# Patient Record
Sex: Female | Born: 1980
Health system: Southern US, Community
[De-identification: ages and names within clinical notes are randomized; demographics above are authoritative.]

## PROBLEM LIST (undated history)

## (undated) DIAGNOSIS — Z8614 Personal history of Methicillin resistant Staphylococcus aureus infection: Secondary | ICD-10-CM

## (undated) DIAGNOSIS — R112 Nausea with vomiting, unspecified: Secondary | ICD-10-CM

## (undated) DIAGNOSIS — Z9889 Other specified postprocedural states: Secondary | ICD-10-CM

## (undated) DIAGNOSIS — K3184 Gastroparesis: Secondary | ICD-10-CM

## (undated) DIAGNOSIS — G43909 Migraine, unspecified, not intractable, without status migrainosus: Secondary | ICD-10-CM

## (undated) DIAGNOSIS — K589 Irritable bowel syndrome without diarrhea: Secondary | ICD-10-CM

## (undated) DIAGNOSIS — K219 Gastro-esophageal reflux disease without esophagitis: Secondary | ICD-10-CM

## (undated) DIAGNOSIS — J45909 Unspecified asthma, uncomplicated: Secondary | ICD-10-CM

## (undated) HISTORY — DX: Unspecified asthma, uncomplicated: J45.909

## (undated) HISTORY — DX: Irritable bowel syndrome, unspecified: K58.9

## (undated) HISTORY — DX: Gastro-esophageal reflux disease without esophagitis: K21.9

## (undated) HISTORY — DX: Gastroparesis: K31.84

## (undated) HISTORY — DX: Personal history of Methicillin resistant Staphylococcus aureus infection: Z86.14

## (undated) HISTORY — DX: Migraine, unspecified, not intractable, without status migrainosus: G43.909

---

## 1999-07-21 ENCOUNTER — Emergency Department (HOSPITAL_COMMUNITY): Admission: EM | Admit: 1999-07-21 | Discharge: 1999-07-21 | Payer: Self-pay | Admitting: Emergency Medicine

## 1999-07-22 ENCOUNTER — Encounter: Payer: Self-pay | Admitting: Emergency Medicine

## 2001-02-22 ENCOUNTER — Emergency Department (HOSPITAL_COMMUNITY): Admission: EM | Admit: 2001-02-22 | Discharge: 2001-02-22 | Payer: Self-pay | Admitting: Emergency Medicine

## 2001-02-22 ENCOUNTER — Encounter: Payer: Self-pay | Admitting: Emergency Medicine

## 2001-02-23 ENCOUNTER — Ambulatory Visit (HOSPITAL_COMMUNITY): Admission: RE | Admit: 2001-02-23 | Discharge: 2001-02-23 | Payer: Self-pay | Admitting: Internal Medicine

## 2005-05-13 HISTORY — PX: GALLBLADDER SURGERY: SHX652

## 2007-10-29 ENCOUNTER — Emergency Department (HOSPITAL_COMMUNITY): Admission: EM | Admit: 2007-10-29 | Discharge: 2007-10-29 | Payer: Self-pay | Admitting: Emergency Medicine

## 2008-04-11 ENCOUNTER — Emergency Department (HOSPITAL_COMMUNITY): Admission: EM | Admit: 2008-04-11 | Discharge: 2008-04-11 | Payer: Self-pay | Admitting: *Deleted

## 2009-03-14 ENCOUNTER — Inpatient Hospital Stay (HOSPITAL_COMMUNITY): Admission: AD | Admit: 2009-03-14 | Discharge: 2009-03-17 | Payer: Self-pay | Admitting: Obstetrics and Gynecology

## 2009-04-21 ENCOUNTER — Observation Stay (HOSPITAL_COMMUNITY): Admission: AD | Admit: 2009-04-21 | Discharge: 2009-04-22 | Payer: Self-pay | Admitting: Obstetrics & Gynecology

## 2009-04-21 ENCOUNTER — Ambulatory Visit: Payer: Self-pay | Admitting: Obstetrics and Gynecology

## 2009-05-03 ENCOUNTER — Inpatient Hospital Stay (HOSPITAL_COMMUNITY): Admission: AD | Admit: 2009-05-03 | Discharge: 2009-05-06 | Payer: Self-pay | Admitting: Obstetrics & Gynecology

## 2010-08-13 LAB — CBC
HCT: 24.9 % — ABNORMAL LOW (ref 36.0–46.0)
HCT: 27.5 % — ABNORMAL LOW (ref 36.0–46.0)
HCT: 32.2 % — ABNORMAL LOW (ref 36.0–46.0)
Hemoglobin: 10.7 g/dL — ABNORMAL LOW (ref 12.0–15.0)
Hemoglobin: 8.1 g/dL — ABNORMAL LOW (ref 12.0–15.0)
Hemoglobin: 9 g/dL — ABNORMAL LOW (ref 12.0–15.0)
MCHC: 32.8 g/dL (ref 30.0–36.0)
MCHC: 32.8 g/dL (ref 30.0–36.0)
MCHC: 33.2 g/dL (ref 30.0–36.0)
MCV: 84.9 fL (ref 78.0–100.0)
MCV: 85.2 fL (ref 78.0–100.0)
MCV: 85.3 fL (ref 78.0–100.0)
Platelets: 269 10*3/uL (ref 150–400)
Platelets: 298 10*3/uL (ref 150–400)
Platelets: 345 10*3/uL (ref 150–400)
RBC: 2.92 MIL/uL — ABNORMAL LOW (ref 3.87–5.11)
RBC: 3.23 MIL/uL — ABNORMAL LOW (ref 3.87–5.11)
RBC: 3.78 MIL/uL — ABNORMAL LOW (ref 3.87–5.11)
RDW: 14.5 % (ref 11.5–15.5)
RDW: 14.8 % (ref 11.5–15.5)
RDW: 15.2 % (ref 11.5–15.5)
WBC: 17.8 10*3/uL — ABNORMAL HIGH (ref 4.0–10.5)
WBC: 19.9 10*3/uL — ABNORMAL HIGH (ref 4.0–10.5)
WBC: 22.2 10*3/uL — ABNORMAL HIGH (ref 4.0–10.5)

## 2010-08-13 LAB — COMPREHENSIVE METABOLIC PANEL
ALT: 12 U/L (ref 0–35)
AST: 19 U/L (ref 0–37)
Albumin: 2.3 g/dL — ABNORMAL LOW (ref 3.5–5.2)
Alkaline Phosphatase: 109 U/L (ref 39–117)
BUN: 3 mg/dL — ABNORMAL LOW (ref 6–23)
CO2: 24 mEq/L (ref 19–32)
Calcium: 8.9 mg/dL (ref 8.4–10.5)
Chloride: 103 mEq/L (ref 96–112)
Creatinine, Ser: 0.67 mg/dL (ref 0.4–1.2)
GFR calc Af Amer: >60 mL/min (ref >60)
GFR calc non Af Amer: >60 mL/min (ref >60)
Glucose, Bld: 132 mg/dL — ABNORMAL HIGH (ref 70–99)
Potassium: 4.2 mEq/L (ref 3.5–5.1)
Sodium: 133 mEq/L — ABNORMAL LOW (ref 135–145)
Total Bilirubin: 0.3 mg/dL (ref 0.3–1.2)
Total Protein: 5.5 g/dL — ABNORMAL LOW (ref 6.0–8.3)

## 2010-08-13 LAB — LACTATE DEHYDROGENASE: LDH: 130 U/L (ref 94–250)

## 2010-08-13 LAB — RPR: RPR Ser Ql: NONREACTIVE

## 2010-08-13 LAB — GLUCOSE, CAPILLARY: Glucose-Capillary: 118 mg/dL — ABNORMAL HIGH (ref 70–99)

## 2010-08-13 LAB — URIC ACID: Uric Acid, Serum: 4.9 mg/dL (ref 2.4–7.0)

## 2010-08-14 LAB — URINALYSIS, DIPSTICK ONLY
Bilirubin Urine: NEGATIVE
Glucose, UA: NEGATIVE mg/dL
Hgb urine dipstick: NEGATIVE
Ketones, ur: NEGATIVE mg/dL
Nitrite: NEGATIVE
Protein, ur: NEGATIVE mg/dL
Specific Gravity, Urine: <1.005 — ABNORMAL LOW (ref 1.005–1.030)
Urobilinogen, UA: 0.2 mg/dL (ref 0.0–1.0)
pH: 6.5 (ref 5.0–8.0)

## 2010-08-14 LAB — COMPREHENSIVE METABOLIC PANEL
ALT: 14 U/L (ref 0–35)
AST: 16 U/L (ref 0–37)
Albumin: 2.7 g/dL — ABNORMAL LOW (ref 3.5–5.2)
Alkaline Phosphatase: 125 U/L — ABNORMAL HIGH (ref 39–117)
BUN: 4 mg/dL — ABNORMAL LOW (ref 6–23)
CO2: 16 mEq/L — ABNORMAL LOW (ref 19–32)
Calcium: 9.4 mg/dL (ref 8.4–10.5)
Chloride: 103 mEq/L (ref 96–112)
Creatinine, Ser: 0.55 mg/dL (ref 0.4–1.2)
GFR calc Af Amer: >60 mL/min (ref >60)
GFR calc non Af Amer: >60 mL/min (ref >60)
Glucose, Bld: 102 mg/dL — ABNORMAL HIGH (ref 70–99)
Potassium: 3.8 mEq/L (ref 3.5–5.1)
Sodium: 131 mEq/L — ABNORMAL LOW (ref 135–145)
Total Bilirubin: 0.4 mg/dL (ref 0.3–1.2)
Total Protein: 7.2 g/dL (ref 6.0–8.3)

## 2010-08-14 LAB — CBC
HCT: 34 % — ABNORMAL LOW (ref 36.0–46.0)
Hemoglobin: 11.2 g/dL — ABNORMAL LOW (ref 12.0–15.0)
MCHC: 33 g/dL (ref 30.0–36.0)
MCV: 86.3 fL (ref 78.0–100.0)
Platelets: 322 10*3/uL (ref 150–400)
RBC: 3.93 MIL/uL (ref 3.87–5.11)
RDW: 14.8 % (ref 11.5–15.5)
WBC: 14.1 10*3/uL — ABNORMAL HIGH (ref 4.0–10.5)

## 2010-08-14 LAB — URINALYSIS, ROUTINE W REFLEX MICROSCOPIC
Glucose, UA: NEGATIVE mg/dL
Ketones, ur: >80 mg/dL — AB
Nitrite: NEGATIVE
Protein, ur: NEGATIVE mg/dL
Specific Gravity, Urine: 1.02 (ref 1.005–1.030)
Urobilinogen, UA: 0.2 mg/dL (ref 0.0–1.0)
pH: 6.5 (ref 5.0–8.0)

## 2010-08-14 LAB — BASIC METABOLIC PANEL
BUN: 1 mg/dL — ABNORMAL LOW (ref 6–23)
CO2: 21 mEq/L (ref 19–32)
Calcium: 8.6 mg/dL (ref 8.4–10.5)
Chloride: 109 mEq/L (ref 96–112)
Creatinine, Ser: 0.51 mg/dL (ref 0.4–1.2)
GFR calc Af Amer: >60 mL/min (ref >60)
GFR calc non Af Amer: >60 mL/min (ref >60)
Glucose, Bld: 110 mg/dL — ABNORMAL HIGH (ref 70–99)
Potassium: 3.5 mEq/L (ref 3.5–5.1)
Sodium: 136 mEq/L (ref 135–145)

## 2010-08-14 LAB — AMYLASE: Amylase: 23 U/L (ref 0–105)

## 2010-08-14 LAB — URINE MICROSCOPIC-ADD ON

## 2010-08-14 LAB — LIPASE, BLOOD: Lipase: 19 U/L (ref 11–59)

## 2010-08-15 LAB — GRAM STAIN

## 2010-08-15 LAB — COMPREHENSIVE METABOLIC PANEL
ALT: 15 U/L (ref 0–35)
AST: 15 U/L (ref 0–37)
Albumin: 2.5 g/dL — ABNORMAL LOW (ref 3.5–5.2)
Alkaline Phosphatase: 109 U/L (ref 39–117)
BUN: 2 mg/dL — ABNORMAL LOW (ref 6–23)
CO2: 20 mEq/L (ref 19–32)
Calcium: 9 mg/dL (ref 8.4–10.5)
Chloride: 108 mEq/L (ref 96–112)
Creatinine, Ser: 0.39 mg/dL — ABNORMAL LOW (ref 0.4–1.2)
GFR calc Af Amer: >60 mL/min (ref >60)
GFR calc non Af Amer: >60 mL/min (ref >60)
Glucose, Bld: 82 mg/dL (ref 70–99)
Potassium: 3.3 mEq/L — ABNORMAL LOW (ref 3.5–5.1)
Sodium: 137 mEq/L (ref 135–145)
Total Bilirubin: 0.3 mg/dL (ref 0.3–1.2)
Total Protein: 6.7 g/dL (ref 6.0–8.3)

## 2010-08-15 LAB — CBC
HCT: 27.8 % — ABNORMAL LOW (ref 36.0–46.0)
HCT: 32.1 % — ABNORMAL LOW (ref 36.0–46.0)
Hemoglobin: 10.6 g/dL — ABNORMAL LOW (ref 12.0–15.0)
Hemoglobin: 9.4 g/dL — ABNORMAL LOW (ref 12.0–15.0)
MCHC: 33.1 g/dL (ref 30.0–36.0)
MCHC: 33.7 g/dL (ref 30.0–36.0)
MCV: 89.5 fL (ref 78.0–100.0)
MCV: 89.6 fL (ref 78.0–100.0)
Platelets: 266 10*3/uL (ref 150–400)
Platelets: 306 10*3/uL (ref 150–400)
RBC: 3.11 MIL/uL — ABNORMAL LOW (ref 3.87–5.11)
RBC: 3.59 MIL/uL — ABNORMAL LOW (ref 3.87–5.11)
RDW: 14.4 % (ref 11.5–15.5)
RDW: 14.8 % (ref 11.5–15.5)
WBC: 12.9 10*3/uL — ABNORMAL HIGH (ref 4.0–10.5)
WBC: 17.4 10*3/uL — ABNORMAL HIGH (ref 4.0–10.5)

## 2010-08-15 LAB — URINALYSIS, ROUTINE W REFLEX MICROSCOPIC
Glucose, UA: NEGATIVE mg/dL
Ketones, ur: >80 mg/dL — AB
Leukocytes, UA: NEGATIVE
Nitrite: NEGATIVE
Protein, ur: 30 mg/dL — AB
Specific Gravity, Urine: 1.025 (ref 1.005–1.030)
Urobilinogen, UA: 1 mg/dL (ref 0.0–1.0)
pH: 6.5 (ref 5.0–8.0)

## 2010-08-15 LAB — TSH: TSH: 0.185 u[IU]/mL — ABNORMAL LOW (ref 0.350–4.500)

## 2010-08-15 LAB — URINALYSIS, DIPSTICK ONLY
Bilirubin Urine: NEGATIVE
Glucose, UA: NEGATIVE mg/dL
Hgb urine dipstick: NEGATIVE
Ketones, ur: 15 mg/dL — AB
Leukocytes, UA: NEGATIVE
Nitrite: NEGATIVE
Protein, ur: NEGATIVE mg/dL
Specific Gravity, Urine: 1.01 (ref 1.005–1.030)
Urobilinogen, UA: 1 mg/dL (ref 0.0–1.0)
pH: 7 (ref 5.0–8.0)

## 2010-08-15 LAB — URINE MICROSCOPIC-ADD ON

## 2010-08-15 LAB — VANCOMYCIN, PEAK: Vancomycin Pk: 22.5 ug/mL (ref 20–40)

## 2010-08-15 LAB — LIPASE, BLOOD: Lipase: 11 U/L (ref 11–59)

## 2010-08-15 LAB — AMYLASE: Amylase: 19 U/L — ABNORMAL LOW (ref 27–131)

## 2010-08-15 LAB — VANCOMYCIN, TROUGH: Vancomycin Tr: 5.8 ug/mL — ABNORMAL LOW (ref 10.0–20.0)

## 2010-08-15 LAB — T4, FREE: Free T4: 1.17 ng/dL (ref 0.80–1.80)

## 2010-09-28 NOTE — Procedures (Signed)
[facility]  Patient:    Megan Duran, Megan Duran Visit Number: [id number] MRN: [id number]          Service Type: END Location: ENDO Attending Physician:  Mervin Hack Dictated by:   Hedwig Morton. Juanda Chance, M.D. [facility] Admit Date:  02/23/2001   CC:         Gwen Pounds, M.D.   Procedure Report  PROCEDURE:  Upper endoscopy.  INDICATIONS:  This 30 year old white female presented to the emergency room last night with intractable nausea and vomiting.  She was previously hospitalized in [facility] last week for the same symptoms.  A CT scan of the abdomen was essentially negative.  She has been on Vioxx 25 mg a day. Her stool was Hemoccult negative and all of her chemistries were normal.  An ultrasound of the gallbladder was also normal.  She has been under a great deal of stress.  It is not clear whether this represents a functional GI problem or whether she has actually NSAID-related gastropathy or even peptic ulcer disease.  Therefore, she is undergoing upper endoscopy to assist with gastric outlet obstruction.  ENDOSCOPE:  Olympus single-channel video endoscope.  SEDATION:  Versed 5 mg IV and Demerol 40 mg IV.  FINDINGS:  Esophagus:  The Olympus single-channel video endoscope was passed under direct vision through the posterior pharynx into the esophagus.  The patient was monitored by pulse oximeter.  Oxygen saturations were normal.  She was quite cooperative.  Proximal and mixed esophageal mucosa were unremarkable.  The squamocolumnar junction was normal with mild erythema on the gastric side of the GE junction.  There was a small hiatal hernia measuring 2 cm, which was easily reducible.  Stomach:  The stomach was insufflated with air and showed patchy erythema in the gastric fundus most likely related to repeated vomiting.  There was no evidence of Mallory-Weiss tear, but the mucosa was patchy and showed inflammatory changes.  A CLOtest was  taken from the gastric antrum, which appeared normal.  The pyloric outlet was unremarkable.  There was no retained food or blood in the stomach.  Duodenum:  The duodenal bulb and descending duodenum were normal.  The endoscope scope was then retracted back to the stomach and retroflexed where the patchy erythema was confirmed by retroflexing the endoscope in new position.  IMPRESSION: 1. Gastritis, status post CLOtest. 2. Small reducible hiatal hernia.  PLAN: 1. Treat conservatively with bowel rest using full liquids. 2. Aciphex 20 mg p.o. b.i.d. x 1 week and then one p.o. q.d. 3. Donnatal tablets one p.o. b.i.d. 4. Continue Phenergan 12.5 mg prior to meals p.r.n. nausea. 5. She also has Lortab one p.o. p.r.n. severe abdominal pain. 6. If her symptoms continue, will schedule a small bowel follow through to    rule out small bowel obstruction or Crohns disease. Dictated by:   Hedwig Morton. Juanda Chance, M.D. [facility] Attending Physician:  Mervin Hack DD:  02/23/01 TD:  02/23/01 Job: [id number]

## 2011-06-10 ENCOUNTER — Telehealth: Payer: Self-pay | Admitting: *Deleted

## 2011-06-10 NOTE — Telephone Encounter (Signed)
Pt informed of lab results. 

## 2014-04-05 ENCOUNTER — Other Ambulatory Visit: Payer: Self-pay | Admitting: Allergy and Immunology

## 2014-04-05 DIAGNOSIS — J329 Chronic sinusitis, unspecified: Secondary | ICD-10-CM

## 2014-04-06 ENCOUNTER — Encounter (HOSPITAL_COMMUNITY): Payer: Self-pay

## 2014-04-06 ENCOUNTER — Ambulatory Visit (HOSPITAL_COMMUNITY)
Admission: RE | Admit: 2014-04-06 | Discharge: 2014-04-06 | Disposition: A | Discharge status: Home / Self Care | Payer: 59 | Source: Ambulatory Visit | Attending: Allergy and Immunology | Admitting: Allergy and Immunology

## 2014-04-06 DIAGNOSIS — J329 Chronic sinusitis, unspecified: Secondary | ICD-10-CM | POA: Insufficient documentation

## 2015-08-07 DIAGNOSIS — J209 Acute bronchitis, unspecified: Secondary | ICD-10-CM | POA: Diagnosis not present

## 2015-08-07 DIAGNOSIS — J0100 Acute maxillary sinusitis, unspecified: Secondary | ICD-10-CM | POA: Diagnosis not present

## 2015-08-07 DIAGNOSIS — R51 Headache: Secondary | ICD-10-CM | POA: Diagnosis not present

## 2015-08-10 ENCOUNTER — Encounter: Payer: Self-pay | Admitting: Allergy and Immunology

## 2015-08-10 ENCOUNTER — Ambulatory Visit (INDEPENDENT_AMBULATORY_CARE_PROVIDER_SITE_OTHER): Payer: 59 | Admitting: Allergy and Immunology

## 2015-08-10 VITALS — BP 122/84 | HR 76 | Temp 98.4°F | Resp 16 | Ht 65.35 in | Wt 240.7 lb

## 2015-08-10 DIAGNOSIS — K219 Gastro-esophageal reflux disease without esophagitis: Secondary | ICD-10-CM

## 2015-08-10 DIAGNOSIS — J309 Allergic rhinitis, unspecified: Secondary | ICD-10-CM

## 2015-08-10 DIAGNOSIS — J019 Acute sinusitis, unspecified: Secondary | ICD-10-CM

## 2015-08-10 DIAGNOSIS — J4541 Moderate persistent asthma with (acute) exacerbation: Secondary | ICD-10-CM | POA: Diagnosis not present

## 2015-08-10 DIAGNOSIS — J387 Other diseases of larynx: Secondary | ICD-10-CM

## 2015-08-10 DIAGNOSIS — H1010 Acute atopic conjunctivitis, unspecified eye: Secondary | ICD-10-CM

## 2015-08-10 MED ORDER — FLUTICASONE PROPIONATE 50 MCG/ACT NA SUSP: 1.0000 | Freq: Two times a day (BID) | NASAL | Status: DC

## 2015-08-10 MED ORDER — METHYLPREDNISOLONE ACETATE 80 MG/ML IJ SUSP
80.0000 mg | Freq: Once | INTRAMUSCULAR | Status: AC
  Administered 2015-08-10: 80 mg via INTRAMUSCULAR

## 2015-08-10 MED ORDER — AMOXICILLIN-POT CLAVULANATE 875-125 MG PO TABS: ORAL_TABLET | ORAL | Status: DC

## 2015-08-10 MED ORDER — LEVALBUTEROL TARTRATE 45 MCG/ACT IN AERO: 2.0000 | INHALATION_SPRAY | Freq: Four times a day (QID) | RESPIRATORY_TRACT | Status: DC | PRN

## 2015-08-10 NOTE — Patient Instructions (Addendum)
  1. Depo-Medrol 80 IM delivered in clinic today  2. Augmentin 875 one tablet twice a day for the next 14 days  3. Nasal saline multiple times per day while sick  4. Continue Symbicort 160 2 inhalations twice a day  5. Continue Flonase one spray each nostril twice a day  6. Continue ranitidine 300 mg once a day   7. Continue Zyrtec and Xopenex if needed  8. Return to clinic in 6 months or earlier if problem

## 2015-08-10 NOTE — Progress Notes (Signed)
Follow-up Note  Referring Provider: Reggy EyeKozlow, Ayanna James, Demetrius CharityP* Primary Provider: Reggy EyeKOZLOW, AYANNA JAMES, PA-C Date of Office Visit: 08/10/2015  Subjective:   Megan Duran (DOB: 01/29/1981) is a 35 y.o. female [facility] in re-evaluation of the following:  HPI Comments: Megan Duran returns to this clinic in reevaluation of her asthma and allergic rhinitis and reflux-induced respiratory disease. I've not seen her in his clinic since July 2016.  During the interval she is done quite well with her asthma without any need to use a systemic steroid for an exacerbation, minimal requirement for a short acting bronchodilator, and no interference with ability to exercise, while consistently using her Symbicort 2 inhalations twice a day. As well, her nose is been doing quite well while using Flonase on a relatively consistent basis and her reflux been under pretty good control while using her Zantac daily.  Unfortunately, about 2 weeks ago she developed significant nasal congestion and ugly nasal discharge and anosmia and headache and now has developed cough and congestion in her chest. Her head issue is not improving. Apparently she was exposed to influenza from her daughter and she did use Tamiflu about 4-6 weeks ago.     Medication List           budesonide-formoterol 160-4.5 MCG/ACT inhaler  Commonly known as:  SYMBICORT  Inhale 2 puffs into the lungs 2 (two) times daily.     DAYQUIL MULTI-SYMPTOM COLD/FLU PO  Take by mouth as needed.     fluticasone 50 MCG/ACT nasal spray  Commonly known as:  FLONASE  Place 1 spray into both nostrils 2 (two) times daily.     levalbuterol 45 MCG/ACT inhaler  Commonly known as:  XOPENEX HFA  Inhale 2 puffs into the lungs every 6 (six) hours as needed for wheezing.     loratadine 10 MG tablet  Commonly known as:  CLARITIN  Take 10 mg by mouth daily as needed for allergies.     ranitidine 300 MG  tablet  Commonly known as:  ZANTAC  Take 300 mg by mouth daily.        Past Medical History  Diagnosis Date  . Asthma   . GERD (gastroesophageal reflux disease)   . Migraine   . IBS (irritable bowel syndrome)   . History of MRSA infection     Past Surgical History  Procedure Laterality Date  . Gallbladder surgery  2007    Allergies  Allergen Reactions  . Bactrim [Sulfamethoxazole-Trimethoprim] Hives  . Biaxin [Clarithromycin] Hives  . Doxycycline Hives  . Effexor [Venlafaxine] Hives  . Erythromycin Hives  . Imitrex [Sumatriptan] Hives  . Keflex [Cephalexin] Hives  . Minocycline Hives    Review of systems negative except as noted in HPI / PMHx or noted below:  Review of Systems  Constitutional: Negative.   HENT: Negative.   Eyes: Negative.   Respiratory: Negative.   Cardiovascular: Negative.   Gastrointestinal: Negative.   Genitourinary: Negative.   Musculoskeletal: Negative.   Skin: Negative.   Neurological: Negative.   Endo/Heme/Allergies: Negative.   Psychiatric/Behavioral: Negative.      Objective:   Filed Vitals:   08/10/15 1009  BP: 122/84  Pulse: 76  Temp: 98.4 F (36.9 C)  Resp: 16   Height: 5' 5.35" (166 cm)  Weight: 240 lb 11.9 oz (109.2 kg)   Physical Exam  Constitutional: She is well-developed, well-nourished, and in no distress.  Coughing, nasal voice  HENT:  Head: Normocephalic.  Right Ear: Tympanic membrane, external ear and ear canal normal.  Left Ear: Tympanic membrane, external ear and ear canal normal.  Nose: Mucosal edema (Erythematous) present. No rhinorrhea.  Mouth/Throat: Uvula is midline, oropharynx is clear and moist and mucous membranes are normal. No oropharyngeal exudate.  Eyes: Conjunctivae are normal.  Neck: Trachea normal. No tracheal tenderness present. No tracheal deviation present. No thyromegaly present.  Cardiovascular: Normal rate, regular rhythm, S1 normal, S2 normal and normal heart sounds.   No murmur  heard. Pulmonary/Chest: No stridor. No respiratory distress. She has wheezes (End expiratory wheezing on forced expiration bilaterally). She has no rales.  Musculoskeletal: She exhibits no edema.  Lymphadenopathy:       Head (right side): No tonsillar adenopathy present.       Head (left side): No tonsillar adenopathy present.    She has no cervical adenopathy.    She has no axillary adenopathy.  Neurological: She is alert. Gait normal.  Skin: No rash noted. She is not diaphoretic. No erythema. Nails show no clubbing.  Psychiatric: Mood and affect normal.    Diagnostics:    Spirometry was performed and demonstrated an FEV1 of 2.43 at 74 % of predicted.  The patient had an Asthma Control Test with the following results: ACT Total Score: 16.    Assessment and Plan:   1. Asthma, not well controlled, moderate persistent, with acute exacerbation   2. Allergic rhinoconjunctivitis   3. LPRD (laryngopharyngeal reflux disease)   4. Acute sinusitis, recurrence not specified, unspecified location     1. Depo-Medrol 80 IM delivered in clinic today  2. Augmentin 875 one tablet twice a day for the next 14 days  3. Nasal saline multiple times per day while sick  4. Continue Symbicort 162 inhalations twice a day  5. Continue Flonase one spray shot also twice a day  6. Continue ranitidine 300 mg once a day   7. Continue Zyrtec and Xopenex if needed  8. Return to clinic in 6 months or earlier if problem  I will assume that Daphyne will respond favorably to the medical therapy noted above which includes anti-inflammatory medications to get rid of her respiratory tract inflammation and antibiotics to treat what is probably a secondary bacterial infection from her influenza infection. We'll see if things go over the course of the next several weeks. She will keep in contact with me noting her response and will make a decision about how to proceed pending his response. If she does well I will  see her back in this clinic in 6 months or earlier if there is a problem.  Laurette Schimke, MD [facility]

## 2015-08-16 ENCOUNTER — Telehealth: Payer: Self-pay | Admitting: Allergy and Immunology

## 2015-08-16 NOTE — Telephone Encounter (Signed)
Please inform patient that instead of using zofran, stop zofran, let stomach settle, and we will get her another antibiotic when she is better. Not a good practice to continue to use an antibiotic that upsets gi tract.

## 2015-08-16 NOTE — Telephone Encounter (Signed)
Needs zofran called in to take with her antibiotics. She is getting sick on the antibiotics.  [facility] in [place]

## 2015-08-16 NOTE — Telephone Encounter (Signed)
Patient informed. 

## 2015-08-18 ENCOUNTER — Telehealth: Payer: Self-pay | Admitting: *Deleted

## 2015-08-18 NOTE — Telephone Encounter (Signed)
Patient is aware that Dr. Lucie LeatherKozlow is not in the office today but will be back Monday and will address this then.

## 2015-08-18 NOTE — Telephone Encounter (Signed)
PT STATED THAT DR.K REQUESTED THAT [name] CALL AND LET HIM KNOW WHEN HER STOMACH WAS FEELING BETTER. SHE SAYS HER STOMACH IS BETTER AND NOW SHE IS READY TO BE PUT BACK ON ANTIBIOTICS FOR BRONCHITIS. PATIENT STATES THAT A LOT OF ANTIBIOTICS WILL UPSET HER STOMACH AND IT IS NOT UNCOMMON FOR HER TO HAVE TO TAKE ZOFRAN ALONG WITH HER ANTIBIOTICS. PT WANTS TO KNOW IF YOU CAN CALL HER IN SOMETHING ALONG WITH ZOFRAN

## 2015-08-21 ENCOUNTER — Other Ambulatory Visit: Payer: Self-pay | Admitting: *Deleted

## 2015-08-21 ENCOUNTER — Ambulatory Visit: Payer: 59 | Admitting: Allergy and Immunology

## 2015-08-21 MED ORDER — ONDANSETRON 4 MG PREPACK (~~LOC~~): 1.0000 | ORAL_TABLET | Freq: Three times a day (TID) | ORAL | Status: DC | PRN

## 2015-08-21 MED ORDER — ONDANSETRON HCL 4 MG PO TABS: 4.0000 mg | ORAL_TABLET | Freq: Three times a day (TID) | ORAL | Status: DC | PRN

## 2015-08-21 MED ORDER — CLINDAMYCIN HCL 150 MG PO CAPS: 150.0000 mg | ORAL_CAPSULE | Freq: Three times a day (TID) | ORAL | Status: DC

## 2015-08-21 NOTE — Telephone Encounter (Signed)
PATIENT ADVISED OF RX AND INSTRUCTIONS, RX SENT

## 2015-08-21 NOTE — Telephone Encounter (Signed)
Please provide patient with clindamycin 150 tab three times a day for 14 days and zofran 4mg  tab every 8 hours as needed, # 30

## 2015-11-01 DIAGNOSIS — J0100 Acute maxillary sinusitis, unspecified: Secondary | ICD-10-CM | POA: Diagnosis not present

## 2015-12-20 DIAGNOSIS — K219 Gastro-esophageal reflux disease without esophagitis: Secondary | ICD-10-CM | POA: Diagnosis not present

## 2015-12-20 DIAGNOSIS — M159 Polyosteoarthritis, unspecified: Secondary | ICD-10-CM | POA: Insufficient documentation

## 2015-12-20 DIAGNOSIS — K589 Irritable bowel syndrome without diarrhea: Secondary | ICD-10-CM | POA: Insufficient documentation

## 2015-12-20 DIAGNOSIS — J45909 Unspecified asthma, uncomplicated: Secondary | ICD-10-CM | POA: Insufficient documentation

## 2015-12-20 DIAGNOSIS — D649 Anemia, unspecified: Secondary | ICD-10-CM | POA: Insufficient documentation

## 2015-12-20 DIAGNOSIS — K3184 Gastroparesis: Secondary | ICD-10-CM | POA: Insufficient documentation

## 2015-12-20 DIAGNOSIS — G43909 Migraine, unspecified, not intractable, without status migrainosus: Secondary | ICD-10-CM | POA: Insufficient documentation

## 2015-12-20 DIAGNOSIS — J329 Chronic sinusitis, unspecified: Secondary | ICD-10-CM | POA: Insufficient documentation

## 2015-12-20 DIAGNOSIS — G43919 Migraine, unspecified, intractable, without status migrainosus: Secondary | ICD-10-CM | POA: Diagnosis not present

## 2015-12-20 DIAGNOSIS — J0140 Acute pansinusitis, unspecified: Secondary | ICD-10-CM | POA: Diagnosis not present

## 2015-12-20 DIAGNOSIS — J4520 Mild intermittent asthma, uncomplicated: Secondary | ICD-10-CM | POA: Diagnosis not present

## 2015-12-20 DIAGNOSIS — R5383 Other fatigue: Secondary | ICD-10-CM | POA: Diagnosis not present

## 2015-12-20 DIAGNOSIS — R5381 Other malaise: Secondary | ICD-10-CM | POA: Diagnosis not present

## 2015-12-22 DIAGNOSIS — E559 Vitamin D deficiency, unspecified: Secondary | ICD-10-CM | POA: Insufficient documentation

## 2015-12-22 DIAGNOSIS — E611 Iron deficiency: Secondary | ICD-10-CM | POA: Insufficient documentation

## 2016-01-23 DIAGNOSIS — R51 Headache: Secondary | ICD-10-CM | POA: Diagnosis not present

## 2016-01-23 DIAGNOSIS — J209 Acute bronchitis, unspecified: Secondary | ICD-10-CM | POA: Diagnosis not present

## 2016-01-23 DIAGNOSIS — J0100 Acute maxillary sinusitis, unspecified: Secondary | ICD-10-CM | POA: Diagnosis not present

## 2016-02-01 DIAGNOSIS — J4520 Mild intermittent asthma, uncomplicated: Secondary | ICD-10-CM | POA: Diagnosis not present

## 2016-02-01 DIAGNOSIS — R0602 Shortness of breath: Secondary | ICD-10-CM | POA: Diagnosis not present

## 2016-02-01 DIAGNOSIS — E559 Vitamin D deficiency, unspecified: Secondary | ICD-10-CM | POA: Diagnosis not present

## 2016-02-01 DIAGNOSIS — R05 Cough: Secondary | ICD-10-CM | POA: Diagnosis not present

## 2016-02-01 DIAGNOSIS — R002 Palpitations: Secondary | ICD-10-CM | POA: Diagnosis not present

## 2016-02-01 DIAGNOSIS — E611 Iron deficiency: Secondary | ICD-10-CM | POA: Diagnosis not present

## 2016-02-01 DIAGNOSIS — R110 Nausea: Secondary | ICD-10-CM | POA: Diagnosis not present

## 2016-02-07 DIAGNOSIS — R000 Tachycardia, unspecified: Secondary | ICD-10-CM | POA: Diagnosis not present

## 2016-03-01 DIAGNOSIS — R002 Palpitations: Secondary | ICD-10-CM | POA: Diagnosis not present

## 2016-03-04 DIAGNOSIS — R002 Palpitations: Secondary | ICD-10-CM | POA: Diagnosis not present

## 2016-03-11 DIAGNOSIS — Z124 Encounter for screening for malignant neoplasm of cervix: Secondary | ICD-10-CM | POA: Diagnosis not present

## 2016-03-11 DIAGNOSIS — Z01419 Encounter for gynecological examination (general) (routine) without abnormal findings: Secondary | ICD-10-CM | POA: Diagnosis not present

## 2016-03-11 DIAGNOSIS — Z6841 Body Mass Index (BMI) 40.0 and over, adult: Secondary | ICD-10-CM | POA: Diagnosis not present

## 2016-03-11 DIAGNOSIS — N926 Irregular menstruation, unspecified: Secondary | ICD-10-CM | POA: Diagnosis not present

## 2016-03-25 DIAGNOSIS — N926 Irregular menstruation, unspecified: Secondary | ICD-10-CM | POA: Diagnosis not present

## 2016-03-25 DIAGNOSIS — Z1231 Encounter for screening mammogram for malignant neoplasm of breast: Secondary | ICD-10-CM | POA: Diagnosis not present

## 2016-04-08 DIAGNOSIS — J0100 Acute maxillary sinusitis, unspecified: Secondary | ICD-10-CM | POA: Diagnosis not present

## 2016-04-08 DIAGNOSIS — H6692 Otitis media, unspecified, left ear: Secondary | ICD-10-CM | POA: Diagnosis not present

## 2016-05-13 NOTE — L&D Delivery Note (Signed)
Patient was C/C/+2 and pushed for approx 15 minutes with epidural.   NSVD  female infant, Apgars 8/9, weight pending.   The patient had 2nd degree perineal laceration and Right labial tear repaired with 2-0 vicryl. Fundus was firm. EBL was expected amount. Placenta was delivered intact. Vagina was clear.  Baby was vigorous and doing skin to skin with mother.  Megan Duran, Megan Duran

## 2016-06-02 DIAGNOSIS — J209 Acute bronchitis, unspecified: Secondary | ICD-10-CM | POA: Diagnosis not present

## 2016-06-19 DIAGNOSIS — Z3161 Procreative counseling and advice using natural family planning: Secondary | ICD-10-CM | POA: Diagnosis not present

## 2016-06-19 DIAGNOSIS — E288 Other ovarian dysfunction: Secondary | ICD-10-CM | POA: Diagnosis not present

## 2016-06-19 DIAGNOSIS — Z319 Encounter for procreative management, unspecified: Secondary | ICD-10-CM | POA: Diagnosis not present

## 2016-06-19 MED FILL — LETROZOLE 2.5 MG TABLET: 2.5 | 30 days supply | Qty: 15 | Fill #0

## 2016-06-19 MED FILL — OVIDREL 250 MCG/0.5 ML SYRG: 250 | 14 days supply | Qty: 1 | Fill #0

## 2016-06-20 DIAGNOSIS — O021 Missed abortion: Secondary | ICD-10-CM | POA: Diagnosis not present

## 2016-06-21 DIAGNOSIS — Z319 Encounter for procreative management, unspecified: Secondary | ICD-10-CM | POA: Diagnosis not present

## 2016-06-25 DIAGNOSIS — Z319 Encounter for procreative management, unspecified: Secondary | ICD-10-CM | POA: Diagnosis not present

## 2016-07-15 MED FILL — LETROZOLE 2.5 MG TABLET: 2.5 | 30 days supply | Qty: 15 | Fill #1

## 2016-07-15 MED FILL — OVIDREL 250 MCG/0.5 ML SYRG: 250 | 14 days supply | Qty: 1 | Fill #1

## 2016-07-23 DIAGNOSIS — Z319 Encounter for procreative management, unspecified: Secondary | ICD-10-CM | POA: Diagnosis not present

## 2016-07-25 DIAGNOSIS — E559 Vitamin D deficiency, unspecified: Secondary | ICD-10-CM | POA: Diagnosis not present

## 2016-07-25 DIAGNOSIS — J329 Chronic sinusitis, unspecified: Secondary | ICD-10-CM | POA: Diagnosis not present

## 2016-07-25 DIAGNOSIS — J0140 Acute pansinusitis, unspecified: Secondary | ICD-10-CM | POA: Diagnosis not present

## 2016-07-25 DIAGNOSIS — J4520 Mild intermittent asthma, uncomplicated: Secondary | ICD-10-CM | POA: Diagnosis not present

## 2016-07-25 DIAGNOSIS — E611 Iron deficiency: Secondary | ICD-10-CM | POA: Diagnosis not present

## 2016-08-19 DIAGNOSIS — Z3200 Encounter for pregnancy test, result unknown: Secondary | ICD-10-CM | POA: Diagnosis not present

## 2016-08-22 DIAGNOSIS — Z3200 Encounter for pregnancy test, result unknown: Secondary | ICD-10-CM | POA: Diagnosis not present

## 2016-09-04 DIAGNOSIS — O0900 Supervision of pregnancy with history of infertility, unspecified trimester: Secondary | ICD-10-CM | POA: Diagnosis not present

## 2016-09-17 DIAGNOSIS — Z3490 Encounter for supervision of normal pregnancy, unspecified, unspecified trimester: Secondary | ICD-10-CM | POA: Diagnosis not present

## 2016-09-17 DIAGNOSIS — N925 Other specified irregular menstruation: Secondary | ICD-10-CM | POA: Diagnosis not present

## 2016-09-17 DIAGNOSIS — O09521 Supervision of elderly multigravida, first trimester: Secondary | ICD-10-CM | POA: Diagnosis not present

## 2016-09-17 LAB — OB RESULTS CONSOLE RUBELLA ANTIBODY, IGM: Rubella: IMMUNE

## 2016-09-17 LAB — OB RESULTS CONSOLE ABO/RH: RH Type: POSITIVE

## 2016-09-17 LAB — OB RESULTS CONSOLE HIV ANTIBODY (ROUTINE TESTING): HIV: NONREACTIVE

## 2016-09-17 LAB — OB RESULTS CONSOLE ANTIBODY SCREEN: Antibody Screen: NEGATIVE

## 2016-09-17 LAB — OB RESULTS CONSOLE RPR: RPR: NONREACTIVE

## 2016-09-17 LAB — OB RESULTS CONSOLE GC/CHLAMYDIA
Chlamydia: NEGATIVE
Gonorrhea: NEGATIVE

## 2016-09-17 LAB — OB RESULTS CONSOLE HEPATITIS B SURFACE ANTIGEN: Hepatitis B Surface Ag: NEGATIVE

## 2016-09-20 DIAGNOSIS — J0100 Acute maxillary sinusitis, unspecified: Secondary | ICD-10-CM | POA: Diagnosis not present

## 2016-09-24 DIAGNOSIS — Z3490 Encounter for supervision of normal pregnancy, unspecified, unspecified trimester: Secondary | ICD-10-CM | POA: Insufficient documentation

## 2016-11-21 DIAGNOSIS — Z363 Encounter for antenatal screening for malformations: Secondary | ICD-10-CM | POA: Diagnosis not present

## 2016-12-24 DIAGNOSIS — Z362 Encounter for other antenatal screening follow-up: Secondary | ICD-10-CM | POA: Diagnosis not present

## 2016-12-24 DIAGNOSIS — Z369 Encounter for antenatal screening, unspecified: Secondary | ICD-10-CM | POA: Diagnosis not present

## 2017-01-08 DIAGNOSIS — J0100 Acute maxillary sinusitis, unspecified: Secondary | ICD-10-CM | POA: Diagnosis not present

## 2017-01-21 DIAGNOSIS — Z23 Encounter for immunization: Secondary | ICD-10-CM | POA: Diagnosis not present

## 2017-01-21 DIAGNOSIS — Z3480 Encounter for supervision of other normal pregnancy, unspecified trimester: Secondary | ICD-10-CM | POA: Diagnosis not present

## 2017-01-28 DIAGNOSIS — O99810 Abnormal glucose complicating pregnancy: Secondary | ICD-10-CM | POA: Diagnosis not present

## 2017-02-28 DIAGNOSIS — Z3483 Encounter for supervision of other normal pregnancy, third trimester: Secondary | ICD-10-CM | POA: Diagnosis not present

## 2017-02-28 DIAGNOSIS — Z3482 Encounter for supervision of other normal pregnancy, second trimester: Secondary | ICD-10-CM | POA: Diagnosis not present

## 2017-03-04 DIAGNOSIS — O26849 Uterine size-date discrepancy, unspecified trimester: Secondary | ICD-10-CM | POA: Diagnosis not present

## 2017-03-12 DIAGNOSIS — J0100 Acute maxillary sinusitis, unspecified: Secondary | ICD-10-CM | POA: Diagnosis not present

## 2017-03-19 DIAGNOSIS — Z3480 Encounter for supervision of other normal pregnancy, unspecified trimester: Secondary | ICD-10-CM | POA: Diagnosis not present

## 2017-03-19 DIAGNOSIS — Z369 Encounter for antenatal screening, unspecified: Secondary | ICD-10-CM | POA: Diagnosis not present

## 2017-03-19 LAB — OB RESULTS CONSOLE GBS: GBS: NEGATIVE

## 2017-03-27 DIAGNOSIS — O09523 Supervision of elderly multigravida, third trimester: Secondary | ICD-10-CM | POA: Diagnosis not present

## 2017-03-31 ENCOUNTER — Other Ambulatory Visit: Payer: Self-pay

## 2017-03-31 ENCOUNTER — Encounter (HOSPITAL_COMMUNITY): Payer: Self-pay | Admitting: Emergency Medicine

## 2017-03-31 ENCOUNTER — Inpatient Hospital Stay (HOSPITAL_COMMUNITY)
Admission: AD | Admit: 2017-03-31 | Discharge: 2017-04-03 | DRG: 807 | Disposition: A | Discharge status: Home / Self Care | Payer: 59 | Source: Ambulatory Visit | Attending: Obstetrics and Gynecology | Admitting: Obstetrics and Gynecology

## 2017-03-31 ENCOUNTER — Encounter (HOSPITAL_COMMUNITY): Payer: Self-pay | Admitting: *Deleted

## 2017-03-31 ENCOUNTER — Inpatient Hospital Stay (HOSPITAL_COMMUNITY)
Admission: AD | Admit: 2017-03-31 | Discharge: 2017-03-31 | Disposition: A | Discharge status: Home / Self Care | Payer: 59 | Source: Ambulatory Visit | Attending: Obstetrics and Gynecology | Admitting: Obstetrics and Gynecology

## 2017-03-31 DIAGNOSIS — Z3A37 37 weeks gestation of pregnancy: Secondary | ICD-10-CM

## 2017-03-31 DIAGNOSIS — O99214 Obesity complicating childbirth: Secondary | ICD-10-CM | POA: Diagnosis present

## 2017-03-31 DIAGNOSIS — O9962 Diseases of the digestive system complicating childbirth: Secondary | ICD-10-CM | POA: Diagnosis present

## 2017-03-31 DIAGNOSIS — Z3483 Encounter for supervision of other normal pregnancy, third trimester: Secondary | ICD-10-CM | POA: Diagnosis present

## 2017-03-31 DIAGNOSIS — K219 Gastro-esophageal reflux disease without esophagitis: Secondary | ICD-10-CM | POA: Diagnosis present

## 2017-03-31 DIAGNOSIS — O9952 Diseases of the respiratory system complicating childbirth: Principal | ICD-10-CM | POA: Diagnosis present

## 2017-03-31 DIAGNOSIS — O479 False labor, unspecified: Secondary | ICD-10-CM

## 2017-03-31 DIAGNOSIS — J45909 Unspecified asthma, uncomplicated: Secondary | ICD-10-CM | POA: Diagnosis present

## 2017-03-31 DIAGNOSIS — Z8614 Personal history of Methicillin resistant Staphylococcus aureus infection: Secondary | ICD-10-CM | POA: Diagnosis not present

## 2017-03-31 LAB — CBC
HCT: 34.5 % — ABNORMAL LOW (ref 36.0–46.0)
Hemoglobin: 11 g/dL — ABNORMAL LOW (ref 12.0–15.0)
MCH: 29.2 pg (ref 26.0–34.0)
MCHC: 31.9 g/dL (ref 30.0–36.0)
MCV: 91.5 fL (ref 78.0–100.0)
Platelets: 226 10*3/uL (ref 150–400)
RBC: 3.77 MIL/uL — ABNORMAL LOW (ref 3.87–5.11)
RDW: 14.7 % (ref 11.5–15.5)
WBC: 12.2 10*3/uL — ABNORMAL HIGH (ref 4.0–10.5)

## 2017-03-31 LAB — POCT FERN TEST
POCT Fern Test: NEGATIVE
POCT Fern Test: POSITIVE

## 2017-03-31 LAB — TYPE AND SCREEN
ABO/RH(D): A POS
Antibody Screen: NEGATIVE

## 2017-03-31 MED ORDER — OXYCODONE-ACETAMINOPHEN 5-325 MG PO TABS: 2.0000 | ORAL_TABLET | ORAL | Status: DC | PRN

## 2017-03-31 MED ORDER — SOD CITRATE-CITRIC ACID 500-334 MG/5ML PO SOLN
30.0000 mL | ORAL | Status: DC | PRN
Start: 2017-03-31 — End: 2017-04-01

## 2017-03-31 MED ORDER — OXYTOCIN BOLUS FROM INFUSION
500.0000 mL | Freq: Once | INTRAVENOUS | Status: AC
  Administered 2017-04-01: 500 mL via INTRAVENOUS

## 2017-03-31 MED ORDER — LACTATED RINGERS IV SOLN
INTRAVENOUS | Status: DC
  Administered 2017-03-31: 22:00:00 via INTRAVENOUS

## 2017-03-31 MED ORDER — TERBUTALINE SULFATE 1 MG/ML IJ SOLN
0.2500 mg | Freq: Once | INTRAMUSCULAR | Status: DC | PRN
  Filled 2017-03-31: qty 1

## 2017-03-31 MED ORDER — OXYCODONE-ACETAMINOPHEN 5-325 MG PO TABS
1.0000 | ORAL_TABLET | ORAL | Status: DC | PRN
Start: 2017-03-31 — End: 2017-04-01

## 2017-03-31 MED ORDER — ACETAMINOPHEN 325 MG PO TABS: 650.0000 mg | ORAL_TABLET | ORAL | Status: DC | PRN

## 2017-03-31 MED ORDER — ONDANSETRON HCL 4 MG/2ML IJ SOLN: 4.0000 mg | Freq: Four times a day (QID) | INTRAMUSCULAR | Status: DC | PRN

## 2017-03-31 MED ORDER — FLEET ENEMA 7-19 GM/118ML RE ENEM: 1.0000 | ENEMA | RECTAL | Status: DC | PRN

## 2017-03-31 MED ORDER — LACTATED RINGERS IV SOLN: 500.0000 mL | INTRAVENOUS | Status: DC | PRN

## 2017-03-31 MED ORDER — OXYTOCIN 40 UNITS IN LACTATED RINGERS INFUSION - SIMPLE MED
1.0000 m[IU]/min | INTRAVENOUS | Status: DC
  Administered 2017-03-31: 2 m[IU]/min via INTRAVENOUS
  Filled 2017-03-31: qty 1000

## 2017-03-31 MED ORDER — OXYTOCIN 40 UNITS IN LACTATED RINGERS INFUSION - SIMPLE MED: 2.5000 [IU]/h | INTRAVENOUS | Status: DC

## 2017-03-31 MED ORDER — LIDOCAINE HCL (PF) 1 % IJ SOLN
30.0000 mL | INTRAMUSCULAR | Status: DC | PRN
  Administered 2017-04-01: 30 mL via SUBCUTANEOUS
  Filled 2017-03-31: qty 30

## 2017-03-31 NOTE — Progress Notes (Signed)
D/c instructions reviewed, given/signed. Pt denies ques/concerns.  D/c home in stable condition with husband.

## 2017-03-31 NOTE — Progress Notes (Signed)
Called Dr Claiborne Billingsallahan, report given re: pt complaint, background, reactive NST, & SVEs.  Orders rec'd to ask MAU provider to write d/c order.  Report given to Lunette StandsE Lawerence, CNM

## 2017-03-31 NOTE — MAU Note (Signed)
Pt presents with c/o ctxs every 20 minutes since last night.  Denies Vb.  Reports "gush" of fluid @ 1100, clear.  Reports +FM.

## 2017-03-31 NOTE — MAU Note (Signed)
Pt reports that her water broke tonight at 7:45pm-clear fluid with some blood. Reports contractions every 12 mins. Good fetal movement.

## 2017-04-01 ENCOUNTER — Inpatient Hospital Stay (HOSPITAL_COMMUNITY): Payer: 59 | Admitting: Anesthesiology

## 2017-04-01 ENCOUNTER — Encounter (HOSPITAL_COMMUNITY): Payer: Self-pay

## 2017-04-01 LAB — RPR: RPR Ser Ql: NONREACTIVE

## 2017-04-01 LAB — ABO/RH: ABO/RH(D): A POS

## 2017-04-01 MED ORDER — ZOLPIDEM TARTRATE 5 MG PO TABS: 5.0000 mg | ORAL_TABLET | Freq: Every evening | ORAL | Status: DC | PRN

## 2017-04-01 MED ORDER — TETANUS-DIPHTH-ACELL PERTUSSIS 5-2.5-18.5 LF-MCG/0.5 IM SUSP: 0.5000 mL | Freq: Once | INTRAMUSCULAR | Status: DC

## 2017-04-01 MED ORDER — SENNOSIDES-DOCUSATE SODIUM 8.6-50 MG PO TABS
2.0000 | ORAL_TABLET | ORAL | Status: DC
  Administered 2017-04-03: 2 via ORAL
  Filled 2017-04-01 (×2): qty 2

## 2017-04-01 MED ORDER — OXYCODONE-ACETAMINOPHEN 5-325 MG PO TABS: 2.0000 | ORAL_TABLET | ORAL | Status: DC | PRN

## 2017-04-01 MED ORDER — ONDANSETRON HCL 4 MG PO TABS
4.0000 mg | ORAL_TABLET | ORAL | Status: DC | PRN
  Administered 2017-04-01: 4 mg via ORAL
  Filled 2017-04-01: qty 1

## 2017-04-01 MED ORDER — LORATADINE 10 MG PO TABS: 10.0000 mg | ORAL_TABLET | Freq: Every day | ORAL | Status: DC | PRN

## 2017-04-01 MED ORDER — LACTATED RINGERS IV SOLN: 500.0000 mL | Freq: Once | INTRAVENOUS | Status: DC

## 2017-04-01 MED ORDER — EPHEDRINE 5 MG/ML INJ
10.0000 mg | INTRAVENOUS | Status: DC | PRN
  Filled 2017-04-01: qty 2

## 2017-04-01 MED ORDER — ACETAMINOPHEN 325 MG PO TABS
650.0000 mg | ORAL_TABLET | ORAL | Status: DC | PRN
  Administered 2017-04-01 – 2017-04-02 (×4): 650 mg via ORAL
  Filled 2017-04-01 (×4): qty 2

## 2017-04-01 MED ORDER — PHENYLEPHRINE 40 MCG/ML (10ML) SYRINGE FOR IV PUSH (FOR BLOOD PRESSURE SUPPORT)
80.0000 ug | PREFILLED_SYRINGE | INTRAVENOUS | Status: DC | PRN
  Filled 2017-04-01: qty 5

## 2017-04-01 MED ORDER — FAMOTIDINE 20 MG PO TABS
20.0000 mg | ORAL_TABLET | Freq: Every day | ORAL | Status: DC
  Administered 2017-04-01 – 2017-04-03 (×3): 20 mg via ORAL
  Filled 2017-04-01 (×3): qty 1

## 2017-04-01 MED ORDER — BENZOCAINE-MENTHOL 20-0.5 % EX AERO
1.0000 "application " | INHALATION_SPRAY | CUTANEOUS | Status: DC | PRN
  Administered 2017-04-01 – 2017-04-03 (×2): 1 via TOPICAL
  Filled 2017-04-01 (×2): qty 56

## 2017-04-01 MED ORDER — OXYCODONE-ACETAMINOPHEN 5-325 MG PO TABS: 1.0000 | ORAL_TABLET | ORAL | Status: DC | PRN

## 2017-04-01 MED ORDER — SIMETHICONE 80 MG PO CHEW: 80.0000 mg | CHEWABLE_TABLET | ORAL | Status: DC | PRN

## 2017-04-01 MED ORDER — IBUPROFEN 600 MG PO TABS
600.0000 mg | ORAL_TABLET | Freq: Four times a day (QID) | ORAL | Status: DC
  Administered 2017-04-01 – 2017-04-03 (×10): 600 mg via ORAL
  Filled 2017-04-01 (×10): qty 1

## 2017-04-01 MED ORDER — FLUTICASONE PROPIONATE 50 MCG/ACT NA SUSP
1.0000 | Freq: Every day | NASAL | Status: DC
  Administered 2017-04-01 – 2017-04-03 (×3): 1 via NASAL
  Filled 2017-04-01: qty 16

## 2017-04-01 MED ORDER — LIDOCAINE HCL (PF) 1 % IJ SOLN
INTRAMUSCULAR | Status: DC | PRN
  Administered 2017-04-01 (×2): 4 mL via EPIDURAL

## 2017-04-01 MED ORDER — DIPHENHYDRAMINE HCL 25 MG PO CAPS: 25.0000 mg | ORAL_CAPSULE | Freq: Four times a day (QID) | ORAL | Status: DC | PRN

## 2017-04-01 MED ORDER — DIPHENHYDRAMINE HCL 50 MG/ML IJ SOLN: 12.5000 mg | INTRAMUSCULAR | Status: DC | PRN

## 2017-04-01 MED ORDER — FENTANYL 2.5 MCG/ML BUPIVACAINE 1/10 % EPIDURAL INFUSION (WH - ANES)
14.0000 mL/h | INTRAMUSCULAR | Status: DC | PRN
  Administered 2017-04-01: 14 mL/h via EPIDURAL

## 2017-04-01 MED ORDER — BUTALBITAL-APAP-CAFFEINE 50-325-40 MG PO TABS
1.0000 | ORAL_TABLET | Freq: Four times a day (QID) | ORAL | Status: DC | PRN
  Administered 2017-04-01 – 2017-04-03 (×3): 1 via ORAL
  Filled 2017-04-01 (×3): qty 1

## 2017-04-01 MED ORDER — ONDANSETRON HCL 4 MG/2ML IJ SOLN: 4.0000 mg | INTRAMUSCULAR | Status: DC | PRN

## 2017-04-01 MED ORDER — PHENYLEPHRINE 40 MCG/ML (10ML) SYRINGE FOR IV PUSH (FOR BLOOD PRESSURE SUPPORT)
PREFILLED_SYRINGE | INTRAVENOUS | Status: AC
  Filled 2017-04-01: qty 20

## 2017-04-01 MED ORDER — DIBUCAINE 1 % RE OINT: 1.0000 "application " | TOPICAL_OINTMENT | RECTAL | Status: DC | PRN

## 2017-04-01 MED ORDER — WITCH HAZEL-GLYCERIN EX PADS: 1.0000 "application " | MEDICATED_PAD | CUTANEOUS | Status: DC | PRN

## 2017-04-01 MED ORDER — FENTANYL 2.5 MCG/ML BUPIVACAINE 1/10 % EPIDURAL INFUSION (WH - ANES)
INTRAMUSCULAR | Status: AC
  Filled 2017-04-01: qty 100

## 2017-04-01 MED ORDER — COCONUT OIL OIL
1.0000 "application " | TOPICAL_OIL | Status: DC | PRN
  Administered 2017-04-02: 1 via TOPICAL
  Filled 2017-04-01: qty 120

## 2017-04-01 MED ORDER — PRENATAL MULTIVITAMIN CH
1.0000 | ORAL_TABLET | Freq: Every day | ORAL | Status: DC
  Administered 2017-04-01 – 2017-04-03 (×3): 1 via ORAL
  Filled 2017-04-01 (×3): qty 1

## 2017-04-01 NOTE — Anesthesia Procedure Notes (Signed)
Epidural Patient location during procedure: OB Start time: 04/01/2017 12:37 AM  Staffing Anesthesiologist: Leonides GrillsEllender, Damieon Armendariz P, MD Performed: anesthesiologist   Preanesthetic Checklist Completed: patient identified, site marked, pre-op evaluation, timeout performed, IV checked, risks and benefits discussed and monitors and equipment checked  Epidural Patient position: sitting Prep: DuraPrep Patient monitoring: heart rate, cardiac monitor, continuous pulse ox and blood pressure Approach: midline Location: L4-L5 Injection technique: LOR air  Needle:  Needle type: Tuohy  Needle gauge: 17 G Needle length: 9 cm Needle insertion depth: 7 cm Catheter type: closed end flexible Catheter size: 19 Gauge Catheter at skin depth: 12 cm Test dose: negative and Other  Assessment Events: blood not aspirated, injection not painful, no injection resistance and negative IV test  Additional Notes Informed consent obtained prior to proceeding including risk of failure, 1% risk of PDPH, risk of minor discomfort and bruising. Discussed alternatives to epidural analgesia and patient desires to proceed.  Timeout performed pre-procedure verifying patient name, procedure, and platelet count.  Patient tolerated procedure well. Reason for block:procedure for pain

## 2017-04-01 NOTE — Lactation Note (Signed)
This note was copied from a baby's chart. Lactation Consultation Note  Patient Name: Megan Duran NWGNF'AToday's Date: 04/01/2017 Reason for consult: Follow-up assessment Baby at 15 hr of life. Mom reports baby is "still not latching". She thinks baby is too sleepy to eat. She has been able to use the DEBP and manually express colostrum by spoon. Mom denies breast or nipple pain. She feels like bf is going better now. She is aware of lactation services and will call if feedings do not pick up over the next 12 hr.    Maternal Data    Feeding    LATCH Score                   Interventions    Lactation Tools Discussed/Used     Consult Status Consult Status: Follow-up Date: 04/02/17 Follow-up type: In-patient    [name] 04/01/2017, 8:55 PM

## 2017-04-01 NOTE — Anesthesia Postprocedure Evaluation (Signed)
Anesthesia Post Note  Patient: Megan Duran  Procedure(s) Performed: AN AD HOC LABOR EPIDURAL     Patient location during evaluation: Mother Baby Anesthesia Type: Epidural Level of consciousness: awake Pain management: pain level controlled Vital Signs Assessment: post-procedure vital signs reviewed and stable Respiratory status: spontaneous breathing Cardiovascular status: stable Postop Assessment: no headache, no backache, spinal receding, patient able to bend at knees, no apparent nausea or vomiting and adequate PO intake Anesthetic complications: no    Last Vitals:  Vitals:   04/01/17 0750 04/01/17 0908  BP: 128/80 105/71  Pulse: 100 99  Resp: 18 20  Temp: 36.7 C 36.6 C  SpO2:      Last Pain:  Vitals:   04/01/17 0908  TempSrc: Oral  PainSc: 3    Pain Goal: Patients Stated Pain Goal: 0 (03/31/17 2051)               Fanny DanceMULLINS,Tristina Sahagian

## 2017-04-01 NOTE — Lactation Note (Signed)
This note was copied from a baby's chart. Lactation Consultation Note  Patient Name: Megan Duran Today's Date: 04/01/2017 Reason for consult: Initial assessment;Early term 37-38.6wks;1st time breastfeeding  Initial visit at 10 hours of age.  Mom reports recent feeding with NS for about 10 minutes.  Baby is asleep with mom STS. Mom has DEBP set up at bedside and has used once.   LC assisted mom with hand expression and applied small drops to baby's mouth.  Baby asleep and not waking. LC encouraged mom to pump with DEBP and keep baby STS. Mom aware to spoon feed or syringe feed if baby is not waking for feedings. Mom to call RN for LATCH score. [facility] resources given and discussed.  Encouraged to feed with early cues on demand.  Early newborn behavior discussed.  Hand expression demonstrated with colostrum visible.  Mom to call for assist as needed.     Maternal Data Has patient been taught Hand Expression?: Yes Does the patient have breastfeeding experience prior to this delivery?: No  Feeding Feeding Type: Breast Fed Length of feed: (on and off for 15 min)  LATCH Score Latch: Too sleepy or reluctant, no latch achieved, no sucking elicited.  Audible Swallowing: None  Type of Nipple: Flat  Comfort (Breast/Nipple): Soft / non-tender  Hold (Positioning): Assistance needed to correctly position infant at breast and maintain latch.  LATCH Score: 4  Interventions Interventions: Breast feeding basics reviewed;Skin to skin;Hand express  Lactation Tools Discussed/Used Tools: Nipple Shields Nipple shield size: 20 Initiated by:: induction of lactatin with NS and poor feeding   Consult Status Consult Status: Follow-up Date: 04/02/17 Follow-up type: In-patient    Franz DellJana Shoptaw 04/01/2017, 3:32 PM

## 2017-04-01 NOTE — Anesthesia Preprocedure Evaluation (Signed)
Anesthesia Evaluation  Patient identified by MRN, date of birth, ID band Patient awake    Reviewed: Allergy & Precautions, H&P , NPO status , Patient's Chart, lab work & pertinent test results  History of Anesthesia Complications Negative for: history of anesthetic complications  Airway Mallampati: II  TM Distance: >3 FB Neck ROM: full    Dental no notable dental hx. (+) Teeth Intact   Pulmonary asthma ,    Pulmonary exam normal breath sounds clear to auscultation       Cardiovascular negative cardio ROS Normal cardiovascular exam Rhythm:regular Rate:Normal     Neuro/Psych  Headaches, negative psych ROS   GI/Hepatic Neg liver ROS, GERD  ,IBS (irritable bowel syndrome)   Endo/Other  Morbid obesity  Renal/GU negative Renal ROS  negative genitourinary   Musculoskeletal   Abdominal (+) + obese,   Peds  Hematology negative hematology ROS (+)   Anesthesia Other Findings   Reproductive/Obstetrics (+) Pregnancy                             Anesthesia Physical Anesthesia Plan  ASA: III  Anesthesia Plan: Epidural   Post-op Pain Management:    Induction:   PONV Risk Score and Plan:   Airway Management Planned:   Additional Equipment:   Intra-op Plan:   Post-operative Plan:   Informed Consent: I have reviewed the patients History and Physical, chart, labs and discussed the procedure including the risks, benefits and alternatives for the proposed anesthesia with the patient or authorized representative who has indicated his/her understanding and acceptance.     Plan Discussed with:   Anesthesia Plan Comments:         Anesthesia Quick Evaluation

## 2017-04-01 NOTE — Progress Notes (Signed)
Post Partum Day 0 Subjective: no complaints and "sore, cramping"  Objective: Blood pressure 105/71, pulse 99, temperature 97.8 F (36.6 C), temperature source Oral, resp. rate 20, height 5\' 5"  (1.651 m), weight 116.6 kg (257 lb), last menstrual period 07/14/2016, SpO2 99 %, unknown if currently breastfeeding.  Physical Exam:  General: alert, cooperative and no distress Lochia: appropriate Uterine Fundus: firm DVT Evaluation: No evidence of DVT seen on physical exam.  Recent Labs    03/31/17 2138  HGB 11.0*  HCT 34.5*    Assessment/Plan: Continue routine post partum care   LOS: 1 day   Lyndol Vanderheiden STACIA 04/01/2017, 9:50 AM

## 2017-04-01 NOTE — H&P (Signed)
36 y.o. 613w2d  G2P1001 comes in c/o gush of fluid.  Was in MAU earlier today with contractions, was ruled out for rupture at that time and cervix remained unchanged so was discharged home.   Now fern+ with less frequent contractions. Otherwise has good fetal movement and no bleeding.  Past Medical History:  Diagnosis Date  . Asthma    only uses maintanence inhaler  . GERD (gastroesophageal reflux disease)    takes meds  . History of MRSA infection   . IBS (irritable bowel syndrome)   . Migraine     Past Surgical History:  Procedure Laterality Date  . GALLBLADDER SURGERY  2007    OB History  Gravida Para Term Preterm AB Living  2 1 1     1   SAB TAB Ectopic Multiple Live Births               # Outcome Date GA Lbr Len/2nd Weight Sex Delivery Anes PTL Lv  2 Current           1 Term 05/04/09    F Vag-Spont         Social History   Socioeconomic History  . Marital status: Married    Spouse name: Not on file  . Number of children: Not on file  . Years of education: Not on file  . Highest education level: Not on file  Social Needs  . Financial resource strain: Not on file  . Food insecurity - worry: Not on file  . Food insecurity - inability: Not on file  . Transportation needs - medical: Not on file  . Transportation needs - non-medical: Not on file  Occupational History  . Not on file  Tobacco Use  . Smoking status: Never Smoker  . Smokeless tobacco: Never Used  Substance and Sexual Activity  . Alcohol use: No    Alcohol/week: 0.0 oz    Frequency: Never  . Drug use: No  . Sexual activity: Yes  Other Topics Concern  . Not on file  Social History Narrative  . Not on file   Bactrim [sulfamethoxazole-trimethoprim]; Biaxin [clarithromycin]; Doxycycline; Effexor [venlafaxine]; Erythromycin; Imitrex [sumatriptan]; Keflex [cephalexin]; and Minocycline    Prenatal Transfer Tool  Maternal Diabetes: No Genetic Screening: Normal Maternal Ultrasounds/Referrals:  Normal Fetal Ultrasounds or other Referrals:  None Maternal Substance Abuse:  No Significant Maternal Medications:  None Significant Maternal Lab Results: Lab values include: Group B Strep negative  Other PNC: uncomplicated.    Vitals:   04/01/17 0529 04/01/17 0530  BP: 132/66 121/62  Pulse: (!) 117 (!) 115  Resp: 16 16  Temp:    SpO2:       Lungs/Cor:  NAD Abdomen:  soft, gravid Ex:  no cords, erythema SVE:  3.5/70/-2 at admission FHTs:  140, good STV, NST R Toco:  occ   A/P   Admitted with SROM  Contractions currently infrequent, pitocin 2x2 start  GBS Neg  Epidural as desired  Other routine care [place], [facility]

## 2017-04-02 LAB — CBC
HCT: 31.8 % — ABNORMAL LOW (ref 36.0–46.0)
Hemoglobin: 10.2 g/dL — ABNORMAL LOW (ref 12.0–15.0)
MCH: 29.6 pg (ref 26.0–34.0)
MCHC: 32.1 g/dL (ref 30.0–36.0)
MCV: 92.2 fL (ref 78.0–100.0)
Platelets: 189 10*3/uL (ref 150–400)
RBC: 3.45 MIL/uL — ABNORMAL LOW (ref 3.87–5.11)
RDW: 15 % (ref 11.5–15.5)
WBC: 10.4 10*3/uL (ref 4.0–10.5)

## 2017-04-02 NOTE — Lactation Note (Addendum)
This note was copied from a baby's chart. Lactation Consultation Note Baby 25 hrs old. Not BF hardly. Baby sleepy. Attempted to latch multiple times, mom concern baby needs to eat. Mom has used DEBP w/small amount of colostrum. Collected 1 ml. Taught spoon feeding. Gave 0.765ml colostrum. Baby so sleepy, not safe to feed. Needed much stimulation to swallow.  Mom has pendulous breast, Rt. flat nipple at the bottom end of breast.  Multiple times tried to stimulate baby to suck on gloved finger,. Had no interest at all! Placed STS to mom and attempted to latch w/NS, baby wouldn't suck.  Mom's nipple are red, slightly sore from first day. Mom states when the baby has latched, he didn't maintain the latch and wasn't productive.  Discussed supplementing w/colostrum first, then may have to use formula. Asked mom to discuss this w/MD. Explained to parents the baby is just 25 hrs. Old and they are often sleepy, baby has BF some, has had output, has been spitty. As new parents wants longer BF from baby. Discussed cluster feeding will come. Encouraged mom to hold baby STS and hand express. Patient Name: Megan Zerita BoersJennifer Kamp UJWJX'BToday's Date: 04/02/2017 Reason for consult: Follow-up assessment;Difficult latch;Early term 37-38.6wks   Maternal Data    Feeding Feeding Type: Breast Milk Length of feed: 0 min  LATCH Score Latch: Too sleepy or reluctant, no latch achieved, no sucking elicited.  Audible Swallowing: None  Type of Nipple: Flat  Comfort (Breast/Nipple): Filling, red/small blisters or bruises, mild/mod discomfort  Hold (Positioning): Full assist, staff holds infant at breast  LATCH Score: 2  Interventions Interventions: Breast feeding basics reviewed;Coconut oil;Assisted with latch;Breast compression;Shells;Skin to skin;Adjust position;Comfort gels;Breast massage;Support pillows;Hand pump;Hand express;Position options;DEBP;Pre-pump if needed;Expressed milk  Lactation Tools  Discussed/Used Tools: Shells;Pump;Coconut oil;Comfort gels;Nipple Shields Nipple shield size: 20 Shell Type: Inverted Breast pump type: Double-Electric Breast Pump WIC Program: No Pump Review: Setup, frequency, and cleaning;Milk Storage Initiated by:: RN Date initiated:: 04/01/17   Consult Status Consult Status: Follow-up Date: 04/02/17 Follow-up type: In-patient    Charyl DancerCARVER, Carols Clemence G 04/02/2017, 7:15 AM

## 2017-04-02 NOTE — Progress Notes (Signed)
Patient is doing well.  She is ambulating, voiding, tolerating PO.  Pain control is good.  Lochia is appropriate.  Difficulty with breastfeeding--baby not latching and not interested per pt report  Vitals:   04/01/17 0750 04/01/17 0908 04/01/17 1328 04/01/17 2130  BP: 128/80 105/71 118/70 118/64  Pulse: 100 99 97 95  Resp: 18 20 18 16   Temp: 98 F (36.7 C) 97.8 F (36.6 C) 97.6 F (36.4 C) 97.9 F (36.6 C)  TempSrc: Oral Oral Oral Oral  SpO2:    97%  Weight:      Height:        NAD Fundus firm Ext: 1+ edema b/l  Lab Results  Component Value Date   WBC 10.4 04/02/2017   HGB 10.2 (L) 04/02/2017   HCT 31.8 (L) 04/02/2017   MCV 92.2 04/02/2017   PLT 189 04/02/2017    --/--/A POS, A POS (11/19 2138)/RImmune  A/P 36 y.o. G2P2002 PPD#1. Routine care.   Desires circ, but patient reports pediatrician has requested that we defer at this time due to poor feeding.    [facility] LLCDYANNA GEFFEL [employer] CompanyCLARK

## 2017-04-02 NOTE — Lactation Note (Signed)
This note was copied from a baby's chart. Lactation Consultation Note  Patient Name: Megan Duran MVHQI'OToday's Date: 04/02/2017 Reason for consult: Follow-up assessment;Early term 4437-38.6wks  Baby 4829 hours old. MD request to see patient and set up an SNS. Mom had less than 1 ml of EBM at bedside, so assisted mom with hand expression and collected a total of 1.5 ml which parents gave using curve-tipped syringe and FOB's finger. Enc mom to continue to put the baby to breast with cues, and refitted mom with #24 NS. Enc parents to supplement baby after BF according to supplementation guidelines--which were given with review. Mom wanting to continue pumping and hand expressing to see if EBM volumes increase to 7 ml in the next few hours. Mom states that she intends to ask for formula if her volumes are not increasing with the next few feedings. Enc mom to continue to post-pump followed by hand expression, and discussed progression of milk coming to volume.   Mom has 5 [place] feeding system, with review and single SNS in room for larger volumes of supplementation. Discussed assessment, interventions and feeding plan with Morrie SheldonAshley, RN.   Maternal Data    Feeding Feeding Type: Breast Milk Length of feed: 5 min  LATCH Score Latch: Too sleepy or reluctant, no latch achieved, no sucking elicited.  Audible Swallowing: None  Type of Nipple: Flat  Comfort (Breast/Nipple): Filling, red/small blisters or bruises, mild/mod discomfort  Hold (Positioning): Full assist, staff holds infant at breast  LATCH Score: 2  Interventions Interventions: Breast feeding basics reviewed;Hand express;Pre-pump if needed;Expressed milk  Lactation Tools Discussed/Used Tools: Shells;Pump;Coconut oil;Nipple Shields;64F feeding tube / Syringe Nipple shield size: 24 Shell Type: Inverted Breast pump type: Double-Electric Breast Pump WIC Program: No Pump Review: Setup, frequency, and cleaning;Milk Storage Initiated  by:: RN Date initiated:: 04/01/17   Consult Status Consult Status: PRN Date: 04/02/17 Follow-up type: In-patient    Megan Duran 04/02/2017, 10:46 AM

## 2017-04-03 NOTE — Lactation Note (Addendum)
This note was copied from a baby's chart. Lactation Consultation Note Baby doing much better w/feedings. Parents are inserting formula into NS w/curve tip syring. Baby BF in football position STS. Baby looks increased jaundice since yesterday. 04/02/17. Praised parents, cont. What they are doing.  Noted mom's breast fuller. Discussed engorgement, prevention, and maintaining milk supply.  Encouraged parents to call for assistance if needed. Baby will be circumcised today. Prepared parents for sleepiness. Encouraged STS today while sleepy. While feeding, baby spit up curdled formula.  Parents would like to go home today. Will need f/u w/OP LC.                                                                                                                                                                                                                                                           Patient Name: Boy Zerita BoersJennifer Broce WUJWJ'XToday's Date: 04/03/2017 Reason for consult: Follow-up assessment   Maternal Data    Feeding Feeding Type: Formula  LATCH Score Latch: Grasps breast easily, tongue down, lips flanged, rhythmical sucking.  Audible Swallowing: Spontaneous and intermittent  Type of Nipple: Flat  Comfort (Breast/Nipple): Filling, red/small blisters or bruises, mild/mod discomfort  Hold (Positioning): Assistance needed to correctly position infant at breast and maintain latch.  LATCH Score: 7  Interventions Interventions: Assisted with latch;Breast compression;Skin to skin;Comfort gels;Breast massage;Support pillows;Hand pump;Hand express;Position options  Lactation Tools Discussed/Used Tools: Nipple Dorris CarnesShields;Pump Nipple shield size: 24 Shell Type: Inverted   Consult Status Consult Status: Follow-up Date: 04/03/17 Follow-up type: In-patient    Charyl DancerCARVER, Rhayne Chatwin G 04/03/2017, 6:10 AM

## 2017-04-03 NOTE — Lactation Note (Signed)
This note was copied from a baby's chart. Lactation Consultation Note  Patient Name: Boy Zerita BoersJennifer Rancourt Today's Date: 04/03/2017   Mom states baby is feeding well using the 24 mm nipple shield.  She is post pumping and obtaining 3-7 mls of colostrum.  Mom has a DEBP for home use.  Instructed to continue post pumping until milk supply is well established.  Lactation outpatient services and support information reviewed and encouraged prn.  Maternal Data    Feeding    LATCH Score                   Interventions    Lactation Tools Discussed/Used     Consult Status      Huston FoleyMOULDEN, Jemimah Cressy S 04/03/2017, 10:44 AM

## 2017-04-03 NOTE — Progress Notes (Signed)
PPD#2 Pt without complaints. Doing well VSSAF IMP/ Stable Plan/Will discharge

## 2017-04-03 NOTE — Discharge Summary (Signed)
Obstetric Discharge Summary Reason for Admission: rupture of membranes Prenatal Procedures: ultrasound Intrapartum Procedures: spontaneous vaginal delivery Postpartum Procedures: none Complications-Operative and Postpartum: 2nd degree perineal laceration Hemoglobin  Date Value Ref Range Status  04/02/2017 10.2 (L) 12.0 - 15.0 g/dL Final   HCT  Date Value Ref Range Status  04/02/2017 31.8 (L) 36.0 - 46.0 % Final    Physical Exam:  General: alert and cooperative Lochia: appropriate Uterine Fundus: firm  Discharge Diagnoses: Term Pregnancy-delivered  Discharge Information: Date: 04/03/2017 Activity: pelvic rest Diet: routine Medications: PNV and Ibuprofen Condition: stable Instructions: refer to practice specific booklet Discharge to: home Follow-up Information    Philip AspenCallahan, Sidney, DO. Schedule an appointment as soon as possible for a visit in 1 month(s).   Specialty:  Obstetrics and Gynecology Contact information: [address] [place] [address] [phone]           Newborn Data: Live born female  Birth Weight: 6 lb 15.5 oz (3161 g) APGAR: 8, 9  Newborn Delivery   Birth date/time:  04/01/2017 05:13:00 Delivery type:  Vaginal, Spontaneous     Home with mother.  Megan Duran E 04/03/2017, 10:58 AM

## 2017-04-18 DIAGNOSIS — G4733 Obstructive sleep apnea (adult) (pediatric): Secondary | ICD-10-CM | POA: Diagnosis not present

## 2017-04-18 DIAGNOSIS — Z3482 Encounter for supervision of other normal pregnancy, second trimester: Secondary | ICD-10-CM | POA: Diagnosis not present

## 2017-04-18 DIAGNOSIS — Z3483 Encounter for supervision of other normal pregnancy, third trimester: Secondary | ICD-10-CM | POA: Diagnosis not present

## 2017-05-20 DIAGNOSIS — Z3202 Encounter for pregnancy test, result negative: Secondary | ICD-10-CM | POA: Diagnosis not present

## 2017-05-20 DIAGNOSIS — Z30430 Encounter for insertion of intrauterine contraceptive device: Secondary | ICD-10-CM | POA: Diagnosis not present

## 2017-06-17 DIAGNOSIS — Z30431 Encounter for routine checking of intrauterine contraceptive device: Secondary | ICD-10-CM | POA: Diagnosis not present

## 2017-07-25 DIAGNOSIS — J0100 Acute maxillary sinusitis, unspecified: Secondary | ICD-10-CM | POA: Diagnosis not present

## 2017-08-26 DIAGNOSIS — J0100 Acute maxillary sinusitis, unspecified: Secondary | ICD-10-CM | POA: Diagnosis not present

## 2017-08-26 DIAGNOSIS — J209 Acute bronchitis, unspecified: Secondary | ICD-10-CM | POA: Diagnosis not present

## 2017-09-03 DIAGNOSIS — J329 Chronic sinusitis, unspecified: Secondary | ICD-10-CM | POA: Diagnosis not present

## 2017-09-03 DIAGNOSIS — R062 Wheezing: Secondary | ICD-10-CM | POA: Diagnosis not present

## 2017-09-03 DIAGNOSIS — J4521 Mild intermittent asthma with (acute) exacerbation: Secondary | ICD-10-CM | POA: Diagnosis not present

## 2017-09-03 DIAGNOSIS — M940 Chondrocostal junction syndrome [Tietze]: Secondary | ICD-10-CM | POA: Diagnosis not present

## 2018-02-18 DIAGNOSIS — J0100 Acute maxillary sinusitis, unspecified: Secondary | ICD-10-CM | POA: Diagnosis not present

## 2018-04-14 ENCOUNTER — Telehealth: Payer: 59 | Admitting: Physician Assistant

## 2018-04-14 DIAGNOSIS — J019 Acute sinusitis, unspecified: Secondary | ICD-10-CM | POA: Diagnosis not present

## 2018-04-14 DIAGNOSIS — B9689 Other specified bacterial agents as the cause of diseases classified elsewhere: Secondary | ICD-10-CM

## 2018-04-14 MED ORDER — AMOXICILLIN-POT CLAVULANATE 875-125 MG PO TABS: 1.0000 | ORAL_TABLET | Freq: Two times a day (BID) | ORAL | 0 refills | Status: AC

## 2018-04-14 NOTE — Progress Notes (Signed)

## 2018-04-22 DIAGNOSIS — J205 Acute bronchitis due to respiratory syncytial virus: Secondary | ICD-10-CM | POA: Diagnosis not present

## 2018-04-22 DIAGNOSIS — J0100 Acute maxillary sinusitis, unspecified: Secondary | ICD-10-CM | POA: Diagnosis not present

## 2018-06-15 ENCOUNTER — Encounter: Payer: Self-pay | Admitting: Physician Assistant

## 2018-06-15 ENCOUNTER — Telehealth: Payer: 59 | Admitting: Physician Assistant

## 2018-06-15 DIAGNOSIS — J329 Chronic sinusitis, unspecified: Secondary | ICD-10-CM

## 2018-06-15 NOTE — Progress Notes (Addendum)
Based on what you shared with me it looks like you have a  condition that should be evaluated in a face to face office visit.   You symptoms are typical of another sinus infection. However, on review of your records, you have had an Evisit about 2-3 months ago with a sinus infection. Recurrence of the sinus infection within this time frame warrant a face to face evaluation. This guarantees that you are best taken care of and that we are not missing any other causes.    NOTE: If you entered your credit card information for this eVisit, you will not be charged. You may see a "hold" on your card for the $30 but that hold will drop off and you will not have a charge processed.  If you are having a true medical emergency please call 911.  If you need an urgent face to face visit, [place] has four urgent care centers for your convenience.  If you need care fast and have a high deductible or no insurance consider:   [email or web address] to reserve your spot online an avoid wait times  [facility] [address], [address] [place], [place] [address] 8 am to 8 pm Monday-Friday 10 am to 4 pm Saturday-Sunday *Across the street from [employer]  [address] [place], [address] 8 am to 5 pm Monday-Friday * In the [facility] on the [facility]   The following sites will take your  insurance:  . [facility]  [phone] Get Driving Directions Find a Provider at this Location  [address] [place], [place] [address] . 10 am to 8 pm Monday-Friday . 12 pm to 8 pm Saturday-Sunday   . [facility]  [phone] Get Driving Directions Find a Provider at this Location  [address] [place], [place] [address] . 8 am to 8 pm Monday-Friday . 9 am to 6 pm Saturday . 11 am to 6 pm Sunday   . [facility] at [facility]  [phone] Get  Driving Directions  [address] [place], [place] [address] . 8 am to 8 pm Monday-Friday . 8 am to 4 pm Saturday-Sunday   Your e-visit answers were reviewed by a board certified advanced clinical practitioner to complete your personal care plan.  Thank you for using e-Visits.  I took 10 minutes to complete this note- SO

## 2018-08-26 ENCOUNTER — Other Ambulatory Visit: Payer: Self-pay | Admitting: Allergy and Immunology

## 2018-08-26 ENCOUNTER — Ambulatory Visit (INDEPENDENT_AMBULATORY_CARE_PROVIDER_SITE_OTHER): Payer: 59 | Admitting: Allergy and Immunology

## 2018-08-26 ENCOUNTER — Encounter: Payer: Self-pay | Admitting: Allergy and Immunology

## 2018-08-26 ENCOUNTER — Other Ambulatory Visit: Payer: Self-pay

## 2018-08-26 VITALS — BP 160/104 | HR 110 | Temp 98.6°F | Resp 18

## 2018-08-26 DIAGNOSIS — J3089 Other allergic rhinitis: Secondary | ICD-10-CM | POA: Diagnosis not present

## 2018-08-26 DIAGNOSIS — K219 Gastro-esophageal reflux disease without esophagitis: Secondary | ICD-10-CM

## 2018-08-26 DIAGNOSIS — J4540 Moderate persistent asthma, uncomplicated: Secondary | ICD-10-CM | POA: Diagnosis not present

## 2018-08-26 DIAGNOSIS — R030 Elevated blood-pressure reading, without diagnosis of hypertension: Secondary | ICD-10-CM

## 2018-08-26 MED ORDER — LEVALBUTEROL TARTRATE 45 MCG/ACT IN AERO: INHALATION_SPRAY | RESPIRATORY_TRACT | 0 refills | Status: DC

## 2018-08-26 MED ORDER — OMEPRAZOLE 20 MG PO CPDR: 20.0000 mg | DELAYED_RELEASE_CAPSULE | Freq: Every day | ORAL | 1 refills | Status: DC

## 2018-08-26 MED ORDER — CETIRIZINE HCL 10 MG PO TABS: ORAL_TABLET | ORAL | 1 refills | Status: DC

## 2018-08-26 MED ORDER — FLUTICASONE PROPIONATE 50 MCG/ACT NA SUSP: NASAL | 1 refills | Status: DC

## 2018-08-26 MED ORDER — SYMBICORT 160-4.5 MCG/ACT IN AERO: INHALATION_SPRAY | RESPIRATORY_TRACT | 1 refills | Status: DC

## 2018-08-26 MED FILL — LEVALBUTEROL TAR HFA 45MCG: 45 | 17 days supply | Qty: 15 | Fill #0

## 2018-08-26 MED FILL — FLUTICASONE PROP 50 MCG SPR: 50 | 30 days supply | Qty: 16 | Fill #0

## 2018-08-26 MED FILL — SYMBICORT 160-4.5 MCG INH: 160-4.5 | 30 days supply | Qty: 10 | Fill #0

## 2018-08-26 MED FILL — OMEPRAZOLE 20 MG CPDR: 20 | 90 days supply | Qty: 90 | Fill #0

## 2018-08-26 NOTE — Progress Notes (Signed)
[place] [facility] [place] [facility] [place] [facility] [place] [facility] [place]   Follow-up Note  Referring Provider: No ref. provider found Primary Provider: Krystal Clark, NP Date of Office Visit: 08/26/2018  Subjective:   Megan Duran (DOB: June 20, 1980) is a 38 y.o. female who [facility]2018 in re-evaluation of the following:  HPI: Corine returns to this clinic in reevaluation of asthma and allergic rhinitis and reflux induced respiratory disease.  I have not seen her in this clinic since 10 August 2015.  While utilizing a plan of anti-inflammatory therapy including the use of Symbicort and a nasal steroid and therapy directed against reflux she has really done well with her airway.  She does not think that she has received a systemic steroid or antibiotic in years for a respiratory tract issue.  Her requirement for short acting bronchodilator is minimal averaging out to less than a few times a month.  Currently she is using her Symbicort mostly 1 time per day but occasionally twice a day and she uses her Flonase just about every day.  Her reflux has been under excellent control currently using a proton pump inhibitor.  He did receive the flu vaccine this year.  Allergies as of 08/26/2018      Reactions   Bactrim [sulfamethoxazole-trimethoprim] Hives   Biaxin [clarithromycin] Hives   Doxycycline Hives   Effexor [venlafaxine] Hives   Erythromycin Hives   Imitrex [sumatriptan] Hives   Keflex [cephalexin] Hives   Minocycline Hives      Medication List      budesonide-formoterol 160-4.5 MCG/ACT inhaler Commonly known as:  SYMBICORT Inhale 2 puffs into the lungs 2 (two) times daily as needed (Shortness of breath, cold, congestion).   cetirizine 10 MG tablet Commonly known as:  ZYRTEC Take 10 mg by mouth daily.   Flonase 50 MCG/ACT nasal spray Generic drug:  fluticasone Place 1 spray into both nostrils daily.   levalbuterol 45  MCG/ACT inhaler Commonly known as:  Xopenex HFA Inhale 2 puffs into the lungs every 6 (six) hours as needed for wheezing.   omeprazole 20 MG capsule Commonly known as:  PRILOSEC Take 20 mg by mouth daily.   VITAMIN C PO Take by mouth.   VITAMIN D PO Take by mouth daily.       Past Medical History:  Diagnosis Date  . Asthma    only uses maintanence inhaler  . GERD (gastroesophageal reflux disease)    takes meds  . History of MRSA infection   . IBS (irritable bowel syndrome)   . Migraine     Past Surgical History:  Procedure Laterality Date  . GALLBLADDER SURGERY  2007    Review of systems negative except as noted in HPI / PMHx or noted below:  Review of Systems  Constitutional: Negative.   HENT: Negative.   Eyes: Negative.   Respiratory: Negative.   Cardiovascular: Negative.   Gastrointestinal: Negative.   Genitourinary: Negative.   Musculoskeletal: Negative.   Skin: Negative.   Neurological: Negative.   Endo/Heme/Allergies: Negative.   Psychiatric/Behavioral: Negative.      Objective:   Vitals:   08/26/18 1354  BP: (!) 160/104  Pulse: (!) 110  Resp: 18  Temp: 98.6 F (37 C)  SpO2: 98%          Physical Exam Constitutional:      Appearance: She is not diaphoretic.  HENT:     Head: Normocephalic.     Right Ear: Tympanic  membrane, ear canal and external ear normal.     Left Ear: Tympanic membrane, ear canal and external ear normal.     Nose: Nose normal. No mucosal edema or rhinorrhea.     Mouth/Throat:     Pharynx: Uvula midline. No oropharyngeal exudate.  Eyes:     Conjunctiva/sclera: Conjunctivae normal.  Neck:     Thyroid: No thyromegaly.     Trachea: Trachea normal. No tracheal tenderness or tracheal deviation.  Cardiovascular:     Rate and Rhythm: Normal rate and regular rhythm.     Heart sounds: Normal heart sounds, S1 normal and S2 normal. No murmur.  Pulmonary:     Effort: No respiratory distress.     Breath sounds: Normal  breath sounds. No stridor. No wheezing or rales.  Lymphadenopathy:     Head:     Right side of head: No tonsillar adenopathy.     Left side of head: No tonsillar adenopathy.     Cervical: No cervical adenopathy.  Skin:    Findings: No erythema or rash.     Nails: There is no clubbing.   Neurological:     Mental Status: She is alert.     Diagnostics:    Spirometry was performed and demonstrated an FEV1 of 2.56 at 79 % of predicted.  The patient had an Asthma Control Test with the following results:  .    Assessment and Plan:   1. Asthma, moderate persistent, well-controlled   2. Perennial allergic rhinitis   3. LPRD (laryngopharyngeal reflux disease)   4. Elevated blood pressure reading     1.  Continue Symbicort 160 -2 inhalations 1-2 times per day depending on disease activity  2.  Continue Flonase -1-2 sprays each nostril daily  3.  Continue omeprazole 20 mg daily  4.  If needed:   A.  Xopenex HFA 2 inhalations every 4-6 hours  B.  Zyrtec 10 mg daily  5.  Return to clinic in 1 year or earlier if problem  6.  Determine if elevated blood pressure is a trend.  Victorino DikeJennifer appears to be doing very well on her current medical therapy which includes anti-inflammatory agents for her airway and therapy directed against reflux.  She has a very good understanding about her disease process and the appropriate use of her medications.  She will continue on the plan noted above and I will see her back in this clinic in 1 year or earlier if there is a problem.  We did inform her about her elevated blood pressure and she needs to determine if this is a trend or isolated reading.  Laurette SchimkeEric Lasondra Hodgkins, MD Allergy / Immunology [facility]

## 2018-08-26 NOTE — Patient Instructions (Addendum)
  1.  Continue Symbicort 160 -2 inhalations 1-2 times per day depending on disease activity  2.  Continue Flonase -1-2 sprays each nostril daily  3.  Continue omeprazole 20 mg daily  4.  If needed:   A.  Xopenex HFA 2 inhalations every 4-6 hours  B.  Zyrtec 10 mg daily  5.  Return to clinic in 1 year or earlier if problem  6.  Determine if elevated blood pressure is a trend.

## 2018-08-27 ENCOUNTER — Encounter: Payer: Self-pay | Admitting: Allergy and Immunology

## 2018-10-31 DIAGNOSIS — Z20828 Contact with and (suspected) exposure to other viral communicable diseases: Secondary | ICD-10-CM | POA: Diagnosis not present

## 2018-10-31 DIAGNOSIS — R05 Cough: Secondary | ICD-10-CM | POA: Diagnosis not present

## 2018-10-31 DIAGNOSIS — J069 Acute upper respiratory infection, unspecified: Secondary | ICD-10-CM | POA: Diagnosis not present

## 2018-11-09 DIAGNOSIS — H6503 Acute serous otitis media, bilateral: Secondary | ICD-10-CM | POA: Insufficient documentation

## 2018-11-12 ENCOUNTER — Ambulatory Visit (INDEPENDENT_AMBULATORY_CARE_PROVIDER_SITE_OTHER): Payer: 59 | Admitting: Allergy and Immunology

## 2018-11-12 ENCOUNTER — Other Ambulatory Visit: Payer: Self-pay

## 2018-11-12 ENCOUNTER — Encounter: Payer: Self-pay | Admitting: Allergy and Immunology

## 2018-11-12 VITALS — BP 142/98 | HR 90 | Temp 98.4°F | Resp 20

## 2018-11-12 DIAGNOSIS — K219 Gastro-esophageal reflux disease without esophagitis: Secondary | ICD-10-CM

## 2018-11-12 DIAGNOSIS — J4541 Moderate persistent asthma with (acute) exacerbation: Secondary | ICD-10-CM

## 2018-11-12 DIAGNOSIS — J3089 Other allergic rhinitis: Secondary | ICD-10-CM

## 2018-11-12 MED ORDER — MECLIZINE HCL 12.5 MG PO TABS: ORAL_TABLET | ORAL | 1 refills | Status: DC

## 2018-11-12 NOTE — Patient Instructions (Addendum)
  1.  Continue Symbicort 160 -2 inhalations 1-2 times per day depending on disease activity  2.  Continue Flonase -1-2 sprays each nostril daily  3.  Continue omeprazole 20 mg daily  4.  If needed:   A.  Xopenex HFA 2 inhalations every 4-6 hours  B.  Zyrtec 10 mg daily  C. Nasal saline  5. For this episode:   A. OTC Ibuprofen  B. Add sample QVAR 80 - 2 inhalations 2 times a day to Symbicort  C. Meclizine 12.5 mg every 6 hours if needed for vertigo  D. Use 'horizon' viewing to help with vertigo  E. Cut back on aggressive nasal saline washes  6.  Return to clinic as previously arranged or earlier if problem  7. Remain away from decongestant use and check blood pressure and heart rate.

## 2018-11-12 NOTE — Progress Notes (Signed)
[place]   Follow-up Note  Referring Provider: Rhea BleacherBrown-Patram, Melissa J* Primary Provider: Krystal ClarkBrown-Patram, Melissa Joyce, NP Date of Office Visit: 11/12/2018  Subjective:   Megan Duran (DOB: 02/10/1981) is a 38 y.o. female who [facility] in re-evaluation of the following:  HPI: Megan Duran returns to this clinic in reevaluation of asthma and allergic rhinitis and reflux induced respiratory disease.  Her last visit to this clinic was 26 August 2018.  At that point in time she was doing quite well regarding all of her issues while using a collection of therapy directed against respiratory tract inflammation and reflux.  Apparently on 24 October 2018 she had acute headache and ear pain and vertigo and felt bad in general with a low-grade fever up to 99.4.  Since that point in time she has been evaluated in urgent care center and treated with Levaquin and then evaluated by another center and given prednisone.  She just finished her prednisone today and she finished her Levaquin last week.  She is better but she still has a little low-grade headache and she still has some intermittent vertigo and she has been getting some shortness of breath for the past week.  Apparently she has been tested 3 times for COVID-19 which have been negative.  She does not have any significant anosmia or decreased ability to taste or ugly nasal discharge or chest pain or sputum production but she may have a little bit of nasal congestion.  She has been using lots of nasal saline at least 4 times per day and she has been using her bronchodilator about 4 times per day.  There is not really any associated systemic or constitutional symptoms other than being somewhat fatigued.  She has been out of work for the past 2 weekends.  Allergies as of 11/12/2018      Reactions   Bactrim [sulfamethoxazole-trimethoprim] Hives   Biaxin  [clarithromycin] Hives   Doxycycline Hives   Effexor [venlafaxine] Hives   Erythromycin Hives   Imitrex [sumatriptan] Hives   Keflex [cephalexin] Hives   Minocycline Hives      Medication List    cetirizine 10 MG tablet Commonly known as: ZYRTEC Can take one tablet by mouth once daily if needed.   fluticasone 50 MCG/ACT nasal spray Commonly known as: Flonase Use one to two sprays in each nostril once daily as directed.   levalbuterol 45 MCG/ACT inhaler Commonly known as: Xopenex HFA Can use two puffs every four to six hours as needed for cough or wheeze.   meclizine 12.5 MG tablet Commonly known as: ANTIVERT Take one tablet every 6 hours if needed for vertigo. Started by: Jessica PriestERIC J Sydna Brodowski, MD   MULTIVITAMIN PO Take by mouth.   omeprazole 20 MG capsule Commonly known as: PRILOSEC Take 1 capsule (20 mg total) by mouth daily.   Symbicort 160-4.5 MCG/ACT inhaler Generic drug: budesonide-formoterol Inhale two puffs twice daily to prevent cough or wheeze.  Rinse, gargle, and spit after use.   VITAMIN D PO Take by mouth daily.       Past Medical History:  Diagnosis Date  . Asthma    only uses maintanence inhaler  . GERD (gastroesophageal reflux disease)    takes meds  . History of MRSA infection   . IBS (irritable bowel syndrome)   . Migraine     Past Surgical History:  Procedure Laterality Date  . GALLBLADDER SURGERY  2007    Review of systems negative except as noted in HPI / PMHx or noted below:  Review of Systems  Constitutional: Negative.   HENT: Negative.   Eyes: Negative.   Respiratory: Negative.   Cardiovascular: Negative.   Gastrointestinal: Negative.   Genitourinary: Negative.   Musculoskeletal: Negative.   Skin: Negative.   Neurological: Negative.   Endo/Heme/Allergies: Negative.   Psychiatric/Behavioral: Negative.      Objective:   Vitals:   11/12/18 0954  BP: (!) 142/98  Pulse: 90  Resp: 20  Temp: 98.4 F (36.9 C)           Physical Exam Constitutional:      Appearance: She is not diaphoretic.  HENT:     Head: Normocephalic.     Right Ear: Ear canal and external ear normal. Tympanic membrane is scarred.     Left Ear: Tympanic membrane, ear canal and external ear normal.     Nose: Nose normal. No mucosal edema or rhinorrhea.     Mouth/Throat:     Pharynx: Uvula midline. No oropharyngeal exudate.  Eyes:     Conjunctiva/sclera: Conjunctivae normal.  Neck:     Thyroid: No thyromegaly.     Trachea: Trachea normal. No tracheal tenderness or tracheal deviation.  Cardiovascular:     Rate and Rhythm: Normal rate and regular rhythm.     Heart sounds: Normal heart sounds, S1 normal and S2 normal. No murmur.  Pulmonary:     Effort: No respiratory distress.     Breath sounds: Normal breath sounds. No stridor. No wheezing or rales.  Lymphadenopathy:     Head:     Right side of head: No tonsillar adenopathy.     Left side of head: No tonsillar adenopathy.     Cervical: No cervical adenopathy.  Skin:    Findings: No erythema or rash.     Nails: There is no clubbing.   Neurological:     Mental Status: She is alert.     Diagnostics:    Spirometry was performed and demonstrated an FEV1 of 2.88 at 89 % of predicted.  The patient had an Asthma Control Test with the following results: ACT Total Score: 12.    Assessment and Plan:   1. Moderate persistent asthma with acute exacerbation   2. Perennial allergic rhinitis   3. LPRD (laryngopharyngeal reflux disease)     1.  Continue Symbicort 160 -2 inhalations 1-2 times per day depending on disease activity  2.  Continue Flonase -1-2 sprays each nostril daily  3.  Continue omeprazole 20 mg daily  4.  If needed:   A.  Xopenex HFA 2 inhalations every 4-6 hours  B.  Zyrtec 10 mg daily  C. Nasal saline  5. For this episode:   A. OTC Ibuprofen  B. Add sample QVAR 80 - 2 inhalations 2 times a day to Symbicort  C. Meclizine 12.5 mg every 6 hours if needed  for vertigo  D. Use 'horizon' viewing to help with vertigo  E. Cut back on aggressive nasal saline washes  6.  Return to clinic as previously arranged or earlier if problem  7. Remain away from decongestant use and check blood pressure and heart rate.  Megan Duran has some type of viral induced respiratory tract inflammation that is giving rise to inflammation of her upper and lower airway and eustachian tube with secondary vertigo.  She has already been treated with broad-spectrum antibiotics including coverage of mycoplasma and has been given a  systemic steroid.  We will keep her on anti-inflammatory agents for her airway and introduce a little bit more inhaled steroid until she resolves this issue hopefully over the next week or so.  I did talk to her about vertigo and some of the techniques she can use to help with this issue and she can use meclizine if needed.  I have asked her to cut back on her aggressive nasal saline washes as she may be getting some fluid in through her eustachian tubes to her middle ear.  Finally, she has systemic arterial hypertension and some degree of tachycardia and she is using an oral decongestant and I have asked her to discontinue this agent and make sure that her elevated blood pressure and heart rate is not a trend which would obviously require further evaluation for this issue in the future.  Allena Katz, MD Allergy / Immunology [place]

## 2018-11-16 ENCOUNTER — Encounter: Payer: Self-pay | Admitting: Allergy and Immunology

## 2018-11-16 ENCOUNTER — Telehealth: Payer: Self-pay | Admitting: *Deleted

## 2018-11-16 MED FILL — LEVALBUTEROL TAR HFA 45MCG: 45 | 17 days supply | Qty: 15 | Fill #1

## 2018-11-16 MED FILL — SYMBICORT 160-4.5 MCG INH: 160-4.5 | 30 days supply | Qty: 10 | Fill #1

## 2018-11-16 MED FILL — OMEPRAZOLE 20 MG CPDR: 20 | 90 days supply | Qty: 90 | Fill #1

## 2018-11-16 NOTE — Telephone Encounter (Signed)
Have her submit for short term disability to start the process.

## 2018-11-16 NOTE — Telephone Encounter (Signed)
Megan Duran states that she has missed a lot of work over the last few weeks because of her asthma/sickness. She is requesting that you fill out short term disability forms. Per Anderson Malta, she has been kept out of work because her symptoms are similar to [place] and because her asthma has been as bad as it is. Please advise.

## 2018-11-16 NOTE — Telephone Encounter (Signed)
Megan Duran will begin the process and be in touch with [place].

## 2018-12-02 ENCOUNTER — Other Ambulatory Visit: Payer: Self-pay

## 2018-12-02 ENCOUNTER — Other Ambulatory Visit: Payer: Self-pay | Admitting: Sports Medicine

## 2018-12-02 ENCOUNTER — Ambulatory Visit: Payer: 59 | Admitting: Sports Medicine

## 2018-12-02 ENCOUNTER — Encounter: Payer: Self-pay | Admitting: Sports Medicine

## 2018-12-02 ENCOUNTER — Ambulatory Visit (INDEPENDENT_AMBULATORY_CARE_PROVIDER_SITE_OTHER): Payer: 59

## 2018-12-02 DIAGNOSIS — M216X1 Other acquired deformities of right foot: Secondary | ICD-10-CM | POA: Diagnosis not present

## 2018-12-02 DIAGNOSIS — M722 Plantar fascial fibromatosis: Secondary | ICD-10-CM

## 2018-12-02 DIAGNOSIS — M7661 Achilles tendinitis, right leg: Secondary | ICD-10-CM | POA: Diagnosis not present

## 2018-12-02 MED ORDER — TRIAMCINOLONE ACETONIDE 40 MG/ML IJ SUSP
20.0000 mg | Freq: Once | INTRAMUSCULAR | Status: AC
  Administered 2018-12-02: 20 mg

## 2018-12-02 MED ORDER — PREDNISONE 10 MG (21) PO TBPK: ORAL_TABLET | ORAL | 0 refills | Status: DC

## 2018-12-02 NOTE — Progress Notes (Signed)
Subjective: Megan Duran is a 38 y.o. female patient presents to office with complaint of moderate heel pain on the right.  Patient admits to post static dyskinesia for 3 months in duration. Patient has treated this problem with ibuprofen Aleve taping and Biofreeze reports that the pain is worse after walking on hard surfaces or floors or when she has been busy at work and has had a long shift by the end of her shift and she gets home and takes off her shoes her feet especially the right heel is excruciating and pain sometimes is worse at 9 out of 10 with burning and stabbing to the right heel.  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  Denies any other pedal complaints.   Review of Systems  Musculoskeletal: Positive for joint pain and myalgias.  All other systems reviewed and are negative.    Patient Active Problem List   Diagnosis Date Noted  . Non-recurrent acute serous otitis media of both ears 11/09/2018  . Vaginal delivery 04/01/2017  . Indication for care in labor or delivery 03/31/2017  . Pregnant 09/24/2016  . Iron deficiency 12/22/2015  . Vitamin D deficiency 12/22/2015  . Anemia 12/20/2015  . Asthma 12/20/2015  . Chronic sinusitis 12/20/2015  . Gastroparesis 12/20/2015  . IBS (irritable bowel syndrome) 12/20/2015  . Migraine 12/20/2015  . Osteoarthritis involving multiple joints on both sides of body 12/20/2015    Current Outpatient Medications on File Prior to Visit  Medication Sig Dispense Refill  . cetirizine (ZYRTEC) 10 MG tablet Can take one tablet by mouth once daily if needed. 90 tablet 1  . fluticasone (FLONASE) 50 MCG/ACT nasal spray Use one to two sprays in each nostril once daily as directed. 48 g 1  . levalbuterol (XOPENEX HFA) 45 MCG/ACT inhaler Can use two puffs every four to six hours as needed for cough or wheeze. 3 Inhaler 0  . meclizine (ANTIVERT) 12.5 MG tablet Take one tablet every 6 hours if needed for vertigo. 45 tablet 1  . Multiple Vitamin  (MULTIVITAMIN PO) Take by mouth.    Marland Kitchen omeprazole (PRILOSEC) 20 MG capsule Take 1 capsule (20 mg total) by mouth daily. 90 capsule 1  . SYMBICORT 160-4.5 MCG/ACT inhaler Inhale two puffs twice daily to prevent cough or wheeze.  Rinse, gargle, and spit after use. 3 Inhaler 1  . VITAMIN D PO Take by mouth daily.     No current facility-administered medications on file prior to visit.     Allergies  Allergen Reactions  . Bactrim [Sulfamethoxazole-Trimethoprim] Hives  . Biaxin [Clarithromycin] Hives  . Doxycycline Hives  . Effexor [Venlafaxine] Hives  . Erythromycin Hives  . Imitrex [Sumatriptan] Hives  . Keflex [Cephalexin] Hives  . Minocycline Hives    Objective: Physical Exam General: The patient is alert and oriented x3 in no acute distress.  Dermatology: Skin is warm, dry and supple bilateral lower extremities. Nails 1-10 are normal. There is no erythema, edema, no eccymosis, no open lesions present. Integument is otherwise unremarkable.  Vascular: Dorsalis Pedis pulse and Posterior Tibial pulse are 2/4 bilateral. Capillary fill time is immediate to all digits.  Neurological: Grossly intact to light touch with an achilles reflex of +2/5 and a  negative Tinel's sign bilateral.  Musculoskeletal: Tenderness to palpation at the medial calcaneal tubercale and through the insertion of the plantar fascia on the right foot.  Pain at the Achilles insertion as well on the right.  No pain with compression of calcaneus bilateral. No  pain with tuning fork to calcaneus bilateral. No pain with calf compression bilateral. There is decreased Ankle joint range of motion bilateral. All other joints range of motion within normal limits bilateral. Strength 5/5 in all groups bilateral.   Gait: Unassisted, Antalgic avoid weight on left/right heel  Xray, Right foot:  Normal osseous mineralization. Joint spaces preserved. No fracture/dislocation/boney destruction.  No calcaneal spur present with mild  thickening of plantar fascia. No other soft tissue abnormalities or radiopaque foreign bodies.   Assessment and Plan: Problem List Items Addressed This Visit    None    Visit Diagnoses    Plantar fasciitis, right    -  Primary   Relevant Medications   triamcinolone acetonide (KENALOG-40) injection 20 mg (Completed) (Start on 12/02/2018  9:00 PM)   Tendonitis, Achilles, right       Acquired equinus deformity of right foot          -Complete examination performed.  -Xrays reviewed -Discussed with patient in detail the condition of plantar fasciitis, how this occurs and general treatment options. Explained both conservative and surgical treatments.  -After oral consent and aseptic prep, injected a mixture containing 1 ml of 2%  plain lidocaine, 1 ml 0.5% plain marcaine, 0.5 ml of kenalog 40 and 0.5 ml of dexamethasone phosphate into right heel. Post-injection care discussed with patient.  -Rx prednisone Dosepak -No NSAIDs given due to patient history of gastritis -Recommended good supportive shoes and advised use of OTC insert. Explained to patient that if these orthoses work well, we will continue with these. If these do not improve his/her condition and  pain, we will consider custom molded orthoses. -Patient was fitted for a plantar fascial brace but we did not have an extra-large in stock so this brace was not dispensed at this visit -Explained and dispensed to patient daily stretching exercises. -Recommend patient to ice affected area 1-2x daily. -Patient to return to office in 3 weeks for follow up or sooner if problems or questions arise.  Asencion Islamitorya Azrielle Springsteen, DPM

## 2018-12-02 NOTE — Patient Instructions (Signed)
Plantar Fasciitis Rehab Ask your health care provider which exercises are safe for you. Do exercises exactly as told by your health care provider and adjust them as directed. It is normal to feel mild stretching, pulling, tightness, or discomfort as you do these exercises. Stop right away if you feel sudden pain or your pain gets worse. Do not begin these exercises until told by your health care provider. Stretching and range-of-motion exercises These exercises warm up your muscles and joints and improve the movement and flexibility of your foot. These exercises also help to relieve pain. Plantar fascia stretch  1. Sit with your left / right leg crossed over your opposite knee. 2. Hold your heel with one hand with that thumb near your arch. With your other hand, hold your toes and gently pull them back toward the top of your foot. You should feel a stretch on the bottom of your toes or your foot (plantar fascia) or both. 3. Hold this stretch for__________ seconds. 4. Slowly release your toes and return to the starting position. Repeat __________ times. Complete this exercise __________ times a day. Gastrocnemius stretch, standing This exercise is also called a calf (gastroc) stretch. It stretches the muscles in the back of the upper calf. 1. Stand with your hands against a wall. 2. Extend your left / right leg behind you, and bend your front knee slightly. 3. Keeping your heels on the floor and your back knee straight, shift your weight toward the wall. Do not arch your back. You should feel a gentle stretch in your upper left / right calf. 4. Hold this position for __________ seconds. Repeat __________ times. Complete this exercise __________ times a day. Soleus stretch, standing This exercise is also called a calf (soleus) stretch. It stretches the muscles in the back of the lower calf. 1. Stand with your hands against a wall. 2. Extend your left / right leg behind you, and bend your front  knee slightly. 3. Keeping your heels on the floor, bend your back knee and shift your weight slightly over your back leg. You should feel a gentle stretch deep in your lower calf. 4. Hold this position for __________ seconds. Repeat __________ times. Complete this exercise __________ times a day. Gastroc and soleus stretch, standing step This exercise stretches the muscles in the back of the lower leg. These muscles are in the upper calf (gastrocnemius) and the lower calf (soleus). 1. Stand with the ball of your left / right foot on a step. The ball of your foot is on the walking surface, right under your toes. 2. Keep your other foot firmly on the same step. 3. Hold on to the wall or a railing for balance. 4. Slowly lift your other foot, allowing your body weight to press your left / right heel down over the edge of the step. You should feel a stretch in your left / right calf. 5. Hold this position for __________ seconds. 6. Return both feet to the step. 7. Repeat this exercise with a slight bend in your left / right knee. Repeat __________ times with your left / right knee straight and __________ times with your left / right knee bent. Complete this exercise __________ times a day. Balance exercise This exercise builds your balance and strength control of your arch to help take pressure off your plantar fascia. Single leg stand If this exercise is too easy, you can try it with your eyes closed or while standing on a pillow. 1.   Without shoes, stand near a railing or in a doorway. You may hold on to the railing or door frame as needed. 2. Stand on your left / right foot. Keep your big toe down on the floor and try to keep your arch lifted. Do not let your foot roll inward. 3. Hold this position for __________ seconds. Repeat __________ times. Complete this exercise __________ times a day. This information is not intended to replace advice given to you by your health care provider. Make sure  you discuss any questions you have with your health care provider. Document Released: 04/29/2005 Document Revised: 08/20/2018 Document Reviewed: 02/25/2018 Elsevier Patient Education  2020 [employer]  

## 2018-12-03 ENCOUNTER — Telehealth: Payer: Self-pay | Admitting: Allergy and Immunology

## 2018-12-03 NOTE — Telephone Encounter (Signed)
Patient called to check on status of letter. She needs to have it by tomorrow morning at the latest.

## 2018-12-03 NOTE — Telephone Encounter (Signed)
Please write note and forward to me

## 2018-12-03 NOTE — Telephone Encounter (Signed)
Letter has been written and signed. Patient will come by today and pick it up.

## 2018-12-03 NOTE — Telephone Encounter (Signed)
Megan Duran called in and states that [employer] [employer] needs a letter from Dr. 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  Nathalya states it needs to be on a letter head and she needs it today or tomorrow the latest.  Please advise.

## 2018-12-09 ENCOUNTER — Other Ambulatory Visit: Payer: Self-pay

## 2018-12-09 ENCOUNTER — Ambulatory Visit (INDEPENDENT_AMBULATORY_CARE_PROVIDER_SITE_OTHER): Payer: 59 | Admitting: Sports Medicine

## 2018-12-09 ENCOUNTER — Encounter: Payer: Self-pay | Admitting: Sports Medicine

## 2018-12-09 VITALS — Temp 97.4°F | Resp 16

## 2018-12-09 DIAGNOSIS — M722 Plantar fascial fibromatosis: Secondary | ICD-10-CM

## 2018-12-09 DIAGNOSIS — M7661 Achilles tendinitis, right leg: Secondary | ICD-10-CM | POA: Diagnosis not present

## 2018-12-09 DIAGNOSIS — M216X1 Other acquired deformities of right foot: Secondary | ICD-10-CM

## 2018-12-09 MED ORDER — DICLOFENAC SODIUM 1 % TD GEL: 4.0000 g | Freq: Four times a day (QID) | TRANSDERMAL | 1 refills | Status: DC

## 2018-12-09 NOTE — Progress Notes (Signed)
Subjective: Megan Duran is a 38 y.o. female patient presents to office with complaint of moderate heel pain on the right.  Patient reports that the pain on the bottom is gone has most pain in the back of the heel and burning sensation that worsens with activity reports that she has been using a water bottle and icing and taking her naproxen.  Reports that she finished her steroid by mouth at some swelling and a small water blister and wanted to have her foot checked because of the intense burning at the back.  Patient denies any new trauma or injury.  No other pedal complaints noted.  Patient Active Problem List   Diagnosis Date Noted  . Non-recurrent acute serous otitis media of both ears 11/09/2018  . Vaginal delivery 04/01/2017  . Indication for care in labor or delivery 03/31/2017  . Pregnant 09/24/2016  . Iron deficiency 12/22/2015  . Vitamin D deficiency 12/22/2015  . Anemia 12/20/2015  . Asthma 12/20/2015  . Chronic sinusitis 12/20/2015  . Gastroparesis 12/20/2015  . IBS (irritable bowel syndrome) 12/20/2015  . Migraine 12/20/2015  . Osteoarthritis involving multiple joints on both sides of body 12/20/2015    Current Outpatient Medications on File Prior to Visit  Medication Sig Dispense Refill  . cetirizine (ZYRTEC) 10 MG tablet Can take one tablet by mouth once daily if needed. 90 tablet 1  . fluticasone (FLONASE) 50 MCG/ACT nasal spray Use one to two sprays in each nostril once daily as directed. 48 g 1  . levalbuterol (XOPENEX HFA) 45 MCG/ACT inhaler Can use two puffs every four to six hours as needed for cough or wheeze. 3 Inhaler 0  . meclizine (ANTIVERT) 12.5 MG tablet Take one tablet every 6 hours if needed for vertigo. 45 tablet 1  . Multiple Vitamin (MULTIVITAMIN PO) Take by mouth.    Marland Kitchen omeprazole (PRILOSEC) 20 MG capsule Take 1 capsule (20 mg total) by mouth daily. 90 capsule 1  . predniSONE (STERAPRED UNI-PAK 21 TAB) 10 MG (21) TBPK tablet Take as directed 21  tablet 0  . SYMBICORT 160-4.5 MCG/ACT inhaler Inhale two puffs twice daily to prevent cough or wheeze.  Rinse, gargle, and spit after use. 3 Inhaler 1  . VITAMIN D PO Take by mouth daily.     No current facility-administered medications on file prior to visit.     Allergies  Allergen Reactions  . Bactrim [Sulfamethoxazole-Trimethoprim] Hives  . Biaxin [Clarithromycin] Hives  . Doxycycline Hives  . Effexor [Venlafaxine] Hives  . Erythromycin Hives  . Imitrex [Sumatriptan] Hives  . Keflex [Cephalexin] Hives  . Minocycline Hives    Objective: Physical Exam General: The patient is alert and oriented x3 in no acute distress.  Dermatology: Skin is warm, dry and supple bilateral lower extremities. Nails 1-10 are normal. There is no erythema, edema, no eccymosis, no open lesions present. Integument is otherwise unremarkable.  Vascular: Dorsalis Pedis pulse and Posterior Tibial pulse are 2/4 bilateral. Capillary fill time is immediate to all digits.  Neurological: Grossly intact to light touch with an achilles reflex of +2/5 and a  negative Tinel's sign bilateral.  Subjective burning to the posterior aspect of the right heel.  Musculoskeletal: Tenderness to palpation at Achilles insertion on right.  There is reduced pain to plantar fascial insertion on right.  No pain with compression of calcaneus bilateral. No pain with tuning fork to calcaneus bilateral. No pain with calf compression bilateral. There is decreased Ankle joint range of motion bilateral. All  other joints range of motion within normal limits bilateral. Strength 5/5 in all groups bilateral.   Assessment and Plan: Problem List Items Addressed This Visit    None    Visit Diagnoses    Tendonitis, Achilles, right    -  Primary   Plantar fasciitis, right       Acquired equinus deformity of right foot          -Complete examination performed.  -Discussed with patient in detail the condition of residual Achilles tendinitis  and plantar fasciitis, how this occurs and general treatment options. Explained both conservative and surgical treatments.  -Rx Diclofenac to use -Dispensed night splint to use at bedtime and CAM boot to use during the day. If after 1-2 weeks it is doing better may transition back to brace with tennis shoe -Patient to return to office in 2 weeks for follow up or sooner if problems or questions arise.  Asencion Islamitorya Shalese Strahan, DPM

## 2018-12-16 ENCOUNTER — Other Ambulatory Visit: Payer: Self-pay | Admitting: Sports Medicine

## 2018-12-16 ENCOUNTER — Telehealth: Payer: Self-pay | Admitting: *Deleted

## 2018-12-16 DIAGNOSIS — M7661 Achilles tendinitis, right leg: Secondary | ICD-10-CM

## 2018-12-16 DIAGNOSIS — M722 Plantar fascial fibromatosis: Secondary | ICD-10-CM

## 2018-12-16 DIAGNOSIS — M79671 Pain in right foot: Secondary | ICD-10-CM

## 2018-12-16 MED ORDER — TRAMADOL HCL 50 MG PO TABS: 50.0000 mg | ORAL_TABLET | Freq: Three times a day (TID) | ORAL | 0 refills | Status: AC | PRN

## 2018-12-16 NOTE — Progress Notes (Signed)
Sent Rx to pharmacy  -Dr. Zita Ozimek  

## 2018-12-16 NOTE — Telephone Encounter (Signed)
Patient called to report her 1 wk fu stating that she is still having pain especially at night and requested the Tramadol rx.  Please advise

## 2018-12-16 NOTE — Telephone Encounter (Signed)
Patient notified

## 2018-12-16 NOTE — Telephone Encounter (Signed)
Sent to [facility]

## 2018-12-24 ENCOUNTER — Encounter: Payer: Self-pay | Admitting: Sports Medicine

## 2018-12-24 ENCOUNTER — Ambulatory Visit: Payer: 59 | Admitting: Sports Medicine

## 2018-12-24 ENCOUNTER — Other Ambulatory Visit: Payer: Self-pay

## 2018-12-24 VITALS — Temp 97.8°F | Resp 16

## 2018-12-24 DIAGNOSIS — M79671 Pain in right foot: Secondary | ICD-10-CM

## 2018-12-24 DIAGNOSIS — M7661 Achilles tendinitis, right leg: Secondary | ICD-10-CM

## 2018-12-24 DIAGNOSIS — M722 Plantar fascial fibromatosis: Secondary | ICD-10-CM

## 2018-12-24 DIAGNOSIS — M216X1 Other acquired deformities of right foot: Secondary | ICD-10-CM

## 2018-12-24 MED ORDER — TRIAMCINOLONE ACETONIDE 40 MG/ML IJ SUSP
20.0000 mg | Freq: Once | INTRAMUSCULAR | Status: AC
  Administered 2018-12-24: 20 mg

## 2018-12-24 NOTE — Progress Notes (Signed)
Subjective: Megan Duran is a 38 y.o. female patient returns to office with complaint of moderate heel pain on the right.  Patient reports that the pain at her right heel is doing better reports that when she is not on her foot a lot the pain is about 4 out of 10 however after a 12-hour shift the pain can escalate to 7 out of 10 with some swelling reports that there is very minimal pain on the bottom most pain is at the back along the Achilles reports that even with the cam boot sometimes she still gets some pain at the back of the Achilles has been using her cam boot at work and on off days or weekends and has been using her plantar fascial brace and her night splint daily has been also stretching icing has completed diclofenac and has supplemented with ibuprofen and has been using compression sock without complete resolution however does admit that she feels like things are slowly getting better.  Patient denies any new issues since last encounter.  No other pedal complaints noted.  Patient Active Problem List   Diagnosis Date Noted  . Non-recurrent acute serous otitis media of both ears 11/09/2018  . Vaginal delivery 04/01/2017  . Indication for care in labor or delivery 03/31/2017  . Pregnant 09/24/2016  . Iron deficiency 12/22/2015  . Vitamin D deficiency 12/22/2015  . Anemia 12/20/2015  . Asthma 12/20/2015  . Chronic sinusitis 12/20/2015  . Gastroparesis 12/20/2015  . IBS (irritable bowel syndrome) 12/20/2015  . Migraine 12/20/2015  . Osteoarthritis involving multiple joints on both sides of body 12/20/2015    Current Outpatient Medications on File Prior to Visit  Medication Sig Dispense Refill  . cetirizine (ZYRTEC) 10 MG tablet Can take one tablet by mouth once daily if needed. 90 tablet 1  . diclofenac sodium (VOLTAREN) 1 % GEL Apply 4 g topically 4 (four) times daily. 150 g 1  . fluticasone (FLONASE) 50 MCG/ACT nasal spray Use one to two sprays in each nostril once daily as  directed. 48 g 1  . levalbuterol (XOPENEX HFA) 45 MCG/ACT inhaler Can use two puffs every four to six hours as needed for cough or wheeze. 3 Inhaler 0  . meclizine (ANTIVERT) 12.5 MG tablet Take one tablet every 6 hours if needed for vertigo. 45 tablet 1  . Multiple Vitamin (MULTIVITAMIN PO) Take by mouth.    Marland Kitchen. omeprazole (PRILOSEC) 20 MG capsule Take 1 capsule (20 mg total) by mouth daily. 90 capsule 1  . predniSONE (STERAPRED UNI-PAK 21 TAB) 10 MG (21) TBPK tablet Take as directed 21 tablet 0  . SYMBICORT 160-4.5 MCG/ACT inhaler Inhale two puffs twice daily to prevent cough or wheeze.  Rinse, gargle, and spit after use. 3 Inhaler 1  . VITAMIN D PO Take by mouth daily.     No current facility-administered medications on file prior to visit.     Allergies  Allergen Reactions  . Bactrim [Sulfamethoxazole-Trimethoprim] Hives  . Biaxin [Clarithromycin] Hives  . Doxycycline Hives  . Effexor [Venlafaxine] Hives  . Erythromycin Hives  . Imitrex [Sumatriptan] Hives  . Keflex [Cephalexin] Hives  . Minocycline Hives    Objective: Physical Exam General: The patient is alert and oriented x3 in no acute distress.  Dermatology: Skin is warm, dry and supple bilateral lower extremities. Nails 1-10 are normal. There is no erythema, edema, no eccymosis, no open lesions present. Integument is otherwise unremarkable.  Vascular: Dorsalis Pedis pulse and Posterior Tibial pulse are  2/4 bilateral. Capillary fill time is immediate to all digits.  Neurological: Grossly intact to light touch with an achilles reflex of +2/5 and a  negative Tinel's sign bilateral.  Subjective burning to the posterior aspect of the right heel.  Musculoskeletal: Tenderness to palpation at Achilles insertion on right.  There is slightly increased pain to plantar fascial insertion on right.  No pain with compression of calcaneus bilateral. No pain with tuning fork to calcaneus bilateral. No pain with calf compression bilateral.  There is decreased Ankle joint range of motion bilateral. All other joints range of motion within normal limits bilateral. Strength 5/5 in all groups bilateral.   Assessment and Plan: Problem List Items Addressed This Visit    None    Visit Diagnoses    Tendonitis, Achilles, right    -  Primary   Plantar fasciitis, right       Relevant Medications   triamcinolone acetonide (KENALOG-40) injection 20 mg (Completed) (Start on 12/24/2018  8:45 PM)   Right foot pain       Relevant Medications   triamcinolone acetonide (KENALOG-40) injection 20 mg (Completed) (Start on 12/24/2018  8:45 PM)   Acquired equinus deformity of right foot          -Complete examination performed.  -Re-Discussed with patient in detail the condition of residual Achilles tendinitis and plantar fasciitis, how this occurs and general treatment options. Explained both conservative and surgical treatments.  -After oral consent and aseptic prep, injected a mixture containing 1 ml of 2%  plain lidocaine, 1 ml 0.5% plain marcaine, 0.5 ml of kenalog 40 and 0.5 ml of dexamethasone phosphate into right heel via plantar approach at the plantar fascia without complication. Post-injection care discussed with patient.  -Continue with night splint to use at bedtime and CAM boot to use during the day when at work.  Advised patient when she is not at work may still continue with plantar fascial brace and tennis shoe -Advised patient to consider physical therapy versus shockwave treatment if pain continues to flare or worsen -Patient to return to call office if her pain is not better after 2 weeks for follow up or sooner if problems or questions arise.  Landis Martins, DPM

## 2019-01-25 DIAGNOSIS — R509 Fever, unspecified: Secondary | ICD-10-CM | POA: Diagnosis not present

## 2019-01-25 DIAGNOSIS — N3001 Acute cystitis with hematuria: Secondary | ICD-10-CM | POA: Diagnosis not present

## 2019-01-25 DIAGNOSIS — N3091 Cystitis, unspecified with hematuria: Secondary | ICD-10-CM | POA: Diagnosis not present

## 2019-02-05 ENCOUNTER — Other Ambulatory Visit: Payer: Self-pay | Admitting: Allergy and Immunology

## 2019-02-05 MED FILL — OMEPRAZOLE 20 MG CAP: 20 | 90 days supply | Qty: 90 | Fill #0

## 2019-02-11 ENCOUNTER — Ambulatory Visit: Payer: 59 | Admitting: Sports Medicine

## 2019-02-11 ENCOUNTER — Encounter: Payer: Self-pay | Admitting: Sports Medicine

## 2019-02-11 ENCOUNTER — Other Ambulatory Visit: Payer: Self-pay

## 2019-02-11 DIAGNOSIS — M722 Plantar fascial fibromatosis: Secondary | ICD-10-CM | POA: Diagnosis not present

## 2019-02-11 DIAGNOSIS — M79671 Pain in right foot: Secondary | ICD-10-CM

## 2019-02-11 DIAGNOSIS — M7661 Achilles tendinitis, right leg: Secondary | ICD-10-CM

## 2019-02-11 MED ORDER — TRIAMCINOLONE ACETONIDE 40 MG/ML IJ SUSP
20.0000 mg | Freq: Once | INTRAMUSCULAR | Status: AC
  Administered 2019-02-11: 20 mg

## 2019-02-11 NOTE — Progress Notes (Signed)
Subjective: Megan Duran is a 38 y.o. female patient returns to office with complaint of moderate heel pain on the right.  Patient reports that the pain is about the same not getting any better reports that after the last injection she had some relief temporarily but slowly the pain has creep back up to 7 out of 10 patient reports pain is worse after a.  And she goes to stand and after being on her feet a lot at work reports that she is even been wearing her cam boot at work which seems to help a little bit however the boot is tearing and loosening and she has not been wearing it lately.  Patient denies any changes with medical history or any other constitutional symptoms at this time does admit however that there is some warmth to the top of the foot when she is swelling otherwise no redness no nausea vomiting fever chills or any other constitutional symptoms at this time.  No other pedal complaints noted.  Patient Active Problem List   Diagnosis Date Noted  . Non-recurrent acute serous otitis media of both ears 11/09/2018  . Vaginal delivery 04/01/2017  . Indication for care in labor or delivery 03/31/2017  . Pregnant 09/24/2016  . Iron deficiency 12/22/2015  . Vitamin D deficiency 12/22/2015  . Anemia 12/20/2015  . Asthma 12/20/2015  . Chronic sinusitis 12/20/2015  . Gastroparesis 12/20/2015  . IBS (irritable bowel syndrome) 12/20/2015  . Migraine 12/20/2015  . Osteoarthritis involving multiple joints on both sides of body 12/20/2015    Current Outpatient Medications on File Prior to Visit  Medication Sig Dispense Refill  . cetirizine (ZYRTEC) 10 MG tablet Can take one tablet by mouth once daily if needed. 90 tablet 1  . diclofenac sodium (VOLTAREN) 1 % GEL Apply 4 g topically 4 (four) times daily. 150 g 1  . fluticasone (FLONASE) 50 MCG/ACT nasal spray Use one to two sprays in each nostril once daily as directed. 48 g 1  . levalbuterol (XOPENEX HFA) 45 MCG/ACT inhaler Can use two  puffs every four to six hours as needed for cough or wheeze. 3 Inhaler 0  . meclizine (ANTIVERT) 12.5 MG tablet Take one tablet every 6 hours if needed for vertigo. 45 tablet 1  . Multiple Vitamin (MULTIVITAMIN PO) Take by mouth.    Marland Kitchen omeprazole (PRILOSEC) 20 MG capsule TAKE 1 CAPSULE (20 MG TOTAL) BY MOUTH DAILY. 90 capsule 1  . predniSONE (STERAPRED UNI-PAK 21 TAB) 10 MG (21) TBPK tablet Take as directed 21 tablet 0  . SYMBICORT 160-4.5 MCG/ACT inhaler Inhale two puffs twice daily to prevent cough or wheeze.  Rinse, gargle, and spit after use. 3 Inhaler 1  . VITAMIN D PO Take by mouth daily.     No current facility-administered medications on file prior to visit.     Allergies  Allergen Reactions  . Bactrim [Sulfamethoxazole-Trimethoprim] Hives  . Biaxin [Clarithromycin] Hives  . Doxycycline Hives  . Effexor [Venlafaxine] Hives  . Erythromycin Hives  . Imitrex [Sumatriptan] Hives  . Keflex [Cephalexin] Hives  . Minocycline Hives    Objective: Physical Exam General: The patient is alert and oriented x3 in no acute distress.  Dermatology: Skin is warm, dry and supple bilateral lower extremities. Nails 1-10 are normal. There is no erythema, edema, no eccymosis, no open lesions present. Integument is otherwise unremarkable.  Vascular: Dorsalis Pedis pulse and Posterior Tibial pulse are 2/4 bilateral. Capillary fill time is immediate to all digits.  Neurological:  Grossly intact to light touch with an achilles reflex of +2/5 and a  negative Tinel's sign bilateral.  Subjective burning to the posterior aspect of the right heel.  Musculoskeletal: Tenderness to palpation at Achilles insertion on right.  There is slightly increased pain to plantar fascial insertion on right.  No pain with compression of calcaneus bilateral. No pain with tuning fork to calcaneus bilateral. No pain with calf compression bilateral. There is decreased Ankle joint range of motion bilateral. All other joints range  of motion within normal limits bilateral. Strength 5/5 in all groups bilateral.   Assessment and Plan: Problem List Items Addressed This Visit    None    Visit Diagnoses    Plantar fasciitis, right    -  Primary   Tendonitis, Achilles, right       Right foot pain          -Complete examination performed.  -Re-Discussed with patient in detail the condition of residual Achilles tendinitis and plantar fasciitis, how this occurs and general treatment options. Explained both conservative and surgical treatments.  -After oral consent and aseptic prep, injected a mixture containing 1 ml of 2%  plain lidocaine, 1 ml 0.5% plain marcaine, 0.5 ml of kenalog 40 and 0.5 ml of dexamethasone phosphate into right heel via plantar approach at the plantar fascia without complication. Post-injection care discussed with patient.  This is injection #3 to the area. -Continue with night splint to use at bedtime and CAM boot to use during the day when at work as needed knee replacement boot given with toe guard this visit -Patient also desires to start shockwave treatment we will arrange this for her to do in [place], [place]

## 2019-02-16 DIAGNOSIS — J0100 Acute maxillary sinusitis, unspecified: Secondary | ICD-10-CM | POA: Diagnosis not present

## 2019-02-16 DIAGNOSIS — R110 Nausea: Secondary | ICD-10-CM | POA: Diagnosis not present

## 2019-02-22 ENCOUNTER — Other Ambulatory Visit: Payer: 59

## 2019-04-29 MED FILL — OMEPRAZOLE 20 MG CAP: 20 | 90 days supply | Qty: 90 | Fill #1

## 2019-04-30 ENCOUNTER — Other Ambulatory Visit: Payer: Self-pay

## 2019-04-30 ENCOUNTER — Ambulatory Visit (INDEPENDENT_AMBULATORY_CARE_PROVIDER_SITE_OTHER): Payer: Self-pay | Admitting: *Deleted

## 2019-04-30 DIAGNOSIS — M7661 Achilles tendinitis, right leg: Secondary | ICD-10-CM

## 2019-04-30 DIAGNOSIS — M722 Plantar fascial fibromatosis: Secondary | ICD-10-CM

## 2019-04-30 DIAGNOSIS — M79676 Pain in unspecified toe(s): Secondary | ICD-10-CM

## 2019-04-30 NOTE — Patient Instructions (Signed)

## 2019-04-30 NOTE — Progress Notes (Signed)
Patient presents for the 1st EPAT treatment today with complaint of plantar and posterior heel pain right. Diagnosed with plantar fasciitis and achilles tendonitis by Dr. Cannon Kettle. This has been ongoing for several months. The patient has tried ice, stretching, NSAIDS and supportive shoe gear with no long term relief. Most of the pain is located plantar/medial heel, posterior heel and arch.  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  ESWT administered and tolerated well. Treatment settings initiated at:   Energy: 20  Ended treatment session today with 3000 shocks at the following settings:   Energy: 20  Frequency: 5.0  Joules: 19.62 (plantar/posterior)  Arch massager was utilized x 3 rounds  Advised to avoid ice and NSAIDs and to utilize boot or supportive shoes for at least the next 3 days.  She does have a night splint and a cam boot. She tries to wear her night splint about 2 hours before bed every night, unable to wear while she sleeps. She will wear her cam boot as needed.  Follow up for 2nd treatment in 1 week.

## 2019-05-10 ENCOUNTER — Other Ambulatory Visit: Payer: Self-pay

## 2019-05-10 ENCOUNTER — Ambulatory Visit (INDEPENDENT_AMBULATORY_CARE_PROVIDER_SITE_OTHER): Payer: 59 | Admitting: *Deleted

## 2019-05-10 DIAGNOSIS — M722 Plantar fascial fibromatosis: Secondary | ICD-10-CM

## 2019-05-10 DIAGNOSIS — M7661 Achilles tendinitis, right leg: Secondary | ICD-10-CM

## 2019-05-10 DIAGNOSIS — M79676 Pain in unspecified toe(s): Secondary | ICD-10-CM

## 2019-05-10 NOTE — Progress Notes (Signed)
Patient presents for the 2nd EPAT treatment today with complaint of plantar and posterior heel pain right. Diagnosed with plantar fasciitis and achilles tendonitis by Dr. Cannon Kettle. This has been ongoing for several months. The patient has tried ice, stretching, NSAIDS and supportive shoe gear with no long term relief. Most of the pain is located plantar/medial heel and arch. Patient states after her first treatment she was in a lot of pain, that she even had to miss work a day. She did utilize her CAM boot and take Tylenol. She notices a lot of the pain is very sharp to the lateral side of the foot.   ESWT administered and tolerated well. Treatment settings initiated at:   Energy: 25  Ended treatment session today with 3000 shocks at the following settings:   Energy: 25  Frequency: 4.0  Joules: 24.52 (plantar/posterior)  Biomedical engineer was utilized x 3 rounds  She was having a sharp, shooting pain to the lateral side as I was administering treatment to the medial and lateral bands plantarly.   Advised to avoid ice and NSAIDs and to utilize boot or supportive shoes for at least the next 3 days.  Follow up for 3rd treatment in 2 weeks due to the [place] holiday.

## 2019-05-24 ENCOUNTER — Other Ambulatory Visit: Payer: Self-pay

## 2019-05-24 ENCOUNTER — Ambulatory Visit (INDEPENDENT_AMBULATORY_CARE_PROVIDER_SITE_OTHER): Payer: 59 | Admitting: *Deleted

## 2019-05-24 DIAGNOSIS — M7661 Achilles tendinitis, right leg: Secondary | ICD-10-CM

## 2019-05-24 DIAGNOSIS — M722 Plantar fascial fibromatosis: Secondary | ICD-10-CM

## 2019-05-24 DIAGNOSIS — M79676 Pain in unspecified toe(s): Secondary | ICD-10-CM

## 2019-05-24 NOTE — Progress Notes (Addendum)
Patient presents for the 3rd EPAT treatment today with complaint of plantar and posterior heel pain right. Diagnosed with plantar fasciitis and achilles tendonitis by Dr. Marylene Land. This has been ongoing for several months. The patient has tried ice, stretching, NSAIDS and supportive shoe gear with no long term relief. Most of the pain is located plantar/medial heel and arch and posterior heel. She has also been experiencing some burning sensations in the achilles over the last couple weeks. The intensity of pain in the plantar heel has decreased, but still having a lot of discomfort posteriorly.   ESWT administered and tolerated well. Treatment settings initiated at:   Energy: 30 (plantar)    25 (posterior) x 2000 shocks  Ended treatment session today with 3000 shocks at the following settings:   Energy: 30 (plantar)    30 (posterior) x 1000 shocks  Frequency: 4.0  Joules: 29.43 (plantar)    26.16 (posterior)  She tolerated treatment much better today than she has her past 2 visits.  Arch massager was utilized x 3 rounds  Advised to avoid ice and NSAIDs and to utilize boot or supportive shoes for at least the next 3 days.  Follow up for 4th treatment in 2 weeks.  See Dr. Marylene Land in 2-4 weeks after 4th treatment depending on level of pain at that time.

## 2019-06-07 ENCOUNTER — Ambulatory Visit (INDEPENDENT_AMBULATORY_CARE_PROVIDER_SITE_OTHER): Payer: 59 | Admitting: *Deleted

## 2019-06-07 ENCOUNTER — Other Ambulatory Visit: Payer: Self-pay

## 2019-06-07 DIAGNOSIS — B351 Tinea unguium: Secondary | ICD-10-CM

## 2019-06-07 DIAGNOSIS — M7661 Achilles tendinitis, right leg: Secondary | ICD-10-CM

## 2019-06-07 DIAGNOSIS — M722 Plantar fascial fibromatosis: Secondary | ICD-10-CM

## 2019-06-07 NOTE — Progress Notes (Addendum)
Patient presents for the 4th EPAT treatment today with complaint of plantar and posterior heel pain right. Diagnosed with plantar fasciitis and achilles tendonitis by Dr. Marylene Land. This has been ongoing for several months. The patient has tried ice, stretching, NSAIDS and supportive shoe gear with no long term relief. Most of the pain is located plantar/medial heel and arch and posterior heel. Patient has seen very little improvement. Her pain is based upon her activity level. She still relates having a lot of burning sensation after her 12 hour shift at work. She said at her last shift her achilles was very red and inflamed.   ESWT administered and tolerated well. Treatment settings initiated at:   Energy: 35  Ended treatment session today with 3000 shocks at the following settings:   Energy: 35 (plantar and posterior)  Frequency: 4.0  Joules: 34.33   Arch massager was utilized x 3 rounds  Advised to avoid ice and NSAIDs and to utilize boot or supportive shoes for at least the next 3 days.  Follow up with Dr. Marylene Land in 3 weeks for re-evaluation and other treatment options.

## 2019-07-01 ENCOUNTER — Ambulatory Visit: Payer: 59 | Admitting: Sports Medicine

## 2019-07-08 ENCOUNTER — Encounter: Payer: Self-pay | Admitting: Sports Medicine

## 2019-07-08 ENCOUNTER — Ambulatory Visit: Payer: 59 | Admitting: Sports Medicine

## 2019-07-08 ENCOUNTER — Other Ambulatory Visit: Payer: Self-pay

## 2019-07-08 DIAGNOSIS — M216X1 Other acquired deformities of right foot: Secondary | ICD-10-CM | POA: Diagnosis not present

## 2019-07-08 DIAGNOSIS — M7661 Achilles tendinitis, right leg: Secondary | ICD-10-CM

## 2019-07-08 DIAGNOSIS — M79671 Pain in right foot: Secondary | ICD-10-CM | POA: Diagnosis not present

## 2019-07-08 DIAGNOSIS — M722 Plantar fascial fibromatosis: Secondary | ICD-10-CM

## 2019-07-08 MED ORDER — PREDNISONE 10 MG (21) PO TBPK: ORAL_TABLET | ORAL | 0 refills | Status: DC

## 2019-07-08 NOTE — Progress Notes (Signed)
Subjective: Megan Duran is a 39 y.o. female patient returns to office for follow up evaluation of right heel pain. Patient had 4 treatments of EPAT and reports that it was painful especially after treatments but now it feels like its getting slowly better 3-7/10 to the right heel. Reports that sometimes she still has to use her CAM boot when at work. No other pedal complaints noted.  Patient Active Problem List   Diagnosis Date Noted  . Non-recurrent acute serous otitis media of both ears 11/09/2018  . Vaginal delivery 04/01/2017  . Indication for care in labor or delivery 03/31/2017  . Pregnant 09/24/2016  . Iron deficiency 12/22/2015  . Vitamin D deficiency 12/22/2015  . Anemia 12/20/2015  . Asthma 12/20/2015  . Chronic sinusitis 12/20/2015  . Gastroparesis 12/20/2015  . IBS (irritable bowel syndrome) 12/20/2015  . Migraine 12/20/2015  . Osteoarthritis involving multiple joints on both sides of body 12/20/2015    Current Outpatient Medications on File Prior to Visit  Medication Sig Dispense Refill  . cetirizine (ZYRTEC) 10 MG tablet Can take one tablet by mouth once daily if needed. 90 tablet 1  . diclofenac sodium (VOLTAREN) 1 % GEL Apply 4 g topically 4 (four) times daily. 150 g 1  . fluticasone (FLONASE) 50 MCG/ACT nasal spray Use one to two sprays in each nostril once daily as directed. 48 g 1  . levalbuterol (XOPENEX HFA) 45 MCG/ACT inhaler Can use two puffs every four to six hours as needed for cough or wheeze. 3 Inhaler 0  . levonorgestrel (MIRENA, 52 MG,) 20 MCG/24HR IUD Mirena 20 mcg/24 hours (6 yrs) 52 mg intrauterine device  Take 1 device by intrauterine route.    . meclizine (ANTIVERT) 12.5 MG tablet Take one tablet every 6 hours if needed for vertigo. 45 tablet 1  . Multiple Vitamin (MULTIVITAMIN PO) Take by mouth.    Marland Kitchen omeprazole (PRILOSEC) 20 MG capsule TAKE 1 CAPSULE (20 MG TOTAL) BY MOUTH DAILY. 90 capsule 1  . phenazopyridine (PYRIDIUM) 200 MG tablet TAKE 1  TABLET EVERY 8 HOURS AS NEEDED FOR DYSURIA    . SYMBICORT 160-4.5 MCG/ACT inhaler Inhale two puffs twice daily to prevent cough or wheeze.  Rinse, gargle, and spit after use. 3 Inhaler 1  . VITAMIN D PO Take by mouth daily.     No current facility-administered medications on file prior to visit.    Allergies  Allergen Reactions  . Bactrim [Sulfamethoxazole-Trimethoprim] Hives  . Biaxin [Clarithromycin] Hives  . Doxycycline Hives  . Effexor [Venlafaxine] Hives  . Erythromycin Hives  . Imitrex [Sumatriptan] Hives  . Keflex [Cephalexin] Hives  . Minocycline Hives    Objective: Physical Exam General: The patient is alert and oriented x3 in no acute distress.  Dermatology: Skin is warm, dry and supple bilateral lower extremities. Nails 1-10 are normal. There is no erythema, edema, no eccymosis, no open lesions present. Integument is otherwise unremarkable.  Vascular: Dorsalis Pedis pulse and Posterior Tibial pulse are 2/4 bilateral. Capillary fill time is immediate to all digits.  Neurological: Grossly intact to light touch with an achilles reflex of +2/5 and a  negative Tinel's sign bilateral.  Subjective burning to the posterior aspect of the right heel that is improving.  Musculoskeletal: Mild tenderness to palpation at Achilles insertion on right.  There is mild pain to plantar fascial insertion on right.  No pain with compression of calcaneus bilateral. No pain with tuning fork to calcaneus bilateral. No pain with calf compression bilateral.  There is decreased Ankle joint range of motion bilateral. All other joints range of motion within normal limits bilateral. Strength 5/5 in all groups bilateral.   Assessment and Plan: Problem List Items Addressed This Visit    None    Visit Diagnoses    Plantar fasciitis, right    -  Primary   Tendonitis, Achilles, right       Acquired equinus deformity of right foot       Right foot pain          -Complete examination performed.   -Re-Discussed with patient in detail the condition of residual Achilles tendinitis and plantar fasciitis, how this occurs and general treatment options. Explained both conservative and surgical treatments.  -Advised patient that it is normal to have increased pain after EPAT and since her symptoms were chronic may take longer for her body to heal or to get a response  -Rx Prednisone dose pak if pain flare -Continue with stretching, rest, ice, elevation and Aleve PRN -Continue with night splint to use at bedtime and CAM boot to use during the day when needed -Patient to return to office if symptoms fail to improve  Asencion Islam, DPM

## 2020-01-18 DIAGNOSIS — R519 Headache, unspecified: Secondary | ICD-10-CM | POA: Diagnosis not present

## 2020-01-18 DIAGNOSIS — Z20828 Contact with and (suspected) exposure to other viral communicable diseases: Secondary | ICD-10-CM | POA: Diagnosis not present

## 2020-01-18 DIAGNOSIS — R05 Cough: Secondary | ICD-10-CM | POA: Diagnosis not present

## 2020-01-18 DIAGNOSIS — J0100 Acute maxillary sinusitis, unspecified: Secondary | ICD-10-CM | POA: Diagnosis not present

## 2020-04-22 DIAGNOSIS — J0100 Acute maxillary sinusitis, unspecified: Secondary | ICD-10-CM | POA: Diagnosis not present

## 2020-04-22 DIAGNOSIS — J45909 Unspecified asthma, uncomplicated: Secondary | ICD-10-CM | POA: Diagnosis not present

## 2020-04-22 DIAGNOSIS — Z20828 Contact with and (suspected) exposure to other viral communicable diseases: Secondary | ICD-10-CM | POA: Diagnosis not present

## 2020-06-01 DIAGNOSIS — Z20828 Contact with and (suspected) exposure to other viral communicable diseases: Secondary | ICD-10-CM | POA: Diagnosis not present

## 2020-06-01 DIAGNOSIS — R0981 Nasal congestion: Secondary | ICD-10-CM | POA: Diagnosis not present

## 2020-06-01 DIAGNOSIS — R509 Fever, unspecified: Secondary | ICD-10-CM | POA: Diagnosis not present

## 2020-06-01 DIAGNOSIS — R051 Acute cough: Secondary | ICD-10-CM | POA: Diagnosis not present

## 2020-07-30 DIAGNOSIS — J45909 Unspecified asthma, uncomplicated: Secondary | ICD-10-CM | POA: Diagnosis not present

## 2020-07-30 DIAGNOSIS — R519 Headache, unspecified: Secondary | ICD-10-CM | POA: Diagnosis not present

## 2020-07-30 DIAGNOSIS — R42 Dizziness and giddiness: Secondary | ICD-10-CM | POA: Diagnosis not present

## 2020-07-30 DIAGNOSIS — J209 Acute bronchitis, unspecified: Secondary | ICD-10-CM | POA: Diagnosis not present

## 2020-07-30 DIAGNOSIS — R0981 Nasal congestion: Secondary | ICD-10-CM | POA: Diagnosis not present

## 2020-07-30 DIAGNOSIS — Z20828 Contact with and (suspected) exposure to other viral communicable diseases: Secondary | ICD-10-CM | POA: Diagnosis not present

## 2020-08-24 DIAGNOSIS — J0100 Acute maxillary sinusitis, unspecified: Secondary | ICD-10-CM | POA: Diagnosis not present

## 2020-09-27 DIAGNOSIS — S99921A Unspecified injury of right foot, initial encounter: Secondary | ICD-10-CM | POA: Diagnosis not present

## 2020-09-27 DIAGNOSIS — S8001XA Contusion of right knee, initial encounter: Secondary | ICD-10-CM | POA: Diagnosis not present

## 2020-09-27 DIAGNOSIS — S93401A Sprain of unspecified ligament of right ankle, initial encounter: Secondary | ICD-10-CM | POA: Diagnosis not present

## 2020-09-27 DIAGNOSIS — W19XXXA Unspecified fall, initial encounter: Secondary | ICD-10-CM | POA: Diagnosis not present

## 2020-12-05 ENCOUNTER — Ambulatory Visit (INDEPENDENT_AMBULATORY_CARE_PROVIDER_SITE_OTHER): Payer: 59

## 2020-12-05 ENCOUNTER — Ambulatory Visit (INDEPENDENT_AMBULATORY_CARE_PROVIDER_SITE_OTHER): Payer: 59 | Admitting: Internal Medicine

## 2020-12-05 ENCOUNTER — Other Ambulatory Visit: Payer: Self-pay

## 2020-12-05 ENCOUNTER — Encounter: Payer: Self-pay | Admitting: Internal Medicine

## 2020-12-05 VITALS — BP 144/90 | HR 97 | Temp 98.4°F | Resp 16 | Ht 66.0 in | Wt 282.0 lb

## 2020-12-05 DIAGNOSIS — Z1159 Encounter for screening for other viral diseases: Secondary | ICD-10-CM | POA: Diagnosis not present

## 2020-12-05 DIAGNOSIS — S9782XA Crushing injury of left foot, initial encounter: Secondary | ICD-10-CM

## 2020-12-05 DIAGNOSIS — Z0001 Encounter for general adult medical examination with abnormal findings: Secondary | ICD-10-CM | POA: Diagnosis not present

## 2020-12-05 DIAGNOSIS — Z0000 Encounter for general adult medical examination without abnormal findings: Secondary | ICD-10-CM | POA: Diagnosis not present

## 2020-12-05 DIAGNOSIS — I10 Essential (primary) hypertension: Secondary | ICD-10-CM | POA: Diagnosis not present

## 2020-12-05 DIAGNOSIS — Z6841 Body Mass Index (BMI) 40.0 and over, adult: Secondary | ICD-10-CM

## 2020-12-05 DIAGNOSIS — R0683 Snoring: Secondary | ICD-10-CM | POA: Diagnosis not present

## 2020-12-05 DIAGNOSIS — S62611A Displaced fracture of proximal phalanx of left index finger, initial encounter for closed fracture: Secondary | ICD-10-CM | POA: Diagnosis not present

## 2020-12-05 DIAGNOSIS — I119 Hypertensive heart disease without heart failure: Secondary | ICD-10-CM

## 2020-12-05 DIAGNOSIS — Z1231 Encounter for screening mammogram for malignant neoplasm of breast: Secondary | ICD-10-CM

## 2020-12-05 NOTE — Patient Instructions (Signed)

## 2020-12-05 NOTE — Progress Notes (Signed)
Subjective:  Patient ID: Megan Duran, female    DOB: Aug 08, 1980  Age: 40 y.o. MRN: [id number]  CC: New Patient (Initial Visit), Annual Exam, and Hypertension  This visit occurred during the SARS-CoV-2 public health emergency.  Safety protocols were in place, including screening questions prior to the visit, additional usage of staff PPE, and extensive cleaning of exam room while observing appropriate contact time as indicated for disinfecting solutions.    HPI Megan Duran presents for a CPX, f/up, and to establish.  She has a history of hypertension but is not currently taking an antihypertensive.  She is active and denies any recent episodes of chest pain, shortness of breath, diaphoresis, dizziness, lightheadedness, or edema.  She complains of heavy snoring and weight gain.  She has a history of iron deficiency anemia.  She is taking a multivitamin with iron.  She injured her left foot 4 days ago.  She complains of pains, swelling, and bruising.  She denies paresthesias.  She is getting adequate symptom relief with over-the-counter remedies.  History Jatia has a past medical history of Asthma, Gastroparesis, GERD (gastroesophageal reflux disease), History of MRSA infection, IBS (irritable bowel syndrome), and Migraine.   She has a past surgical history that includes Gallbladder surgery (2007).   Her family history includes AVM in her mother; Anxiety disorder in her mother; Atrial fibrillation in her mother; Breast cancer in her mother; Congestive Heart Failure in her mother; Dementia in her maternal grandmother; Depression in her mother and sister; Diabetes in her father, mother, and sister; Fibromyalgia in her mother; GER disease in her sister; Heart Problems in her mother; Heart attack in her father, mother, and paternal grandmother; High Cholesterol in her father and sister; Hypertension in her father, maternal grandmother, mother, and sister; Hyperthyroidism in her maternal  grandmother; Migraines in her sister; Obesity in her father; Pancreatic cancer in her maternal aunt and maternal uncle; Pancreatitis in her sister; Stroke in her mother; Throat cancer in her maternal grandfather.She reports that she has never smoked. She has never used smokeless tobacco. She reports that she does not drink alcohol and does not use drugs.  Outpatient Medications Prior to Visit  Medication Sig Dispense Refill   cetirizine (ZYRTEC) 10 MG tablet Can take one tablet by mouth once daily if needed. 90 tablet 1   diclofenac sodium (VOLTAREN) 1 % GEL Apply 4 g topically 4 (four) times daily. 150 g 1   fluticasone (FLONASE) 50 MCG/ACT nasal spray Use one to two sprays in each nostril once daily as directed. 48 g 1   levalbuterol (XOPENEX HFA) 45 MCG/ACT inhaler Can use two puffs every four to six hours as needed for cough or wheeze. 3 Inhaler 0   levonorgestrel (MIRENA, 52 MG,) 20 MCG/24HR IUD Mirena 20 mcg/24 hours (6 yrs) 52 mg intrauterine device  Take 1 device by intrauterine route.     magnesium 30 MG tablet Take 30 mg by mouth daily.     meclizine (ANTIVERT) 12.5 MG tablet Take one tablet every 6 hours if needed for vertigo. 45 tablet 1   Multiple Vitamin (MULTIVITAMIN PO) Take by mouth.     omeprazole (PRILOSEC) 20 MG capsule TAKE 1 CAPSULE (20 MG TOTAL) BY MOUTH DAILY. 90 capsule 1   VITAMIN D PO Take by mouth daily.     SYMBICORT 160-4.5 MCG/ACT inhaler Inhale two puffs twice daily to prevent cough or wheeze.  Rinse, gargle, and spit after use. (Patient not taking: Reported on 12/05/2020)  3 Inhaler 1   phenazopyridine (PYRIDIUM) 200 MG tablet TAKE 1 TABLET EVERY 8 HOURS AS NEEDED FOR DYSURIA     predniSONE (STERAPRED UNI-PAK 21 TAB) 10 MG (21) TBPK tablet Take as directed 21 tablet 0   No facility-administered medications prior to visit.    ROS Review of Systems  Constitutional:  Positive for unexpected weight change. Negative for appetite change, diaphoresis and fatigue.   HENT: Negative.    Eyes: Negative.   Respiratory:  Negative for apnea, cough, chest tightness, shortness of breath and wheezing.   Cardiovascular:  Negative for chest pain, palpitations and leg swelling.  Gastrointestinal:  Negative for abdominal pain, constipation, diarrhea, nausea and vomiting.  Endocrine: Negative.   Genitourinary: Negative.  Negative for decreased urine volume, difficulty urinating, dysuria, hematuria and urgency.  Musculoskeletal:  Positive for arthralgias.  Skin:  Positive for color change. Negative for wound.  Neurological: Negative.  Negative for dizziness, weakness, light-headedness, numbness and headaches.  Hematological:  Negative for adenopathy. Does not bruise/bleed easily.  Psychiatric/Behavioral: Negative.     Objective:  BP (!) 144/90 (BP Location: Right Arm, Patient Position: Sitting, Cuff Size: Large)   Pulse 97   Temp 98.4 F (36.9 C) (Oral)   Resp 16   Ht 5\' 6"  (1.676 m)   Wt 282 lb (127.9 kg)   SpO2 98%   BMI 45.52 kg/m   Physical Exam Vitals reviewed.  Constitutional:      Appearance: She is obese.  HENT:     Nose: Nose normal.     Mouth/Throat:     Mouth: Mucous membranes are moist.  Eyes:     General: No scleral icterus.    Conjunctiva/sclera: Conjunctivae normal.  Cardiovascular:     Rate and Rhythm: Normal rate and regular rhythm.     Pulses:          Dorsalis pedis pulses are 1+ on the right side and 1+ on the left side.       Posterior tibial pulses are 1+ on the right side and 1+ on the left side.     Heart sounds: No murmur heard.    Comments: EKG- NSR, 88 bpm ++ LVH in I and II Otherwsie normal EKG Pulmonary:     Effort: Pulmonary effort is normal.     Breath sounds: No stridor. No wheezing, rhonchi or rales.  Abdominal:     General: Abdomen is protuberant. Bowel sounds are normal. There is no distension.     Palpations: Abdomen is soft. There is no hepatomegaly, splenomegaly or mass.  Musculoskeletal:         General: Normal range of motion.     Cervical back: Neck supple.     Right lower leg: No edema.     Left lower leg: No edema.     Left foot: Deformity present.  Feet:     Comments: Ecchymosis, swelling, tenderness to palpation over the second digit extending onto the proximal foot.  No wounds or open fractures. Lymphadenopathy:     Cervical: No cervical adenopathy.  Skin:    General: Skin is warm and dry.     Findings: No rash.  Neurological:     General: No focal deficit present.     Mental Status: She is alert.  Psychiatric:        Mood and Affect: Mood normal.        Behavior: Behavior normal.    Lab Results  Component Value Date   WBC 9.5 12/05/2020  HGB 13.0 12/05/2020   HCT 39.1 12/05/2020   PLT 260.0 12/05/2020   GLUCOSE 85 12/05/2020   CHOL 232 (H) 12/05/2020   TRIG 132.0 12/05/2020   HDL 43.60 12/05/2020   LDLCALC 162 (H) 12/05/2020   ALT 25 12/05/2020   AST 18 12/05/2020   NA 137 12/05/2020   K 4.0 12/05/2020   CL 101 12/05/2020   CREATININE 0.73 12/05/2020   BUN 15 12/05/2020   CO2 29 12/05/2020   TSH 0.78 12/05/2020   HGBA1C 5.7 12/05/2020     Assessment & Plan:   Tieara was seen today for new patient (initial visit), annual exam and hypertension.  Diagnoses and all orders for this visit:  Primary hypertension- She has developed stage I hypertension.  Labs are negative for secondary causes or endorgan damage.  In addition to lifestyle modifications I recommended that she start taking an ARB. -     EKG 12-Lead -     CBC with Differential/Platelet; Future -     Basic metabolic panel; Future -     Aldosterone + renin activity w/ ratio; Future -     Urinalysis, Routine w reflex microscopic; Future -     VITAMIN D 25 Hydroxy (Vit-D Deficiency, Fractures); Future -     Thyroid Panel With TSH; Future -     Thyroid Panel With TSH -     VITAMIN D 25 Hydroxy (Vit-D Deficiency, Fractures) -     Urinalysis, Routine w reflex microscopic -     Aldosterone  + renin activity w/ ratio -     Basic metabolic panel -     CBC with Differential/Platelet -     olmesartan (BENICAR) 20 MG tablet; Take 1 tablet (20 mg total) by mouth daily.  Morbid obesity with BMI of 40.0-44.9, adult (HCC)- Labs are negative for secondary causes or complications.  She is motivated to improving her lifestyle modifications. -     VITAMIN D 25 Hydroxy (Vit-D Deficiency, Fractures); Future -     Hepatic function panel; Future -     Hemoglobin A1c; Future -     Thyroid Panel With TSH; Future -     Thyroid Panel With TSH -     Hemoglobin A1c -     Hepatic function panel -     VITAMIN D 25 Hydroxy (Vit-D Deficiency, Fractures)  Crush injury of left foot, initial encounter- She has a fracture in the second toe.  She will rest, ice, and immobilize. -     DG Foot Complete Left; Future  Encounter for general adult medical examination with abnormal findings- Exam completed, labs reviewed, will collect her vaccine history, cancer screenings addressed, patient education was given. -     Lipid panel; Future -     Hepatitis C antibody; Future -     Hepatitis C antibody -     Lipid panel  Need for hepatitis C screening test -     Hepatitis C antibody; Future -     Hepatitis C antibody  LVH (left ventricular hypertrophy) due to hypertensive disease, without heart failure -     olmesartan (BENICAR) 20 MG tablet; Take 1 tablet (20 mg total) by mouth daily.  Loud snoring -     Ambulatory referral to Sleep Studies  Visit for screening mammogram -     MM DIGITAL SCREENING BILATERAL; Future  I have discontinued Jetaime L. Bosak's phenazopyridine and predniSONE. I am also having her start on olmesartan. Additionally, I am having  her maintain her VITAMIN D PO, levalbuterol, fluticasone, cetirizine, Symbicort, Multiple Vitamin (MULTIVITAMIN PO), meclizine, diclofenac sodium, omeprazole, Mirena (52 MG), and magnesium.  Meds ordered this encounter  Medications   olmesartan  (BENICAR) 20 MG tablet    Sig: Take 1 tablet (20 mg total) by mouth daily.    Dispense:  90 tablet    Refill:  1      Follow-up: Return in about 3 months (around 03/07/2021).  Sanda Lingerhomas Courtnei Ruddell, MD

## 2020-12-06 LAB — BASIC METABOLIC PANEL
BUN: 15 mg/dL (ref 6–23)
CO2: 29 mEq/L (ref 19–32)
Calcium: 9.4 mg/dL (ref 8.4–10.5)
Chloride: 101 mEq/L (ref 96–112)
Creatinine, Ser: 0.73 mg/dL (ref 0.40–1.20)
GFR: 102.17 mL/min (ref >60.00)
Glucose, Bld: 85 mg/dL (ref 70–99)
Potassium: 4 mEq/L (ref 3.5–5.1)
Sodium: 137 mEq/L (ref 135–145)

## 2020-12-06 LAB — CBC WITH DIFFERENTIAL/PLATELET
Basophils Absolute: 0 10*3/uL (ref 0.0–0.1)
Basophils Relative: 0.4 % (ref 0.0–3.0)
Eosinophils Absolute: 0.1 10*3/uL (ref 0.0–0.7)
Eosinophils Relative: 0.9 % (ref 0.0–5.0)
HCT: 39.1 % (ref 36.0–46.0)
Hemoglobin: 13 g/dL (ref 12.0–15.0)
Lymphocytes Relative: 17.1 % (ref 12.0–46.0)
Lymphs Abs: 1.6 10*3/uL (ref 0.7–4.0)
MCHC: 33.3 g/dL (ref 30.0–36.0)
MCV: 92.7 fl (ref 78.0–100.0)
Monocytes Absolute: 0.5 10*3/uL (ref 0.1–1.0)
Monocytes Relative: 5.3 % (ref 3.0–12.0)
Neutro Abs: 7.3 10*3/uL (ref 1.4–7.7)
Neutrophils Relative %: 76.3 % (ref 43.0–77.0)
Platelets: 260 10*3/uL (ref 150.0–400.0)
RBC: 4.22 Mil/uL (ref 3.87–5.11)
RDW: 13.2 % (ref 11.5–15.5)
WBC: 9.5 10*3/uL (ref 4.0–10.5)

## 2020-12-06 LAB — URINALYSIS, ROUTINE W REFLEX MICROSCOPIC
Bilirubin Urine: NEGATIVE
Ketones, ur: NEGATIVE
Leukocytes,Ua: NEGATIVE
Nitrite: NEGATIVE
Specific Gravity, Urine: 1.01 (ref 1.000–1.030)
Total Protein, Urine: NEGATIVE
Urine Glucose: NEGATIVE
Urobilinogen, UA: 0.2 (ref 0.0–1.0)
pH: 6.5 (ref 5.0–8.0)

## 2020-12-06 LAB — VITAMIN D 25 HYDROXY (VIT D DEFICIENCY, FRACTURES): VITD: 36.55 ng/mL (ref 30.00–100.00)

## 2020-12-06 LAB — HEPATIC FUNCTION PANEL
ALT: 25 U/L (ref 0–35)
AST: 18 U/L (ref 0–37)
Albumin: 4.1 g/dL (ref 3.5–5.2)
Alkaline Phosphatase: 65 U/L (ref 39–117)
Bilirubin, Direct: 0.1 mg/dL (ref 0.0–0.3)
Total Bilirubin: 0.2 mg/dL (ref 0.2–1.2)
Total Protein: 7.5 g/dL (ref 6.0–8.3)

## 2020-12-06 LAB — LIPID PANEL
Cholesterol: 232 mg/dL — ABNORMAL HIGH (ref 0–200)
HDL: 43.6 mg/dL (ref >39.00)
LDL Cholesterol: 162 mg/dL — ABNORMAL HIGH (ref 0–99)
NonHDL: 188.87
Total CHOL/HDL Ratio: 5
Triglycerides: 132 mg/dL (ref 0.0–149.0)
VLDL: 26.4 mg/dL (ref 0.0–40.0)

## 2020-12-06 LAB — HEMOGLOBIN A1C: Hgb A1c MFr Bld: 5.7 % (ref 4.6–6.5)

## 2020-12-07 ENCOUNTER — Other Ambulatory Visit (HOSPITAL_COMMUNITY): Payer: Self-pay

## 2020-12-07 MED ORDER — OLMESARTAN MEDOXOMIL 20 MG PO TABS
20.0000 mg | ORAL_TABLET | Freq: Every day | ORAL | 1 refills | Status: DC
  Filled 2020-12-07: qty 90, 90d supply, fill #0
  Filled 2021-03-12: qty 90, 90d supply, fill #1

## 2020-12-14 LAB — THYROID PANEL WITH TSH
Free Thyroxine Index: 2.3 (ref 1.4–3.8)
T3 Uptake: 31 % (ref 22–35)
T4, Total: 7.5 ug/dL (ref 5.1–11.9)
TSH: 0.78 mIU/L

## 2020-12-14 LAB — HEPATITIS C ANTIBODY
Hepatitis C Ab: NONREACTIVE
SIGNAL TO CUT-OFF: 0.01 (ref <1.00)

## 2020-12-14 LAB — ALDOSTERONE + RENIN ACTIVITY W/ RATIO
Aldosterone: <1 ng/dL
Renin Activity: 0.68 ng/mL/h (ref 0.25–5.82)

## 2021-01-11 DIAGNOSIS — R051 Acute cough: Secondary | ICD-10-CM | POA: Diagnosis not present

## 2021-01-11 DIAGNOSIS — R509 Fever, unspecified: Secondary | ICD-10-CM | POA: Diagnosis not present

## 2021-01-11 DIAGNOSIS — J349 Unspecified disorder of nose and nasal sinuses: Secondary | ICD-10-CM | POA: Diagnosis not present

## 2021-01-11 DIAGNOSIS — Z20828 Contact with and (suspected) exposure to other viral communicable diseases: Secondary | ICD-10-CM | POA: Diagnosis not present

## 2021-01-29 ENCOUNTER — Ambulatory Visit
Admission: RE | Admit: 2021-01-29 | Discharge: 2021-01-29 | Disposition: A | Discharge status: Home / Self Care | Payer: 59 | Source: Ambulatory Visit | Attending: Internal Medicine | Admitting: Internal Medicine

## 2021-01-29 ENCOUNTER — Other Ambulatory Visit: Payer: Self-pay

## 2021-01-29 DIAGNOSIS — Z1231 Encounter for screening mammogram for malignant neoplasm of breast: Secondary | ICD-10-CM | POA: Diagnosis not present

## 2021-02-02 ENCOUNTER — Other Ambulatory Visit: Payer: Self-pay | Admitting: Internal Medicine

## 2021-02-02 DIAGNOSIS — R928 Other abnormal and inconclusive findings on diagnostic imaging of breast: Secondary | ICD-10-CM

## 2021-02-08 DIAGNOSIS — J0100 Acute maxillary sinusitis, unspecified: Secondary | ICD-10-CM | POA: Diagnosis not present

## 2021-02-08 DIAGNOSIS — R110 Nausea: Secondary | ICD-10-CM | POA: Diagnosis not present

## 2021-02-08 DIAGNOSIS — R0981 Nasal congestion: Secondary | ICD-10-CM | POA: Diagnosis not present

## 2021-02-08 DIAGNOSIS — Z20828 Contact with and (suspected) exposure to other viral communicable diseases: Secondary | ICD-10-CM | POA: Diagnosis not present

## 2021-02-08 DIAGNOSIS — R519 Headache, unspecified: Secondary | ICD-10-CM | POA: Diagnosis not present

## 2021-02-20 ENCOUNTER — Ambulatory Visit
Admission: RE | Admit: 2021-02-20 | Discharge: 2021-02-20 | Disposition: A | Discharge status: Home / Self Care | Payer: 59 | Source: Ambulatory Visit | Attending: Internal Medicine | Admitting: Internal Medicine

## 2021-02-20 ENCOUNTER — Other Ambulatory Visit: Payer: Self-pay

## 2021-02-20 DIAGNOSIS — R928 Other abnormal and inconclusive findings on diagnostic imaging of breast: Secondary | ICD-10-CM | POA: Diagnosis not present

## 2021-02-20 DIAGNOSIS — N6012 Diffuse cystic mastopathy of left breast: Secondary | ICD-10-CM | POA: Diagnosis not present

## 2021-03-05 DIAGNOSIS — R051 Acute cough: Secondary | ICD-10-CM | POA: Diagnosis not present

## 2021-03-05 DIAGNOSIS — R0981 Nasal congestion: Secondary | ICD-10-CM | POA: Diagnosis not present

## 2021-03-05 DIAGNOSIS — Z20828 Contact with and (suspected) exposure to other viral communicable diseases: Secondary | ICD-10-CM | POA: Diagnosis not present

## 2021-03-05 DIAGNOSIS — J019 Acute sinusitis, unspecified: Secondary | ICD-10-CM | POA: Diagnosis not present

## 2021-03-13 ENCOUNTER — Other Ambulatory Visit (HOSPITAL_COMMUNITY): Payer: Self-pay

## 2021-03-15 ENCOUNTER — Encounter: Payer: Self-pay | Admitting: Allergy and Immunology

## 2021-03-15 ENCOUNTER — Other Ambulatory Visit (HOSPITAL_COMMUNITY): Payer: Self-pay

## 2021-03-15 ENCOUNTER — Ambulatory Visit: Payer: 59 | Admitting: Allergy and Immunology

## 2021-03-15 ENCOUNTER — Other Ambulatory Visit: Payer: Self-pay

## 2021-03-15 VITALS — BP 150/86 | HR 103 | Resp 16

## 2021-03-15 DIAGNOSIS — J4540 Moderate persistent asthma, uncomplicated: Secondary | ICD-10-CM | POA: Diagnosis not present

## 2021-03-15 DIAGNOSIS — J3089 Other allergic rhinitis: Secondary | ICD-10-CM

## 2021-03-15 DIAGNOSIS — H6983 Other specified disorders of Eustachian tube, bilateral: Secondary | ICD-10-CM

## 2021-03-15 DIAGNOSIS — K219 Gastro-esophageal reflux disease without esophagitis: Secondary | ICD-10-CM

## 2021-03-15 DIAGNOSIS — H6993 Unspecified Eustachian tube disorder, bilateral: Secondary | ICD-10-CM

## 2021-03-15 MED ORDER — MECLIZINE HCL 12.5 MG PO TABS
ORAL_TABLET | ORAL | 1 refills | Status: DC
  Filled 2021-03-15: qty 45, 12d supply, fill #0
  Filled 2021-04-16: qty 45, 12d supply, fill #1

## 2021-03-15 MED ORDER — OMEPRAZOLE 40 MG PO CPDR
DELAYED_RELEASE_CAPSULE | ORAL | 5 refills | Status: DC
  Filled 2021-03-15: qty 60, 30d supply, fill #0
  Filled 2021-04-16: qty 60, 30d supply, fill #1
  Filled 2021-05-11: qty 60, 30d supply, fill #2
  Filled 2021-06-13: qty 60, 30d supply, fill #3
  Filled 2021-07-19: qty 60, 30d supply, fill #4
  Filled 2021-08-31: qty 60, 30d supply, fill #5

## 2021-03-15 MED ORDER — LEVALBUTEROL TARTRATE 45 MCG/ACT IN AERO
INHALATION_SPRAY | RESPIRATORY_TRACT | 2 refills | Status: DC
  Filled 2021-03-15: qty 15, 16d supply, fill #0
  Filled 2021-04-16: qty 15, 16d supply, fill #1
  Filled 2021-11-28: qty 15, 16d supply, fill #2

## 2021-03-15 MED ORDER — BREZTRI AEROSPHERE 160-9-4.8 MCG/ACT IN AERO
2.0000 | INHALATION_SPRAY | Freq: Two times a day (BID) | RESPIRATORY_TRACT | 5 refills | Status: DC
Start: 2021-03-15 — End: 2021-10-15
  Filled 2021-03-15: qty 10.7, 30d supply, fill #0
  Filled 2021-04-16: qty 10.7, 30d supply, fill #1
  Filled 2021-05-11: qty 10.7, 30d supply, fill #2
  Filled 2021-06-13: qty 10.7, 30d supply, fill #3
  Filled 2021-07-19: qty 10.7, 30d supply, fill #4
  Filled 2021-08-31: qty 10.7, 30d supply, fill #5

## 2021-03-15 NOTE — Progress Notes (Signed)
[place] [facility] [place] [facility] [place] [facility] [place] [facility] [place]   Follow-up Note  Referring Provider: Etta Grandchild, MD Primary Provider: Etta Grandchild, MD Date of Office Visit: 03/15/2021  Subjective:   Megan Duran (DOB: 03/11/1981) is a 40 y.o. female who [facility] in re-evaluation of the following:  HPI: Megan Duran returns to this clinic in reevaluation of asthma, allergic rhinitis, and reflux induced respiratory disease.  I have not seen her in this clinic since 12 November 2018.  Apparently she was doing very well regarding her airway without the need for any medication until September 2022.  At that point in time she developed "sinus" and went to the urgent care center and was treated with antibiotic but it never really resolved and then she ended up in the urgent care center once again recently and was treated with an antibiotic and a systemic steroid.  She was initially treated with azithromycin and she just finished her course of Augmentin this past weekend.  She has also restarted her Symbicort and nasal saline.  She does not really use albuterol.  She complains of having head congestion and nasal congestion and some anosmia and some decreased the ability to hear very well and some ear fullness and some vertigo and some sore throat and some occasional yellow nasal discharge.  She has had fevers up to 99.5 but nothing exceeding 99.5.  Her reflux appears to be under good control at this point in time.   Allergies as of 03/15/2021       Reactions   Bactrim [sulfamethoxazole-trimethoprim] Hives   Biaxin [clarithromycin] Hives   Doxycycline Hives   Effexor [venlafaxine] Hives   Erythromycin Hives   Imitrex [sumatriptan] Hives   Keflex [cephalexin] Hives   Minocycline Hives        Medication List    Breztri Aerosphere 160-9-4.8 MCG/ACT Aero Generic drug: Budeson-Glycopyrrol-Formoterol Inhale 2 puffs into the lungs in the  morning and at bedtime. Started by: Megan Priest, MD   cetirizine 10 MG tablet Commonly known as: ZYRTEC Can take one tablet by mouth once daily if needed.   diclofenac sodium 1 % Gel Commonly known as: VOLTAREN Apply 4 g topically 4 (four) times daily.   fluticasone 50 MCG/ACT nasal spray Commonly known as: Flonase Use one to two sprays in each nostril once daily as directed.   levalbuterol 45 MCG/ACT inhaler Commonly known as: Xopenex HFA Inhale two puffs into the lungs every four to six hours as needed for cough or wheeze.   magnesium 30 MG tablet Take 30 mg by mouth daily.   meclizine 12.5 MG tablet Commonly known as: ANTIVERT Take 1/2- 1 tablet by mouth every 6 hours if needed for vertigo.   Mirena (52 MG) 20 MCG/DAY Iud Generic drug: levonorgestrel Mirena 20 mcg/24 hours (6 yrs) 52 mg intrauterine device  Take 1 device by intrauterine route.   MULTIVITAMIN PO Take by mouth.   olmesartan 20 MG tablet Commonly known as: BENICAR Take 1 tablet (20 mg total) by mouth daily.   omeprazole 40 MG capsule Commonly known as: PRILOSEC Take 1 capsule by mouth 2 times per day   VITAMIN D PO Take by mouth daily.    Past Medical History:  Diagnosis Date   Asthma    only uses maintanence inhaler   Gastroparesis    GERD (gastroesophageal reflux disease)    takes meds   History of MRSA infection  IBS (irritable bowel syndrome)    Migraine     Past Surgical History:  Procedure Laterality Date   GALLBLADDER SURGERY  2007    Review of systems negative except as noted in HPI / PMHx or noted below:  Review of Systems  Constitutional: Negative.   HENT: Negative.    Eyes: Negative.   Respiratory: Negative.    Cardiovascular: Negative.   Gastrointestinal: Negative.   Genitourinary: Negative.   Musculoskeletal: Negative.   Skin: Negative.   Neurological: Negative.   Endo/Heme/Allergies: Negative.   Psychiatric/Behavioral: Negative.      Objective:    Vitals:   03/15/21 1150  BP: (!) 150/86  Pulse: (!) 103  Resp: 16  SpO2: 98%          Physical Exam Constitutional:      Appearance: She is not diaphoretic.     Comments: Nasal voice  HENT:     Head: Normocephalic.     Right Ear: Tympanic membrane, ear canal and external ear normal.     Left Ear: Tympanic membrane, ear canal and external ear normal.     Nose: Mucosal edema (Erythematous) and rhinorrhea (Clear) present.     Mouth/Throat:     Pharynx: Uvula midline. No oropharyngeal exudate.  Eyes:     Conjunctiva/sclera: Conjunctivae normal.  Neck:     Thyroid: No thyromegaly.     Trachea: Trachea normal. No tracheal tenderness or tracheal deviation.  Cardiovascular:     Rate and Rhythm: Normal rate and regular rhythm.     Heart sounds: Normal heart sounds, S1 normal and S2 normal. No murmur heard. Pulmonary:     Effort: No respiratory distress.     Breath sounds: Normal breath sounds. No stridor. No wheezing or rales.  Lymphadenopathy:     Head:     Right side of head: No tonsillar adenopathy.     Left side of head: No tonsillar adenopathy.     Cervical: No cervical adenopathy.  Skin:    Findings: No erythema or rash.     Nails: There is no clubbing.  Neurological:     Mental Status: She is alert.    Diagnostics:    Spirometry was performed and demonstrated an FEV1 of 2.40 at 78 % of predicted.  Assessment and Plan:   1. Not well controlled moderate persistent asthma   2. Perennial allergic rhinitis   3. Dysfunction of both eustachian tubes   4. LPRD (laryngopharyngeal reflux disease)     1. Breztri - 2 inhalations 2 times per day (empty lungs)  2. Ryaltris - 2 sprays each nostril 2 times per day (specialty pharmacy)  3. Omeprazole 40 mg - 1 tablet 2 times per day  4. Mucinex DM - 1-2 tablets 2 times per day  5. Cetrizine 10 mg - 1 tablet 2 times per day  6. If needed:  A. Nasal saline a few times per day B. Xopenex - 2 inhalations 2 times per  day C. Positive pressure inflation of ears D. Meclizine 12.5 mg - 1/2 - 1 tablet every 6 hours if needed  7. Return to clinic in 2 weeks or earlier if problem.  8. Imaging of airway???  Megan Duran appears to have developed a viral respiratory tract infection and may actually have developed multiple viral respiratory tract infections giving rise to significant inflammation of her airway.  At this point I am not going to treat her with any antibiotics for bacterial infection and we will just focus on the  inflammatory component of this condition with the therapy noted above.  As well, given the fact that she does have reflux disease we will also make sure that this is adequately treated to address any component of reflux induced respiratory disease.  I am going to see her back in this clinic in 2 weeks and make a decision about imaging her airway pending her response to the therapy noted above.  Laurette Schimke, MD Allergy / Immunology [facility]

## 2021-03-15 NOTE — Patient Instructions (Addendum)
  1. Breztri - 2 inhalations 2 times per day (empty lungs)  2. Ryaltris - 2 sprays each nostril 2 times per day (specialty pharmacy)  3. Omeprazole 40 mg - 1 tablet 2 times per day  4. Mucinex DM - 1-2 tablets 2 times per day  5. Cetrizine 10 mg - 1 tablet 2 times per day  6. If needed:  A. Nasal saline a few times per day B. Xopenex - 2 inhalations 2 times per day C. Positive pressure inflation of ears D. Meclizine 12.5 mg - 1/2 - 1 tablet every 6 hours if needed  7. Return to clinic in 2 weeks or earlier if problem.  8. Imaging of airway???

## 2021-03-16 ENCOUNTER — Other Ambulatory Visit (HOSPITAL_COMMUNITY): Payer: Self-pay

## 2021-03-19 ENCOUNTER — Encounter: Payer: Self-pay | Admitting: Allergy and Immunology

## 2021-03-21 ENCOUNTER — Other Ambulatory Visit: Payer: Self-pay

## 2021-03-21 ENCOUNTER — Telehealth: Payer: Self-pay | Admitting: Allergy and Immunology

## 2021-03-21 MED ORDER — RYALTRIS 665-25 MCG/ACT NA SUSP
2.0000 | Freq: Two times a day (BID) | NASAL | 5 refills | Status: DC
Start: 2021-03-21 — End: 2021-10-11

## 2021-03-21 NOTE — Telephone Encounter (Signed)
Prescription has been sent to specialty pharmacy. I informed patient I will place another sample at the front for her to pick up until she gets her from the pharmacy.   She states she still has sinus pressure and blowing out yellow/green mucus. She would like to know if there is anything else she can take or if an antibiotic is needed.

## 2021-03-21 NOTE — Telephone Encounter (Signed)
Lets get a sinus ct scan

## 2021-03-21 NOTE — Telephone Encounter (Signed)
Patient states the specialty pharmacy has not contacted her regarding the Ryaltris. She was told they would be in contact with her but she's not sure if she needs to do something on her end. Patient is still blowing out yellow snot and feeling miserable. She did mention with the new regimen, things have slowly started getting a little better. She's wondering if she needs an antibiotic.

## 2021-03-22 ENCOUNTER — Other Ambulatory Visit: Payer: Self-pay

## 2021-03-22 DIAGNOSIS — J329 Chronic sinusitis, unspecified: Secondary | ICD-10-CM

## 2021-03-22 NOTE — Telephone Encounter (Signed)
Working on getting CT scheduled for patient. Patient is aware

## 2021-03-22 NOTE — Telephone Encounter (Signed)
Order for CT scan has been placed. [facility] will be calling patient to set up appointment.

## 2021-03-30 ENCOUNTER — Ambulatory Visit (HOSPITAL_BASED_OUTPATIENT_CLINIC_OR_DEPARTMENT_OTHER)
Admission: RE | Admit: 2021-03-30 | Discharge: 2021-03-30 | Disposition: A | Discharge status: Home / Self Care | Payer: 59 | Source: Ambulatory Visit | Attending: Allergy and Immunology | Admitting: Allergy and Immunology

## 2021-03-30 ENCOUNTER — Other Ambulatory Visit: Payer: Self-pay

## 2021-03-30 DIAGNOSIS — J3489 Other specified disorders of nose and nasal sinuses: Secondary | ICD-10-CM | POA: Diagnosis not present

## 2021-03-30 DIAGNOSIS — J329 Chronic sinusitis, unspecified: Secondary | ICD-10-CM | POA: Insufficient documentation

## 2021-03-30 DIAGNOSIS — R0689 Other abnormalities of breathing: Secondary | ICD-10-CM | POA: Diagnosis not present

## 2021-03-30 DIAGNOSIS — J0100 Acute maxillary sinusitis, unspecified: Secondary | ICD-10-CM | POA: Diagnosis not present

## 2021-03-30 DIAGNOSIS — J342 Deviated nasal septum: Secondary | ICD-10-CM | POA: Diagnosis not present

## 2021-04-02 ENCOUNTER — Other Ambulatory Visit: Payer: Self-pay

## 2021-04-02 ENCOUNTER — Ambulatory Visit: Payer: 59 | Admitting: Allergy and Immunology

## 2021-04-02 VITALS — BP 144/98 | HR 115 | Resp 20

## 2021-04-02 DIAGNOSIS — J329 Chronic sinusitis, unspecified: Secondary | ICD-10-CM

## 2021-04-02 MED ORDER — CLINDAMYCIN HCL 150 MG PO CAPS: 150.0000 mg | ORAL_CAPSULE | Freq: Three times a day (TID) | ORAL | 0 refills | Status: AC

## 2021-04-02 MED ORDER — ONDANSETRON HCL 4 MG PO TABS: ORAL_TABLET | ORAL | 1 refills | Status: DC

## 2021-04-02 NOTE — Progress Notes (Signed)
Megan Duran returns to this clinic in evaluation of asthma and allergic rhinitis and reflux induced respiratory disease and ETD and chronic sinusitis.  Because she was not doing well with a collection of anti-inflammatory agents for airway and several courses of antibiotics for sinusitis we obtained a CT scan of her sinuses on 01 April 2021 which identified fluid levels in the left maxillary sinus with superimposed mucosal thickening and opacified left ostiomeatal complex due to mucosal thickening.  She will start a course of clindamycin for the next 20 days and we will refer her onto ENT for further evaluation of her chronic sinusitis and her ETD.  She still continues to have vertigo and she is asking for some Zofran to supplement her meclizine as she is getting nauseated with this issue.

## 2021-04-02 NOTE — Patient Instructions (Addendum)
  1.  Continue to treat and prevent inflammation:   A. Breztri - 2 inhalations 2 times per day (empty lungs) B. 2. Ryaltris - 2 sprays each nostril 2 times per day (specialty pharmacy)  2. Continue to treat and prevent reflux:  A. Omeprazole 40 mg - 1 tablet 2 times per day  3. Treat chronic sinus infection and ETD:  A. Clindamycin 150 - 1 tablet 3 times per day for 20 days B. Visit with ENT    4. If needed:  A. Nasal saline a few times per day B. Xopenex - 2 inhalations 2 times per day C. Positive pressure inflation of ears D. Meclizine 12.5 mg - 1-2 tablets every 6 hours if needed E. Mucinex DM - 1-2 tablets 2 times per day F. Cetrizine 10 mg - 1 tablet 2 times per day G Zofran 4 mg - 1-2 tablets every 8 hours for nausea  5. Return to clinic in Christmas week or earlier if problem.

## 2021-04-03 ENCOUNTER — Encounter: Payer: Self-pay | Admitting: Allergy and Immunology

## 2021-04-16 ENCOUNTER — Other Ambulatory Visit (HOSPITAL_COMMUNITY): Payer: Self-pay

## 2021-04-17 ENCOUNTER — Other Ambulatory Visit (HOSPITAL_COMMUNITY): Payer: Self-pay

## 2021-04-17 DIAGNOSIS — J343 Hypertrophy of nasal turbinates: Secondary | ICD-10-CM | POA: Insufficient documentation

## 2021-04-17 DIAGNOSIS — J342 Deviated nasal septum: Secondary | ICD-10-CM | POA: Diagnosis not present

## 2021-04-17 DIAGNOSIS — J324 Chronic pansinusitis: Secondary | ICD-10-CM | POA: Diagnosis not present

## 2021-04-17 DIAGNOSIS — J310 Chronic rhinitis: Secondary | ICD-10-CM | POA: Diagnosis not present

## 2021-04-24 DIAGNOSIS — U071 COVID-19: Secondary | ICD-10-CM | POA: Insufficient documentation

## 2021-05-01 ENCOUNTER — Encounter: Payer: Self-pay | Admitting: Internal Medicine

## 2021-05-01 NOTE — Telephone Encounter (Signed)
Pt has been called and scheduled for 12/2 @ 9.40am with Dr. Oliver Barre. PCP full.

## 2021-05-03 ENCOUNTER — Ambulatory Visit: Payer: 59 | Admitting: Internal Medicine

## 2021-05-03 ENCOUNTER — Other Ambulatory Visit (HOSPITAL_COMMUNITY): Payer: Self-pay

## 2021-05-03 ENCOUNTER — Ambulatory Visit (INDEPENDENT_AMBULATORY_CARE_PROVIDER_SITE_OTHER): Payer: 59

## 2021-05-03 ENCOUNTER — Other Ambulatory Visit: Payer: Self-pay

## 2021-05-03 ENCOUNTER — Encounter: Payer: Self-pay | Admitting: Internal Medicine

## 2021-05-03 VITALS — BP 139/103 | HR 129 | Temp 97.9°F | Ht 66.0 in | Wt 299.0 lb

## 2021-05-03 DIAGNOSIS — J329 Chronic sinusitis, unspecified: Secondary | ICD-10-CM | POA: Diagnosis not present

## 2021-05-03 DIAGNOSIS — R059 Cough, unspecified: Secondary | ICD-10-CM | POA: Diagnosis not present

## 2021-05-03 DIAGNOSIS — R051 Acute cough: Secondary | ICD-10-CM

## 2021-05-03 DIAGNOSIS — R062 Wheezing: Secondary | ICD-10-CM

## 2021-05-03 DIAGNOSIS — R0602 Shortness of breath: Secondary | ICD-10-CM | POA: Diagnosis not present

## 2021-05-03 DIAGNOSIS — I10 Essential (primary) hypertension: Secondary | ICD-10-CM

## 2021-05-03 DIAGNOSIS — I517 Cardiomegaly: Secondary | ICD-10-CM | POA: Diagnosis not present

## 2021-05-03 MED ORDER — PREDNISONE 10 MG PO TABS
ORAL_TABLET | ORAL | 0 refills | Status: DC
  Filled 2021-05-03: qty 18, fill #0

## 2021-05-03 MED ORDER — HYDROCODONE BIT-HOMATROP MBR 5-1.5 MG/5ML PO SOLN
5.0000 mL | Freq: Four times a day (QID) | ORAL | 0 refills | Status: DC | PRN
  Filled 2021-05-03: qty 180, 9d supply, fill #0

## 2021-05-03 MED ORDER — LEVOFLOXACIN 500 MG PO TABS
500.0000 mg | ORAL_TABLET | Freq: Every day | ORAL | 0 refills | Status: DC
  Filled 2021-05-03: qty 10, 10d supply, fill #0

## 2021-05-03 MED ORDER — HYDROCODONE BIT-HOMATROP MBR 5-1.5 MG/5ML PO SOLN: 5.0000 mL | Freq: Four times a day (QID) | ORAL | 0 refills | Status: AC | PRN

## 2021-05-03 MED ORDER — LEVOFLOXACIN 500 MG PO TABS: 500.0000 mg | ORAL_TABLET | Freq: Every day | ORAL | 0 refills | Status: AC

## 2021-05-03 MED ORDER — PREDNISONE 10 MG PO TABS: ORAL_TABLET | ORAL | 0 refills | Status: DC

## 2021-05-03 NOTE — Progress Notes (Signed)
Patient ID: Megan Duran, female   DOB: [birth year], 40 y.o.   MRN: [id number]        Chief Complaint: follow up HTN, acute on chronic sinusitis with sob/wheezing       HPI:  Megan Duran is a 40 y.o. female here with onset symptoms dec 13 when also tested home + COVID with worst of symptoms over the next 3 days, but then has had worsening symptoms again -  Here with 2-3 days acute onset fever, facial pain, pressure, headache, general weakness and malaise, and greenish d/c, with mild ST and cough, but pt denies chest pain, wheezing, increased sob or doe, orthopnea, PND, increased LE swelling, palpitations, dizziness or syncope except for onset mild wheezing, sob/ dyspnea.  Also has of recent chronic sinus pain and congestion -  Saw ENT early dec with plan for f/u CT and possible left maxillary sinus surgury per pt.  BP usually much more well controlled, but stressed today with holidays and illness, and is supposed to working as well.         Wt Readings from Last 3 Encounters:  05/03/21 299 lb (135.6 kg)  12/05/20 282 lb (127.9 kg)  03/31/17 257 lb (116.6 kg)   BP Readings from Last 3 Encounters:  05/03/21 (!) 139/103  04/02/21 (!) 144/98  03/15/21 (!) 150/86         Past Medical History:  Diagnosis Date   Asthma    only uses maintanence inhaler   Gastroparesis    GERD (gastroesophageal reflux disease)    takes meds   History of MRSA infection    IBS (irritable bowel syndrome)    Migraine    Past Surgical History:  Procedure Laterality Date   GALLBLADDER SURGERY  2007    reports that she has never smoked. She has never used smokeless tobacco. She reports that she does not drink alcohol and does not use drugs. family history includes AVM in her mother; Anxiety disorder in her mother; Atrial fibrillation in her mother; Breast cancer in her mother; Congestive Heart Failure in her mother; Dementia in her maternal grandmother; Depression in her mother and sister; Diabetes in her  father, mother, and sister; Fibromyalgia in her mother; GER disease in her sister; Heart Problems in her mother; Heart attack in her father, mother, and paternal grandmother; High Cholesterol in her father and sister; Hypertension in her father, maternal grandmother, mother, and sister; Hyperthyroidism in her maternal grandmother; Migraines in her sister; Obesity in her father; Pancreatic cancer in her maternal aunt and maternal uncle; Pancreatitis in her sister; Stroke in her mother; Throat cancer in her maternal grandfather. Allergies  Allergen Reactions   Bactrim [Sulfamethoxazole-Trimethoprim] Hives   Biaxin [Clarithromycin] Hives   Doxycycline Hives   Effexor [Venlafaxine] Hives   Erythromycin Hives   Imitrex [Sumatriptan] Hives   Keflex [Cephalexin] Hives   Minocycline Hives   Current Outpatient Medications on File Prior to Visit  Medication Sig Dispense Refill   Budeson-Glycopyrrol-Formoterol (BREZTRI AEROSPHERE) 160-9-4.8 MCG/ACT AERO Inhale 2 puffs into the lungs in the morning and at bedtime. 10.7 g 5   cetirizine (ZYRTEC) 10 MG tablet Can take one tablet by mouth once daily if needed. 90 tablet 1   diclofenac sodium (VOLTAREN) 1 % GEL Apply 4 g topically 4 (four) times daily. 150 g 1   fluticasone (FLONASE) 50 MCG/ACT nasal spray Use one to two sprays in each nostril once daily as directed. 48 g 1   levalbuterol (XOPENEX HFA) 45  MCG/ACT inhaler Inhale 2 puffs into the lungs every 4 to 6 hours as needed for cough or wheeze. 15 g 2   levonorgestrel (MIRENA, 52 MG,) 20 MCG/24HR IUD Mirena 20 mcg/24 hours (6 yrs) 52 mg intrauterine device  Take 1 device by intrauterine route.     magnesium 30 MG tablet Take 30 mg by mouth daily.     meclizine (ANTIVERT) 12.5 MG tablet Take 1/2- 1 tablet by mouth every 6 hours if needed for vertigo. 45 tablet 1   Multiple Vitamin (MULTIVITAMIN PO) Take by mouth.     olmesartan (BENICAR) 20 MG tablet Take 1 tablet (20 mg total) by mouth daily. 90 tablet  1   Olopatadine-Mometasone (RYALTRIS) 665-25 MCG/ACT SUSP Place 2 sprays into the nose 2 (two) times daily. 29 g 5   omeprazole (PRILOSEC) 40 MG capsule Take 1 capsule by mouth 2 times per day 60 capsule 5   ondansetron (ZOFRAN) 4 MG tablet Use 1-2 tablets every 8 hours as needed for nausea 180 tablet 1   VITAMIN D PO Take by mouth daily.     No current facility-administered medications on file prior to visit.        ROS:  All others reviewed and negative.  Objective        PE:  BP (!) 139/103 (BP Location: Left Arm, Patient Position: Sitting, Cuff Size: Normal)    Pulse (!) 129    Temp 97.9 F (36.6 C) (Oral)    Ht 5\' 6"  (1.676 m)    Wt 299 lb (135.6 kg)    SpO2 100%    BMI 48.26 kg/m                 Constitutional: Pt appears in NAD               HENT: Head: NCAT.                Right Ear: External ear normal.                 Left Ear: External ear normal.                Eyes: . Pupils are equal, round, and reactive to light. Conjunctivae and EOM are normal               Nose: without d/c or deformity; Bilat tm's with mild erythema.  Max sinus areas mild tender.  Pharynx with mild erythema, no exudate               Neck: Neck supple. Gross normal ROM               Cardiovascular: Normal rate and regular rhythm.                 Pulmonary/Chest: Effort normal and breath sounds without rales but with mild bilat wheezing.                      Neurological: Pt is alert. At baseline orientation, motor grossly intact               Skin: Skin is warm. No rashes, no other new lesions, LE edema - none               Psychiatric: Pt behavior is normal without agitation   Micro: none  Cardiac tracings I have personally interpreted today:  none  Pertinent Radiological findings (summarize): none   Lab Results  Component  Value Date   WBC 9.5 12/05/2020   HGB 13.0 12/05/2020   HCT 39.1 12/05/2020   PLT 260.0 12/05/2020   GLUCOSE 85 12/05/2020   CHOL 232 (H) 12/05/2020   TRIG 132.0  12/05/2020   HDL 43.60 12/05/2020   LDLCALC 162 (H) 12/05/2020   ALT 25 12/05/2020   AST 18 12/05/2020   NA 137 12/05/2020   K 4.0 12/05/2020   CL 101 12/05/2020   CREATININE 0.73 12/05/2020   BUN 15 12/05/2020   CO2 29 12/05/2020   TSH 0.78 12/05/2020   HGBA1C 5.7 12/05/2020   Assessment/Plan:  Megan Duran is a 40 y.o. White or Caucasian [1] female with  has a past medical history of Asthma, Gastroparesis, GERD (gastroesophageal reflux disease), History of MRSA infection, IBS (irritable bowel syndrome), and Migraine.  Sinusitis Acute on chronic and likely underlying surgical need for obstruction - for levaquin asd,  to f/u any worsening symptoms or concerns; f/u ent and CT jan 2023 as planned, work note given  Wheezing This is c/w likely bronchospastic type - for predpac asd, cough med prn, continue inhaler prn, and cxr - r/o pna  Primary hypertension BP Readings from Last 3 Encounters:  05/03/21 (!) 139/103  04/02/21 (!) 144/98  03/15/21 (!) 150/86   Uncontrolled, possibly an element of reactive today, but also has been elevated recently, pt to continue medical treatment as is for now benicar as declines chagne  Followup: No follow-ups on file.  Oliver Barre, MD 05/07/2021 6:49 AM [facility]

## 2021-05-03 NOTE — Patient Instructions (Signed)
Please take all new medication as prescribed - the antibiotic, cough medicine, prednisone  Please continue all other medications as before, including the inhaler as needed  Please have the pharmacy call with any other refills you may need.  Please keep your appointments with your specialists as you may have planned  You are given the work note today  Please go to the XRAY Department in the first floor for the x-ray testing  You will be contacted by phone if any changes need to be made immediately.  Otherwise, you will receive a letter about your results with an explanation, but please check with MyChart first.  Please remember to sign up for MyChart if you have not done so, as this will be important to you in the future with finding out test results, communicating by private email, and scheduling acute appointments online when needed.'

## 2021-05-04 ENCOUNTER — Encounter: Payer: Self-pay | Admitting: Internal Medicine

## 2021-05-07 DIAGNOSIS — R062 Wheezing: Secondary | ICD-10-CM | POA: Insufficient documentation

## 2021-05-07 DIAGNOSIS — J329 Chronic sinusitis, unspecified: Secondary | ICD-10-CM | POA: Insufficient documentation

## 2021-05-07 NOTE — Assessment & Plan Note (Addendum)
This is c/w likely bronchospastic type - for predpac asd, cough med prn, continue inhaler prn, and cxr - r/o pna

## 2021-05-07 NOTE — Assessment & Plan Note (Addendum)
Acute on chronic and likely underlying surgical need for obstruction - for levaquin asd,  to f/u any worsening symptoms or concerns; f/u ent and CT jan 2023 as planned, work note given

## 2021-05-07 NOTE — Assessment & Plan Note (Signed)
BP Readings from Last 3 Encounters:  05/03/21 (!) 139/103  04/02/21 (!) 144/98  03/15/21 (!) 150/86   Uncontrolled, possibly an element of reactive today, but also has been elevated recently, pt to continue medical treatment as is for now benicar as declines chagne

## 2021-05-09 ENCOUNTER — Other Ambulatory Visit: Payer: Self-pay

## 2021-05-09 ENCOUNTER — Encounter: Payer: Self-pay | Admitting: Allergy and Immunology

## 2021-05-09 ENCOUNTER — Ambulatory Visit: Payer: 59 | Admitting: Allergy and Immunology

## 2021-05-09 VITALS — BP 130/82 | HR 140 | Resp 16

## 2021-05-09 DIAGNOSIS — J4540 Moderate persistent asthma, uncomplicated: Secondary | ICD-10-CM

## 2021-05-09 DIAGNOSIS — J3089 Other allergic rhinitis: Secondary | ICD-10-CM

## 2021-05-09 DIAGNOSIS — K219 Gastro-esophageal reflux disease without esophagitis: Secondary | ICD-10-CM

## 2021-05-09 DIAGNOSIS — J324 Chronic pansinusitis: Secondary | ICD-10-CM

## 2021-05-09 DIAGNOSIS — U099 Post covid-19 condition, unspecified: Secondary | ICD-10-CM | POA: Diagnosis not present

## 2021-05-09 NOTE — Progress Notes (Signed)
[place] [facility] [place] [facility] [place] - [place] [facility] [place]   Follow-up Note  Referring Provider: Etta Grandchild, MD Primary Provider: Etta Grandchild, MD Date of Office Visit: 05/09/2021  Subjective:   Megan Duran (DOB: [birth year]) is a 40 y.o. female who [facility] in re-evaluation of the following:  HPI: Megan Duran returns to this clinic in evaluation of asthma and allergic rhinitis and ETD and chronic sinusitis and LPR.  My last interaction with her in this clinic was 02 April 2021.  She underwent a prolonged course of clindamycin which completely cleared up her airway issue but unfortunately she contracted COVID on 24 April 2021 with lots of head congestion and shortness of breath for which she saw her primary care doctor and was treated with Levaquin and prednisone after receiving a normal chest x-ray.  Although her lower airway is better she still has a fair amount of congestion of her head although that has improved somewhat.  Currently she has a headache across her malar region and some nasal congestion.  She has not seen any ugly nasal discharge recently.  She did visit with Dr. Annalee Genta regarding evaluation of her chronic sinusitis and she is scheduled to have a repeat CT scan of her sinuses on 05 June 2021 in further investigation of that issue.  Currently her asthma is not particularly active when she continues on [place].  She does not need to use a short acting bronchodilator.  Her ears have not been bothering her and she has not had to use any meclizine.  Her reflux is under very good control with her omeprazole.  Allergies as of 05/09/2021       Reactions   Bactrim [sulfamethoxazole-trimethoprim] Hives   Biaxin [clarithromycin] Hives   Doxycycline Hives   Effexor [venlafaxine] Hives   Erythromycin Hives   Imitrex [sumatriptan] Hives   Keflex [cephalexin] Hives   Minocycline Hives        Medication  List    Breztri Aerosphere 160-9-4.8 MCG/ACT Aero Generic drug: Budeson-Glycopyrrol-Formoterol Inhale 2 puffs into the lungs in the morning and at bedtime.   cetirizine 10 MG tablet Commonly known as: ZYRTEC Can take one tablet by mouth once daily if needed.   diclofenac sodium 1 % Gel Commonly known as: VOLTAREN Apply 4 g topically 4 (four) times daily.   fluticasone 50 MCG/ACT nasal spray Commonly known as: Flonase Use one to two sprays in each nostril once daily as directed.   HYDROcodone bit-homatropine 5-1.5 MG/5ML syrup Commonly known as: HYCODAN Take 5 mLs by mouth every 6 (six) hours as needed for up to 10 days.   levalbuterol 45 MCG/ACT inhaler Commonly known as: Xopenex HFA Inhale 2 puffs into the lungs every 4 to 6 hours as needed for cough or wheeze.   levofloxacin 500 MG tablet Commonly known as: Levaquin Take 1 tablet (500 mg total) by mouth daily for 10 days.   magnesium 30 MG tablet Take 30 mg by mouth daily.   meclizine 12.5 MG tablet Commonly known as: ANTIVERT Take 1/2- 1 tablet by mouth every 6 hours if needed for vertigo.   Mirena (52 MG) 20 MCG/DAY Iud Generic drug: levonorgestrel Mirena 20 mcg/24 hours (6 yrs) 52 mg intrauterine device  Take 1 device by intrauterine route.   MULTIVITAMIN PO Take by mouth.   olmesartan 20 MG tablet Commonly known as: BENICAR Take 1 tablet (20 mg total) by mouth daily.   omeprazole 40 MG  capsule Commonly known as: PRILOSEC Take 1 capsule by mouth 2 times per day   ondansetron 4 MG tablet Commonly known as: Zofran Use 1-2 tablets every 8 hours as needed for nausea   predniSONE 10 MG tablet Commonly known as: DELTASONE 3 tabs by mouth per day for 3 days,2tabs per day for 3 days,1tab per day for 3 days   Ryaltris 665-25 MCG/ACT Susp Generic drug: Olopatadine-Mometasone Place 2 sprays into the nose 2 (two) times daily.   VITAMIN D PO Take by mouth daily.    Past Medical History:  Diagnosis Date    Asthma    only uses maintanence inhaler   Gastroparesis    GERD (gastroesophageal reflux disease)    takes meds   History of MRSA infection    IBS (irritable bowel syndrome)    Migraine     Past Surgical History:  Procedure Laterality Date   GALLBLADDER SURGERY  2007    Review of systems negative except as noted in HPI / PMHx or noted below:  Review of Systems  Constitutional: Negative.   HENT: Negative.    Eyes: Negative.   Respiratory: Negative.    Cardiovascular: Negative.   Gastrointestinal: Negative.   Genitourinary: Negative.   Musculoskeletal: Negative.   Skin: Negative.   Neurological: Negative.   Endo/Heme/Allergies: Negative.   Psychiatric/Behavioral: Negative.      Objective:   Vitals:   05/09/21 1139  BP: 130/82  Pulse: (!) 140  Resp: 16  SpO2: 99%          Physical Exam Constitutional:      Appearance: She is not diaphoretic.  HENT:     Head: Normocephalic.     Right Ear: Tympanic membrane, ear canal and external ear normal.     Left Ear: Tympanic membrane, ear canal and external ear normal.     Nose: Nose normal. No mucosal edema or rhinorrhea.     Mouth/Throat:     Pharynx: Uvula midline. No oropharyngeal exudate.  Eyes:     Conjunctiva/sclera: Conjunctivae normal.  Neck:     Thyroid: No thyromegaly.     Trachea: Trachea normal. No tracheal tenderness or tracheal deviation.  Cardiovascular:     Rate and Rhythm: Normal rate and regular rhythm.     Heart sounds: Normal heart sounds, S1 normal and S2 normal. No murmur heard. Pulmonary:     Effort: No respiratory distress.     Breath sounds: Normal breath sounds. No stridor. No wheezing or rales.  Lymphadenopathy:     Head:     Right side of head: No tonsillar adenopathy.     Left side of head: No tonsillar adenopathy.     Cervical: No cervical adenopathy.  Skin:    Findings: No erythema or rash.     Nails: There is no clubbing.  Neurological:     Mental Status: She is alert.     Diagnostics: none  Assessment and Plan:   1. Asthma, moderate persistent, well-controlled   2. Perennial allergic rhinitis   3. Chronic pansinusitis   4. Post-COVID syndrome   5. LPRD (laryngopharyngeal reflux disease)     1.  Continue to treat and prevent inflammation:   A. Breztri - 2 inhalations 2 times per day (empty lungs) B. Ryaltris - 2 sprays each nostril 2 times per day (specialty pharmacy)  2. Continue to treat and prevent reflux:  A. Omeprazole 40 mg - 1 tablet 2 times per day  3. If needed:  A. Nasal saline  a few times per day B. Xopenex - 2 inhalations 2 times per day C. Positive pressure inflation of ears D. Meclizine 12.5 mg - 1-2 tablets every 6 hours if needed E. Mucinex DM - 1-2 tablets 2 times per day F. Cetrizine 10 mg - 1 tablet 2 times per day  5.  Contact clinic next week.  If head better will continue on plan.  If head still very congested and with headache will restart prolonged clindamycin until visit with Dr. Annalee Genta late January 2023.  Elexius will utilize a plan of anti-inflammatory medications for her airway issue and continue to use therapy for reflux which is actually working pretty well.  Unfortunately, her recent COVID infection is going to set her back somewhat.  I am hopeful that with each passing week she is going to clear her this issue with her airway that was reexacerbated by her recent COVID infection.  She will contact me next week and we will make a decision about how to move forward depending on her status.  Laurette Schimke, MD Allergy / Immunology [facility]

## 2021-05-09 NOTE — Patient Instructions (Addendum)
°  1.  Continue to treat and prevent inflammation:   A. Breztri - 2 inhalations 2 times per day (empty lungs) B. Ryaltris - 2 sprays each nostril 2 times per day (specialty pharmacy)  2. Continue to treat and prevent reflux:  A. Omeprazole 40 mg - 1 tablet 2 times per day  3. If needed:  A. Nasal saline a few times per day B. Xopenex - 2 inhalations 2 times per day C. Positive pressure inflation of ears D. Meclizine 12.5 mg - 1-2 tablets every 6 hours if needed E. Mucinex DM - 1-2 tablets 2 times per day F. Cetrizine 10 mg - 1 tablet 2 times per day  5.  Contact clinic next week.  If head better will continue on plan.  If head still very congested and with headache will restart prolonged clindamycin until visit with Dr. Annalee Genta late January 2023.

## 2021-05-10 ENCOUNTER — Encounter: Payer: Self-pay | Admitting: Allergy and Immunology

## 2021-05-11 ENCOUNTER — Other Ambulatory Visit (HOSPITAL_COMMUNITY): Payer: Self-pay

## 2021-05-16 ENCOUNTER — Telehealth: Payer: Self-pay | Admitting: Allergy and Immunology

## 2021-05-16 ENCOUNTER — Other Ambulatory Visit: Payer: Self-pay | Admitting: *Deleted

## 2021-05-16 MED ORDER — CLINDAMYCIN HCL 150 MG PO CAPS
ORAL_CAPSULE | ORAL | 0 refills | Status: DC
Start: 2021-05-16 — End: 2021-07-02

## 2021-05-16 NOTE — Telephone Encounter (Signed)
Informed patient of Dr. Kathyrn Lass message. She would like prescription sent to [facility] in [place].

## 2021-05-16 NOTE — Telephone Encounter (Signed)
Please have Cloey use clindamycin 150-1 tablet 3 times per day for 14 days.

## 2021-05-16 NOTE — Telephone Encounter (Signed)
RX sent

## 2021-05-16 NOTE — Telephone Encounter (Signed)
See note below

## 2021-05-16 NOTE — Telephone Encounter (Signed)
Patient called the office today with an update. She states she is somewhat better, the sinus congestion and headache is still there but not as bad. She states clindamycin really helps and will benefit from it. Her COVID fatigue has went away, as well.

## 2021-06-04 ENCOUNTER — Other Ambulatory Visit: Payer: Self-pay | Admitting: Internal Medicine

## 2021-06-04 DIAGNOSIS — I10 Essential (primary) hypertension: Secondary | ICD-10-CM

## 2021-06-04 DIAGNOSIS — I119 Hypertensive heart disease without heart failure: Secondary | ICD-10-CM

## 2021-06-04 MED ORDER — OLMESARTAN MEDOXOMIL 20 MG PO TABS
20.0000 mg | ORAL_TABLET | Freq: Every day | ORAL | 1 refills | Status: DC
  Filled 2021-06-04: qty 90, 90d supply, fill #0
  Filled 2021-09-10: qty 90, 90d supply, fill #1

## 2021-06-05 ENCOUNTER — Other Ambulatory Visit (HOSPITAL_COMMUNITY): Payer: Self-pay

## 2021-06-05 DIAGNOSIS — J3489 Other specified disorders of nose and nasal sinuses: Secondary | ICD-10-CM | POA: Diagnosis not present

## 2021-06-05 DIAGNOSIS — J342 Deviated nasal septum: Secondary | ICD-10-CM | POA: Diagnosis not present

## 2021-06-05 DIAGNOSIS — J324 Chronic pansinusitis: Secondary | ICD-10-CM | POA: Diagnosis not present

## 2021-06-05 DIAGNOSIS — J329 Chronic sinusitis, unspecified: Secondary | ICD-10-CM | POA: Diagnosis not present

## 2021-06-05 DIAGNOSIS — J343 Hypertrophy of nasal turbinates: Secondary | ICD-10-CM | POA: Diagnosis not present

## 2021-06-14 ENCOUNTER — Other Ambulatory Visit (HOSPITAL_COMMUNITY): Payer: Self-pay

## 2021-06-16 ENCOUNTER — Other Ambulatory Visit: Payer: Self-pay | Admitting: Otolaryngology

## 2021-07-02 ENCOUNTER — Other Ambulatory Visit: Payer: Self-pay

## 2021-07-02 ENCOUNTER — Encounter (HOSPITAL_BASED_OUTPATIENT_CLINIC_OR_DEPARTMENT_OTHER): Payer: Self-pay | Admitting: Otolaryngology

## 2021-07-09 NOTE — Anesthesia Preprocedure Evaluation (Addendum)
Anesthesia Evaluation  Patient identified by MRN, date of birth, ID band Patient awake    Reviewed: Allergy & Precautions, NPO status , Patient's Chart, lab work & pertinent test results  History of Anesthesia Complications (+) PONV and history of anesthetic complications  Airway Mallampati: IV       Dental  (+) Teeth Intact, Dental Advisory Given   Pulmonary asthma ,  Used inhaler last night, rescue inhaler once a month Snores at night, no witnessed apneas, has never had sleep study    Pulmonary exam normal breath sounds clear to auscultation       Cardiovascular hypertension (157/102 in preop, per pt normally 130-140/80-90), Pt. on medications  Rhythm:Regular Rate:Normal     Neuro/Psych  Headaches, negative psych ROS   GI/Hepatic Neg liver ROS, GERD  Medicated and Controlled,  Endo/Other  Morbid obesityBMI 48  Renal/GU negative Renal ROS  negative genitourinary   Musculoskeletal  (+) Arthritis , Osteoarthritis,    Abdominal (+) + obese,   Peds  Hematology negative hematology ROS (+)   Anesthesia Other Findings   Reproductive/Obstetrics negative OB ROS                            Anesthesia Physical Anesthesia Plan  ASA: 3  Anesthesia Plan: General   Post-op Pain Management: Tylenol PO (pre-op)*   Induction: Intravenous  PONV Risk Score and Plan: 4 or greater and Ondansetron, Dexamethasone, Midazolam, Scopolamine patch - Pre-op, Treatment may vary due to age or medical condition, Diphenhydramine, Propofol infusion and TIVA  Airway Management Planned: Oral ETT  Additional Equipment: None  Intra-op Plan:   Post-operative Plan: Extubation in OR  Informed Consent: I have reviewed the patients History and Physical, chart, labs and discussed the procedure including the risks, benefits and alternatives for the proposed anesthesia with the patient or authorized representative who  has indicated his/her understanding and acceptance.     Dental advisory given  Plan Discussed with: CRNA  Anesthesia Plan Comments:       Anesthesia Quick Evaluation

## 2021-07-11 ENCOUNTER — Encounter (HOSPITAL_BASED_OUTPATIENT_CLINIC_OR_DEPARTMENT_OTHER): Payer: Self-pay | Admitting: Otolaryngology

## 2021-07-11 ENCOUNTER — Other Ambulatory Visit: Payer: Self-pay

## 2021-07-11 ENCOUNTER — Encounter (HOSPITAL_BASED_OUTPATIENT_CLINIC_OR_DEPARTMENT_OTHER)
Admission: RE | Disposition: A | Discharge status: Home / Self Care | Payer: Self-pay | Source: Home / Self Care | Attending: Otolaryngology

## 2021-07-11 ENCOUNTER — Ambulatory Visit (HOSPITAL_BASED_OUTPATIENT_CLINIC_OR_DEPARTMENT_OTHER): Payer: 59 | Admitting: Anesthesiology

## 2021-07-11 ENCOUNTER — Ambulatory Visit (HOSPITAL_BASED_OUTPATIENT_CLINIC_OR_DEPARTMENT_OTHER)
Admission: RE | Admit: 2021-07-11 | Discharge: 2021-07-11 | Disposition: A | Discharge status: Home / Self Care | Payer: 59 | Attending: Otolaryngology | Admitting: Otolaryngology

## 2021-07-11 ENCOUNTER — Other Ambulatory Visit (HOSPITAL_COMMUNITY): Payer: Self-pay

## 2021-07-11 DIAGNOSIS — K219 Gastro-esophageal reflux disease without esophagitis: Secondary | ICD-10-CM | POA: Diagnosis not present

## 2021-07-11 DIAGNOSIS — J343 Hypertrophy of nasal turbinates: Secondary | ICD-10-CM | POA: Diagnosis not present

## 2021-07-11 DIAGNOSIS — M1990 Unspecified osteoarthritis, unspecified site: Secondary | ICD-10-CM

## 2021-07-11 DIAGNOSIS — Z79899 Other long term (current) drug therapy: Secondary | ICD-10-CM | POA: Diagnosis not present

## 2021-07-11 DIAGNOSIS — R0683 Snoring: Secondary | ICD-10-CM

## 2021-07-11 DIAGNOSIS — Z7951 Long term (current) use of inhaled steroids: Secondary | ICD-10-CM | POA: Insufficient documentation

## 2021-07-11 DIAGNOSIS — J324 Chronic pansinusitis: Secondary | ICD-10-CM | POA: Diagnosis not present

## 2021-07-11 DIAGNOSIS — J45909 Unspecified asthma, uncomplicated: Secondary | ICD-10-CM | POA: Insufficient documentation

## 2021-07-11 DIAGNOSIS — Z793 Long term (current) use of hormonal contraceptives: Secondary | ICD-10-CM | POA: Diagnosis not present

## 2021-07-11 DIAGNOSIS — I10 Essential (primary) hypertension: Secondary | ICD-10-CM

## 2021-07-11 DIAGNOSIS — J342 Deviated nasal septum: Secondary | ICD-10-CM | POA: Diagnosis not present

## 2021-07-11 DIAGNOSIS — J310 Chronic rhinitis: Secondary | ICD-10-CM | POA: Diagnosis not present

## 2021-07-11 HISTORY — PX: NASAL SEPTOPLASTY W/ TURBINOPLASTY: SHX2070

## 2021-07-11 HISTORY — DX: Other specified postprocedural states: Z98.890

## 2021-07-11 HISTORY — DX: Nausea with vomiting, unspecified: R11.2

## 2021-07-11 LAB — POCT PREGNANCY, URINE: Preg Test, Ur: NEGATIVE

## 2021-07-11 SURGERY — SEPTOPLASTY, NOSE, WITH NASAL TURBINATE REDUCTION
Anesthesia: General | Site: Nose | Laterality: Bilateral

## 2021-07-11 MED ORDER — MIDAZOLAM HCL 5 MG/5ML IJ SOLN
INTRAMUSCULAR | Status: DC | PRN
  Administered 2021-07-11: 2 mg via INTRAVENOUS

## 2021-07-11 MED ORDER — SCOPOLAMINE 1 MG/3DAYS TD PT72
1.0000 | MEDICATED_PATCH | TRANSDERMAL | Status: DC
  Administered 2021-07-11: 1.5 mg via TRANSDERMAL

## 2021-07-11 MED ORDER — ROCURONIUM BROMIDE 10 MG/ML (PF) SYRINGE
PREFILLED_SYRINGE | INTRAVENOUS | Status: DC | PRN
  Administered 2021-07-11: 40 mg via INTRAVENOUS
  Administered 2021-07-11: 10 mg via INTRAVENOUS

## 2021-07-11 MED ORDER — ONDANSETRON HCL 4 MG/2ML IJ SOLN
INTRAMUSCULAR | Status: AC
  Filled 2021-07-11: qty 2

## 2021-07-11 MED ORDER — LEVOFLOXACIN 500 MG PO TABS
500.0000 mg | ORAL_TABLET | Freq: Every day | ORAL | 0 refills | Status: AC
  Filled 2021-07-11: qty 7, 7d supply, fill #0

## 2021-07-11 MED ORDER — ONDANSETRON 4 MG PO TBDP
4.0000 mg | ORAL_TABLET | Freq: Three times a day (TID) | ORAL | 0 refills | Status: DC | PRN
  Filled 2021-07-11: qty 20, 7d supply, fill #0

## 2021-07-11 MED ORDER — FENTANYL CITRATE (PF) 100 MCG/2ML IJ SOLN
INTRAMUSCULAR | Status: AC
  Filled 2021-07-11: qty 2

## 2021-07-11 MED ORDER — ROCURONIUM BROMIDE 10 MG/ML (PF) SYRINGE
PREFILLED_SYRINGE | INTRAVENOUS | Status: AC
  Filled 2021-07-11: qty 10

## 2021-07-11 MED ORDER — HYDROCODONE-ACETAMINOPHEN 5-325 MG PO TABS
ORAL_TABLET | ORAL | 0 refills | Status: DC
  Filled 2021-07-11: qty 20, 5d supply, fill #0

## 2021-07-11 MED ORDER — DEXAMETHASONE SODIUM PHOSPHATE 4 MG/ML IJ SOLN
INTRAMUSCULAR | Status: DC | PRN
  Administered 2021-07-11: 10 mg via INTRAVENOUS

## 2021-07-11 MED ORDER — SUGAMMADEX SODIUM 500 MG/5ML IV SOLN
INTRAVENOUS | Status: DC | PRN
  Administered 2021-07-11: 350 mg via INTRAVENOUS

## 2021-07-11 MED ORDER — LIDOCAINE 2% (20 MG/ML) 5 ML SYRINGE
INTRAMUSCULAR | Status: DC | PRN
  Administered 2021-07-11: 80 mg via INTRAVENOUS

## 2021-07-11 MED ORDER — ONDANSETRON HCL 4 MG/2ML IJ SOLN
INTRAMUSCULAR | Status: DC | PRN
Start: 2021-07-11 — End: 2021-07-11
  Administered 2021-07-11: 4 mg via INTRAVENOUS

## 2021-07-11 MED ORDER — OXYCODONE HCL 5 MG/5ML PO SOLN: 5.0000 mg | Freq: Once | ORAL | Status: DC | PRN

## 2021-07-11 MED ORDER — SUCCINYLCHOLINE CHLORIDE 200 MG/10ML IV SOSY
PREFILLED_SYRINGE | INTRAVENOUS | Status: AC
  Filled 2021-07-11: qty 10

## 2021-07-11 MED ORDER — LIDOCAINE 2% (20 MG/ML) 5 ML SYRINGE
INTRAMUSCULAR | Status: AC
  Filled 2021-07-11: qty 5

## 2021-07-11 MED ORDER — CIPROFLOXACIN IN D5W 400 MG/200ML IV SOLN
INTRAVENOUS | Status: DC | PRN
  Administered 2021-07-11: 400 mg via INTRAVENOUS

## 2021-07-11 MED ORDER — OXYMETAZOLINE HCL 0.05 % NA SOLN
NASAL | Status: AC
  Filled 2021-07-11: qty 60

## 2021-07-11 MED ORDER — CIPROFLOXACIN IN D5W 400 MG/200ML IV SOLN
INTRAVENOUS | Status: AC
  Filled 2021-07-11: qty 200

## 2021-07-11 MED ORDER — MEPERIDINE HCL 25 MG/ML IJ SOLN: 6.2500 mg | INTRAMUSCULAR | Status: DC | PRN

## 2021-07-11 MED ORDER — PROPOFOL 10 MG/ML IV BOLUS
INTRAVENOUS | Status: DC | PRN
  Administered 2021-07-11: 200 mg via INTRAVENOUS

## 2021-07-11 MED ORDER — PROPOFOL 500 MG/50ML IV EMUL
INTRAVENOUS | Status: DC | PRN
  Administered 2021-07-11: 150 ug/kg/min via INTRAVENOUS

## 2021-07-11 MED ORDER — SCOPOLAMINE 1 MG/3DAYS TD PT72
MEDICATED_PATCH | TRANSDERMAL | Status: AC
  Filled 2021-07-11: qty 1

## 2021-07-11 MED ORDER — BUPIVACAINE HCL (PF) 0.25 % IJ SOLN
INTRAMUSCULAR | Status: AC
  Filled 2021-07-11: qty 30

## 2021-07-11 MED ORDER — FENTANYL CITRATE (PF) 100 MCG/2ML IJ SOLN
INTRAMUSCULAR | Status: DC | PRN
  Administered 2021-07-11: 100 ug via INTRAVENOUS

## 2021-07-11 MED ORDER — HYDROMORPHONE HCL 1 MG/ML IJ SOLN
0.2500 mg | INTRAMUSCULAR | Status: DC | PRN
  Administered 2021-07-11 (×2): 0.5 mg via INTRAVENOUS

## 2021-07-11 MED ORDER — LIDOCAINE HCL (PF) 1 % IJ SOLN
INTRAMUSCULAR | Status: AC
  Filled 2021-07-11: qty 60

## 2021-07-11 MED ORDER — MIDAZOLAM HCL 2 MG/2ML IJ SOLN
INTRAMUSCULAR | Status: AC
  Filled 2021-07-11: qty 2

## 2021-07-11 MED ORDER — LACTATED RINGERS IV SOLN: INTRAVENOUS | Status: DC

## 2021-07-11 MED ORDER — LIDOCAINE-EPINEPHRINE 1 %-1:100000 IJ SOLN
INTRAMUSCULAR | Status: AC
  Filled 2021-07-11: qty 2

## 2021-07-11 MED ORDER — LIDOCAINE-EPINEPHRINE 1 %-1:100000 IJ SOLN
INTRAMUSCULAR | Status: DC | PRN
  Administered 2021-07-11: 5 mL

## 2021-07-11 MED ORDER — MUPIROCIN 2 % EX OINT
TOPICAL_OINTMENT | CUTANEOUS | Status: AC
  Filled 2021-07-11: qty 44

## 2021-07-11 MED ORDER — EPINEPHRINE PF 1 MG/ML IJ SOLN
INTRAMUSCULAR | Status: AC
  Filled 2021-07-11: qty 1

## 2021-07-11 MED ORDER — DEXAMETHASONE SODIUM PHOSPHATE 10 MG/ML IJ SOLN
INTRAMUSCULAR | Status: AC
  Filled 2021-07-11: qty 1

## 2021-07-11 MED ORDER — OXYCODONE HCL 5 MG PO TABS: 5.0000 mg | ORAL_TABLET | Freq: Once | ORAL | Status: DC | PRN

## 2021-07-11 MED ORDER — HYDROMORPHONE HCL 1 MG/ML IJ SOLN
INTRAMUSCULAR | Status: AC
  Filled 2021-07-11: qty 0.5

## 2021-07-11 MED ORDER — AMISULPRIDE (ANTIEMETIC) 5 MG/2ML IV SOLN
10.0000 mg | Freq: Once | INTRAVENOUS | Status: AC | PRN
  Administered 2021-07-11: 10 mg via INTRAVENOUS

## 2021-07-11 MED ORDER — MUPIROCIN 2 % EX OINT
TOPICAL_OINTMENT | CUTANEOUS | Status: DC | PRN
  Administered 2021-07-11: 1 via TOPICAL

## 2021-07-11 MED ORDER — ONDANSETRON HCL 4 MG/2ML IJ SOLN: 4.0000 mg | Freq: Once | INTRAMUSCULAR | Status: DC | PRN

## 2021-07-11 MED ORDER — AMISULPRIDE (ANTIEMETIC) 5 MG/2ML IV SOLN
INTRAVENOUS | Status: AC
  Filled 2021-07-11: qty 4

## 2021-07-11 MED ORDER — OXYMETAZOLINE HCL 0.05 % NA SOLN
NASAL | Status: DC | PRN
Start: 2021-07-11 — End: 2021-07-11
  Administered 2021-07-11: 1 via TOPICAL

## 2021-07-11 MED ORDER — SUCCINYLCHOLINE CHLORIDE 200 MG/10ML IV SOSY
PREFILLED_SYRINGE | INTRAVENOUS | Status: DC | PRN
  Administered 2021-07-11: 140 mg via INTRAVENOUS

## 2021-07-11 MED ORDER — ACETAMINOPHEN 500 MG PO TABS
1000.0000 mg | ORAL_TABLET | Freq: Once | ORAL | Status: AC
  Administered 2021-07-11: 1000 mg via ORAL

## 2021-07-11 MED ORDER — ACETAMINOPHEN 500 MG PO TABS
ORAL_TABLET | ORAL | Status: AC
  Filled 2021-07-11: qty 2

## 2021-07-11 SURGICAL SUPPLY — 33 items
ATTRACTOMAT 16X20 MAGNETIC DRP (DRAPES) IMPLANT
BLADE SURG 15 STRL LF DISP TIS (BLADE) IMPLANT
BLADE SURG 15 STRL SS (BLADE) ×2
CANISTER SUCT 1200ML W/VALVE (MISCELLANEOUS) ×2 IMPLANT
COAGULATOR SUCT 8FR VV (MISCELLANEOUS) ×1 IMPLANT
DRSG NASOPORE 8CM (GAUZE/BANDAGES/DRESSINGS) IMPLANT
DRSG TELFA 3X8 NADH (GAUZE/BANDAGES/DRESSINGS) IMPLANT
ELECT REM PT RETURN 9FT ADLT (ELECTROSURGICAL) ×2
ELECTRODE REM PT RTRN 9FT ADLT (ELECTROSURGICAL) IMPLANT
GLOVE SURG ENC TEXT LTX SZ7 (GLOVE) ×3 IMPLANT
GLOVE SURG POLYISO LF SZ7 (GLOVE) ×1 IMPLANT
GLOVE SURG UNDER POLY LF SZ7 (GLOVE) ×1 IMPLANT
GOWN STRL REUS W/ TWL LRG LVL3 (GOWN DISPOSABLE) ×2 IMPLANT
GOWN STRL REUS W/TWL LRG LVL3 (GOWN DISPOSABLE) ×4
IV SET EXT 30 76VOL 4 MALE LL (IV SETS) ×2 IMPLANT
NDL HYPO 27GX1-1/4 (NEEDLE) ×1 IMPLANT
NEEDLE HYPO 27GX1-1/4 (NEEDLE) ×2 IMPLANT
NS IRRIG 1000ML POUR BTL (IV SOLUTION) ×1 IMPLANT
PACK BASIN DAY SURGERY FS (CUSTOM PROCEDURE TRAY) ×2 IMPLANT
PACK ENT DAY SURGERY (CUSTOM PROCEDURE TRAY) ×2 IMPLANT
PAD DRESSING TELFA 3X8 NADH (GAUZE/BANDAGES/DRESSINGS) IMPLANT
SLEEVE SCD COMPRESS KNEE MED (STOCKING) ×1 IMPLANT
SPIKE FLUID TRANSFER (MISCELLANEOUS) IMPLANT
SPLINT NASAL AIRWAY SILICONE (MISCELLANEOUS) ×2 IMPLANT
SPONGE GAUZE 2X2 8PLY STRL LF (GAUZE/BANDAGES/DRESSINGS) ×2 IMPLANT
SPONGE NEURO XRAY DETECT 1X3 (DISPOSABLE) ×2 IMPLANT
SPONGE SURGIFOAM ABS GEL 12-7 (HEMOSTASIS) IMPLANT
SUT ETHILON 3 0 PS 1 (SUTURE) ×2 IMPLANT
SUT PLAIN 4 0 ~~LOC~~ 1 (SUTURE) ×2 IMPLANT
TOWEL GREEN STERILE FF (TOWEL DISPOSABLE) ×2 IMPLANT
TUBE SALEM SUMP 12R W/ARV (TUBING) IMPLANT
TUBE SALEM SUMP 16 FR W/ARV (TUBING) ×1 IMPLANT
YANKAUER SUCT BULB TIP NO VENT (SUCTIONS) ×2 IMPLANT

## 2021-07-11 NOTE — Op Note (Signed)
Operative Note: SEPTOPLASTY AND INFERIOR TURBINATE REDUCTION ? ?Patient: Megan Duran ? ?Medical record number: [id number] ? ?Date:07/11/2021 ? ?Pre-operative Indications: 1. Deviated nasal septum with nasal airway obstruction ?    2.  Bilateral inferior turbinate hypertrophy ? ?Postoperative Indications: Same ? ?Surgical Procedure: 1.  Nasal Septoplasty ?   2.  Bilateral Inferior Turbinate Reduction ? ?Anesthesia: GET ? ?Surgeon: Delsa Bern, M.D. ? ?Complications: None ? ?EBL: 50 cc ? ?Findings: Severely deviated nasal septum with airway obstruction and bilateral inferior turbinate hypertrophy. ? ? ?Brief History: ?The patient is a 41 y.o. female with a history of progressive nasal airway obstruction. The patient has been on medical therapy to reduce nasal mucosal edema including saline nasal spray and topical nasal steroids. Despite appropriate medical therapy the patient continues to have ongoing symptoms. Given the patient's history and findings, the above surgical procedures were recommended, risks and benefits were discussed in detail with the patient may understand and agree with our plan for surgery which is scheduled at [place] under general anesthesia as an outpatient. ? ?Surgical Procedure: ?The patient is brought to the operating room on 07/11/2021 and placed in supine position on the operating table. General endotracheal anesthesia was established without difficulty. When the patient was adequately anesthetized, surgical timeout was performed and correct identification of the patient and the surgical procedure. The patient's nose was then injected with 5 cc of 1% lidocaine 1:100,000 dilution epinephrine which was injected in a submucosal fashion. The patient's nose was then packed with Afrin-soaked cottonoid pledgets were left in place for approximately 10 minutes lateral vasoconstriction and hemostasis. ? ?With the patient prepped draped and prepared for surgery, nasal septoplasty was begun.  A  left anterior hemitransfixion incision was created and a mucoperichondrial flap was elevated from anterior to posterior on the left-hand side. The anterior cartilaginous septum was crossed at the midline and a mucoperichondrial flap was elevated on the patient's right.  Swivel knife was then used to resect the anterior and mid cartilaginous portion of the nasal septum.  Resected cartilage was morcellized and returned to the mucoperichondrial pocket at the occlusion of the surgical procedure.  Dissection was then carried out from anterior to posterior removing deviated bone and cartilage including a large septal spur the overlying mucosa was preserved.  With the septum brought to good midline position, the morselized cartilage was returned to the mucoperichondrial pocket and the soft tissue/mucosal flaps were reapproximated with interrupted 4-0 gut suture on a Keith needle in a horizontal mattressing fashion.  Anterior hemitransfixion incision was closed with the same stitch.  Bilateral Doyle nasal septal splints were then placed after the application of Bactroban ointment and sutured in position with a 3-0 Ethilon suture. ? ?Attention was then turned to the inferior turbinates, bilateral inferior turbinate intramural cautery was performed with cautery setting at 108 W.  2 submucosal passes were made in each inferior turbinate.  After completing cautery, anterior vertical incisions were created and overlying soft tissue was elevated, a small amount of turbinate bone was resected.  The turbinates were then outfractured to create a more patent nasal passageway. ? ?Surgical sponge count was correct. An oral gastric tube was passed and the stomach contents were aspirated. Patient was awakened from anesthetic and transferred from the operating room to the recovery room in stable condition. There were no complications and blood loss was 50cc. ? ? ?Delsa Bern, M.D. ?[facility] ?07/11/2021  ?

## 2021-07-11 NOTE — Transfer of Care (Signed)
Immediate Anesthesia Transfer of Care Note ? ?Patient: Megan Duran ? ?Procedure(s) Performed: NASAL SEPTOPLASTY WITH TURBINATE REDUCTION (Bilateral: Nose) ? ?Patient Location: PACU ? ?Anesthesia Type:General ? ?Level of Consciousness: drowsy, patient cooperative and responds to stimulation ? ?Airway & Oxygen Therapy: Patient Spontanous Breathing and Patient connected to face mask oxygen ? ?Post-op Assessment: Report given to RN and Post -op Vital signs reviewed and stable ? ?Post vital signs: Reviewed and stable ? ?Last Vitals:  ?Vitals Value Taken Time  ?BP 159/101 07/11/21 1151  ?Temp    ?Pulse 105 07/11/21 1152  ?Resp 17 07/11/21 1152  ?SpO2 100 % 07/11/21 1152  ?Vitals shown include unvalidated device data. ? ?Last Pain:  ?Vitals:  ? 07/11/21 0932  ?TempSrc: Oral  ?PainSc: 3   ?   ? ?Patients Stated Pain Goal: 4 (07/11/21 0932) ? ?Complications: No notable events documented. ?

## 2021-07-11 NOTE — H&P (Signed)
Megan Duran is an 41 y.o. female.   ?Chief Complaint: Deviated septum ?HPI: Hx of prog nasal obstruction ? ?Past Medical History:  ?Diagnosis Date  ? Asthma   ? only uses maintanence inhaler  ? Gastroparesis   ? GERD (gastroesophageal reflux disease)   ? takes meds  ? History of MRSA infection   ? IBS (irritable bowel syndrome)   ? Migraine   ? PONV (postoperative nausea and vomiting)   ? ? ?Past Surgical History:  ?Procedure Laterality Date  ? GALLBLADDER SURGERY  2007  ? ? ?Family History  ?Problem Relation Age of Onset  ? Anxiety disorder Mother   ? Depression Mother   ? Heart Problems Mother   ? Hypertension Mother   ? Diabetes Mother   ? AVM Mother   ? Stroke Mother   ? Atrial fibrillation Mother   ? Heart attack Mother   ? Fibromyalgia Mother   ? Breast cancer Mother   ? Congestive Heart Failure Mother   ? Hypertension Father   ? Diabetes Father   ? Obesity Father   ? Heart attack Father   ? High Cholesterol Father   ? Depression Sister   ? Pancreatitis Sister   ? Diabetes Sister   ? Hypertension Sister   ? GER disease Sister   ? High Cholesterol Sister   ? Migraines Sister   ? Pancreatic cancer Maternal Aunt   ? Pancreatic cancer Maternal Uncle   ? Dementia Maternal Grandmother   ? Hypertension Maternal Grandmother   ? Hyperthyroidism Maternal Grandmother   ? Throat cancer Maternal Grandfather   ? Heart attack Paternal Grandmother   ? ?Social History:  reports that she has never smoked. She has never used smokeless tobacco. She reports that she does not drink alcohol and does not use drugs. ? ?Allergies:  ?Allergies  ?Allergen Reactions  ? Bactrim [Sulfamethoxazole-Trimethoprim] Hives  ? Biaxin [Clarithromycin] Hives  ? Doxycycline Hives  ? Effexor [Venlafaxine] Hives  ? Erythromycin Hives  ? Imitrex [Sumatriptan] Hives  ? Keflex [Cephalexin] Hives  ? Minocycline Hives  ? ? ?Medications Prior to Admission  ?Medication Sig Dispense Refill  ? Budeson-Glycopyrrol-Formoterol (BREZTRI AEROSPHERE) 160-9-4.8  MCG/ACT AERO Inhale 2 puffs into the lungs in the morning and at bedtime. 10.7 g 5  ? cetirizine (ZYRTEC) 10 MG tablet Can take one tablet by mouth once daily if needed. 90 tablet 1  ? diclofenac sodium (VOLTAREN) 1 % GEL Apply 4 g topically 4 (four) times daily. 150 g 1  ? levalbuterol (XOPENEX HFA) 45 MCG/ACT inhaler Inhale 2 puffs into the lungs every 4 to 6 hours as needed for cough or wheeze. 15 g 2  ? levonorgestrel (MIRENA, 52 MG,) 20 MCG/24HR IUD Mirena 20 mcg/24 hours (6 yrs) 52 mg intrauterine device ? Take 1 device by intrauterine route.    ? magnesium 30 MG tablet Take 30 mg by mouth daily.    ? meclizine (ANTIVERT) 12.5 MG tablet Take 1/2- 1 tablet by mouth every 6 hours if needed for vertigo. 45 tablet 1  ? Multiple Vitamin (MULTIVITAMIN PO) Take by mouth.    ? olmesartan (BENICAR) 20 MG tablet Take 1 tablet by mouth daily. 90 tablet 1  ? Olopatadine-Mometasone (RYALTRIS) [id number] MCG/ACT SUSP Place 2 sprays into the nose 2 (two) times daily. 29 g 5  ? omeprazole (PRILOSEC) 40 MG capsule Take 1 capsule by mouth 2 times per day 60 capsule 5  ? ondansetron (ZOFRAN) 4 MG tablet Use 1-2 tablets  every 8 hours as needed for nausea 180 tablet 1  ? VITAMIN D PO Take by mouth daily.    ? ? ?No results found for this or any previous visit (from the past 48 hour(s)). ?No results found. ? ?Review of Systems  ?HENT:  Positive for congestion.   ?Respiratory: Negative.    ? ?Blood pressure (!) 157/102, pulse (!) 121, temperature 97.6 ?F (36.4 ?C), temperature source Oral, resp. rate 18, height 5\' 6"  (1.676 m), weight (!) 138.5 kg, last menstrual period 06/26/2021, SpO2 99 %, unknown if currently breastfeeding. ?Physical Exam ?Constitutional:   ?   Appearance: She is normal weight.  ?HENT:  ?   Nose:  ?   Comments: Deviated septum ?Cardiovascular:  ?   Rate and Rhythm: Normal rate.  ?Pulmonary:  ?   Effort: Pulmonary effort is normal.  ?Musculoskeletal:  ?   Cervical back: Normal range of motion.  ?Neurological:  ?    Mental Status: She is alert.  ?  ? ?Assessment/Plan ?Adm for OP septoplasty and IT reduction ? ?06/28/2021, MD ?07/11/2021, 10:36 AM ? ? ? ?

## 2021-07-11 NOTE — Discharge Instructions (Signed)

## 2021-07-11 NOTE — Anesthesia Procedure Notes (Signed)
Procedure Name: Intubation ?Date/Time: 07/11/2021 11:05 AM ?Performed by: Caren Macadam, CRNA ?Pre-anesthesia Checklist: Patient identified, Emergency Drugs available, Suction available and Patient being monitored ?Patient Re-evaluated:Patient Re-evaluated prior to induction ?Oxygen Delivery Method: Circle system utilized ?Preoxygenation: Pre-oxygenation with 100% oxygen ?Induction Type: IV induction ?Ventilation: Mask ventilation without difficulty ?Laryngoscope Size: Hyacinth Meeker and 2 ?Grade View: Grade I ?Tube type: Oral ?Tube size: 7.0 mm ?Number of attempts: 1 ?Airway Equipment and Method: Stylet and Video-laryngoscopy ?Placement Confirmation: ETT inserted through vocal cords under direct vision, positive ETCO2 and breath sounds checked- equal and bilateral ?Secured at: 22 cm ?Tube secured with: Tape ?Dental Injury: Teeth and Oropharynx as per pre-operative assessment  ?Comments: Elective glide ? ? ? ? ?

## 2021-07-12 ENCOUNTER — Encounter (HOSPITAL_BASED_OUTPATIENT_CLINIC_OR_DEPARTMENT_OTHER): Payer: Self-pay | Admitting: Otolaryngology

## 2021-07-14 NOTE — Anesthesia Postprocedure Evaluation (Signed)
Anesthesia Post Note ? ?Patient: Megan Duran ? ?Procedure(s) Performed: NASAL SEPTOPLASTY WITH TURBINATE REDUCTION (Bilateral: Nose) ? ?  ? ?Patient location during evaluation: PACU ?Anesthesia Type: General ?Level of consciousness: awake and alert ?Pain management: pain level controlled ?Vital Signs Assessment: post-procedure vital signs reviewed and stable ?Respiratory status: spontaneous breathing, nonlabored ventilation, respiratory function stable and patient connected to nasal cannula oxygen ?Cardiovascular status: blood pressure returned to baseline and stable ?Postop Assessment: no apparent nausea or vomiting ?Anesthetic complications: no ? ? ?No notable events documented. ? ?Last Vitals:  ?Vitals:  ? 07/11/21 1245 07/11/21 1314  ?BP: 126/87 (!) 142/86  ?Pulse: 98 100  ?Resp: (!) 22 20  ?Temp:  36.7 ?C  ?SpO2: 95% 100%  ?  ?Last Pain:  ?Vitals:  ? 07/12/21 0924  ?TempSrc:   ?PainSc: 2   ? ? ?  ?  ?  ?  ?  ?  ? ?[name] ? ? ? ? ?

## 2021-07-20 ENCOUNTER — Other Ambulatory Visit (HOSPITAL_COMMUNITY): Payer: Self-pay

## 2021-08-13 ENCOUNTER — Other Ambulatory Visit (HOSPITAL_COMMUNITY): Payer: Self-pay

## 2021-08-13 MED ORDER — CIPROFLOXACIN HCL 500 MG PO TABS
ORAL_TABLET | ORAL | 0 refills | Status: DC
Start: 2021-08-13 — End: 2021-09-26
  Filled 2021-08-13: qty 20, 10d supply, fill #0

## 2021-08-31 ENCOUNTER — Other Ambulatory Visit (HOSPITAL_COMMUNITY): Payer: Self-pay

## 2021-09-11 ENCOUNTER — Other Ambulatory Visit (HOSPITAL_COMMUNITY): Payer: Self-pay

## 2021-09-26 ENCOUNTER — Encounter: Payer: Self-pay | Admitting: Primary Care

## 2021-09-26 ENCOUNTER — Ambulatory Visit (INDEPENDENT_AMBULATORY_CARE_PROVIDER_SITE_OTHER): Payer: 59 | Admitting: Primary Care

## 2021-09-26 VITALS — BP 138/84 | HR 123 | Temp 98.8°F | Ht 66.0 in | Wt 317.2 lb

## 2021-09-26 DIAGNOSIS — J342 Deviated nasal septum: Secondary | ICD-10-CM | POA: Diagnosis not present

## 2021-09-26 DIAGNOSIS — R0683 Snoring: Secondary | ICD-10-CM | POA: Diagnosis not present

## 2021-09-26 NOTE — Progress Notes (Signed)
? ?_0  ID: Megan Duran, female    DOB: [birth year], 41 y.o.   MRN: [id number] ? ?Chief Complaint  ?Patient presents with  ? Consult  ? ? ?Referring provider: ?Jerrell Belfast, MD ? ?HPI: ?41 year old female, never smoked.  Past medical history significant for left ventricular hypertrophy without heart failure, hypertension, vitamin D deficiency, gastroparesis, IBS, iron deficiency, morbid obesity. ? ?09/26/2021 ?Patient presents today for sleep consult. Referred by ENT, Dr. Wilburn Cornelia d/t possible OSA. She had septoplasty and turbinate reduction  in March 2023. Her sleep has improved some since surgery but she continues to have snoring symptoms. No significant daytime sleepiness. She has difficulty initiating sleep and trouble staying asleep.  Typical bedtime is between 10 and 11 PM.  It can take her an hour to fall asleep.  She wakes up on average 1 or 2 times a night.  She starts her day at 6:30 AM.  She has two children ages 106 and 29.  She had previously lost approximately 100 pounds with a weight loss program but has since gained that weight back.  She has never had a sleep study.  She does not wear CPAP or oxygen.  Epworth score is 3. ? ?Sleep questionnaire ?Symptoms- loud snoring, trouble staying asleep  ?Prior sleep study- No ?Bedtime- 10-11pm ?Time to fall asleep- about an hour ?Nocturnal awakenings- 1-2 times ?Out of bed/start of day- 6:30am ?Weight changes- up 100lbs  ?Do you operate heavy machinery- No ?Do you currently wear CPAP- No ?Do you current wear oxygen- No ?Epworth- 3 ? ? ?Allergies  ?Allergen Reactions  ? Bactrim [Sulfamethoxazole-Trimethoprim] Hives  ? Biaxin [Clarithromycin] Hives  ? Doxycycline Hives  ? Effexor [Venlafaxine] Hives  ? Erythromycin Hives  ? Imitrex [Sumatriptan] Hives  ? Keflex [Cephalexin] Hives  ? Minocycline Hives  ? ? ?Immunization History  ?Administered Date(s) Administered  ? Hepatitis B 10/12/2006  ? Influenza-Unspecified 02/10/2013, 03/03/2021  ? MMR 05/13/1981   ? PFIZER(Purple Top)SARS-COV-2 Vaccination 06/01/2019, 06/24/2019, 03/24/2020  ? Tdap 05/13/2005, 01/21/2017  ? ? ?Past Medical History:  ?Diagnosis Date  ? Asthma   ? only uses maintanence inhaler  ? Gastroparesis   ? GERD (gastroesophageal reflux disease)   ? takes meds  ? History of MRSA infection   ? IBS (irritable bowel syndrome)   ? Migraine   ? PONV (postoperative nausea and vomiting)   ? ? ?Tobacco History: ?Social History  ? ?Tobacco Use  ?Smoking Status Never  ?Smokeless Tobacco Never  ? ?Counseling given: Not Answered ? ? ?Outpatient Medications Prior to Visit  ?Medication Sig Dispense Refill  ? Budeson-Glycopyrrol-Formoterol (BREZTRI AEROSPHERE) 160-9-4.8 MCG/ACT AERO Inhale 2 puffs into the lungs in the morning and at bedtime. 10.7 g 5  ? cetirizine (ZYRTEC) 10 MG tablet Can take one tablet by mouth once daily if needed. 90 tablet 1  ? diclofenac sodium (VOLTAREN) 1 % GEL Apply 4 g topically 4 (four) times daily. 150 g 1  ? levalbuterol (XOPENEX HFA) 45 MCG/ACT inhaler Inhale 2 puffs into the lungs every 4 to 6 hours as needed for cough or wheeze. 15 g 2  ? levonorgestrel (MIRENA, 52 MG,) 20 MCG/24HR IUD Mirena 20 mcg/24 hours (6 yrs) 52 mg intrauterine device ? Take 1 device by intrauterine route.    ? magnesium 30 MG tablet Take 30 mg by mouth daily.    ? meclizine (ANTIVERT) 12.5 MG tablet Take 1/2- 1 tablet by mouth every 6 hours if needed for vertigo. 45 tablet 1  ? Multiple Vitamin (  MULTIVITAMIN PO) Take by mouth.    ? olmesartan (BENICAR) 20 MG tablet Take 1 tablet by mouth daily. 90 tablet 1  ? Olopatadine-Mometasone (RYALTRIS) [id number] MCG/ACT SUSP Place 2 sprays into the nose 2 (two) times daily. 29 g 5  ? omeprazole (PRILOSEC) 40 MG capsule Take 1 capsule by mouth 2 times per day 60 capsule 5  ? ondansetron (ZOFRAN) 4 MG tablet Use 1-2 tablets every 8 hours as needed for nausea 180 tablet 1  ? VITAMIN D PO Take by mouth daily.    ? ciprofloxacin (CIPRO) 500 MG tablet Take 1 tablet by mouth  twice daily for 10 days. 20 tablet 0  ? HYDROcodone-acetaminophen (NORCO) 5-325 MG tablet Alternate OTC Motrin 629m and Tylenol 6578mevery 6 hours as needed for pain. May substitute this Hydrocodone (Tylenol/narcotic) for plain Tylenol as needed. 20 tablet 0  ? ondansetron (ZOFRAN-ODT) 4 MG disintegrating tablet Dissolve1 tablet (4 mg total) by mouth every 8 (eight) hours as needed for nausea or vomiting. 20 tablet 0  ? ?No facility-administered medications prior to visit.  ? ?Review of Systems ? ?Review of Systems  ?Constitutional: Negative.   ?HENT: Negative.    ?Respiratory: Negative.    ?Cardiovascular: Negative.   ?Psychiatric/Behavioral:  Positive for sleep disturbance.   ? ? ?Physical Exam ? ?BP 138/84 (BP Location: Right Arm, Patient Position: Sitting, Cuff Size: Large)   Pulse (!) 123   Temp 98.8 ?F (37.1 ?C) (Oral)   Ht _0  (1.676 m)   Wt (!) 317 lb 3.2 oz (143.9 kg)   SpO2 97%   BMI 51.20 kg/m?  ?Physical Exam ?Constitutional:   ?   General: She is not in acute distress. ?   Appearance: Normal appearance. She is obese. She is not ill-appearing.  ?HENT:  ?   Head: Normocephalic and atraumatic.  ?   Mouth/Throat:  ?   Mouth: Mucous membranes are moist.  ?   Pharynx: Oropharynx is clear.  ?Cardiovascular:  ?   Rate and Rhythm: Regular rhythm. Tachycardia present.  ?   Comments: Regular, hx tachycardia ?Pulmonary:  ?   Effort: Pulmonary effort is normal.  ?   Breath sounds: Normal breath sounds.  ?   Comments: CTA ?Skin: ?   General: Skin is warm and dry.  ?Neurological:  ?   General: No focal deficit present.  ?   Mental Status: She is alert and oriented to person, place, and time. Mental status is at baseline.  ?Psychiatric:     ?   Mood and Affect: Mood normal.     ?   Behavior: Behavior normal.     ?   Thought Content: Thought content normal.     ?   Judgment: Judgment normal.  ?  ? ?Lab Results: ? ?CBC ?   ?Component Value Date/Time  ? WBC 9.5 12/05/2020 1626  ? RBC 4.22 12/05/2020 1626  ? HGB  13.0 12/05/2020 1626  ? HCT 39.1 12/05/2020 1626  ? PLT 260.0 12/05/2020 1626  ? MCV 92.7 12/05/2020 1626  ? MCH 29.6 04/02/2017 0515  ? MCHC 33.3 12/05/2020 1626  ? RDW 13.2 12/05/2020 1626  ? LYMPHSABS 1.6 12/05/2020 1626  ? MONOABS 0.5 12/05/2020 1626  ? EOSABS 0.1 12/05/2020 1626  ? BASOSABS 0.0 12/05/2020 1626  ? ? ?BMET ?   ?Component Value Date/Time  ? NA 137 12/05/2020 1626  ? K 4.0 12/05/2020 1626  ? CL 101 12/05/2020 1626  ? CO2 29 12/05/2020 1626  ?  GLUCOSE 85 12/05/2020 1626  ? BUN 15 12/05/2020 1626  ? CREATININE 0.73 12/05/2020 1626  ? CALCIUM 9.4 12/05/2020 1626  ? GFRNONAA >60 05/04/2009 1849  ? GFRAA  05/04/2009 1849  ?  >60        ?The eGFR has been calculated ?using the MDRD equation. ?This calculation has not been ?validated in all clinical ?situations. ?eGFR's persistently ?<60 mL/min signify ?possible Chronic Kidney Disease.  ? ? ?BNP ?No results found for: BNP ? ?ProBNP ?No results found for: PROBNP ? ?Imaging: ?No results found. ? ? ?Assessment & Plan:  ? ?Loud snoring ?- Patient has symptoms of loud snoring and trouble staying asleep.  Epworth 3. BMI 51. Concern patient could have obstructive sleep apnea, needs home sleep study to evaluate. Discussed risk of untreated sleep apnea including cardiac arrhythmias, pulm HTN, stroke, DM. We briefly reviewed treatment options. Encouraged patient to work on weight loss efforts and focus on side sleeping position/elevate head of bed. Advised against driving if experiencing excessive daytime sleepiness. Follow-up in 4-6 weeks to review sleep study results and discuss treatment options further. ? ? ?Nasal septal deviation ?- She had septoplasty and turbinate reduction in March 2023 with Dr. Wilburn Cornelia, continues to have snoring symptoms.  ? ? ?Martyn Ehrich, NP ?09/26/2021 ? ?

## 2021-09-26 NOTE — Assessment & Plan Note (Signed)
-   She had septoplasty and turbinate reduction in March 2023 with Dr. Annalee Genta, continues to have snoring symptoms.  ?

## 2021-09-26 NOTE — Patient Instructions (Addendum)
?Sleep apnea is defined as period of 10 seconds or longer when you stop breathing at night. This can happen multiple times a night. Dx sleep apnea is when this occurs more than 5 times an hour.  ?  ?Mild OSA 5-15 apneic events an hour ?Moderate OSA 15-30 apneic events an hour ?Severe OSA > 30 apneic events an hour ?  ?Untreated sleep apnea puts you at higher risk for cardiac arrhythmias, pulmonary HTN, stroke and diabetes ?  ?Treatment options include weight loss, side sleeping position, oral appliance, CPAP therapy or referral to ENT for possible surgical options  ?  ?Recommendations: ?Focus on side sleeping position ?Work on weight loss efforts  ?Do not drive if experiencing excessive daytime sleepiness of fatigue  ?  ?Orders: ?Home sleep study re: snoring  ?  ?Follow-up: ?6 week virtual visit with Beth NP to review sleep study results and treatment if needed  ? ?Sleep Apnea ?Sleep apnea affects breathing during sleep. It causes breathing to stop for 10 seconds or more, or to become shallow. People with sleep apnea usually snore loudly. ?It can also increase the risk of: ?Heart attack. ?Stroke. ?Being very overweight (obese). ?Diabetes. ?Heart failure. ?Irregular heartbeat. ?High blood pressure. ?The goal of treatment is to help you breathe normally again. ?What are the causes? ? ?The most common cause of this condition is a collapsed or blocked airway. ?There are three kinds of sleep apnea: ?Obstructive sleep apnea. This is caused by a blocked or collapsed airway. ?Central sleep apnea. This happens when the brain does not send the right signals to the muscles that control breathing. ?Mixed sleep apnea. This is a combination of obstructive and central sleep apnea. ?What increases the risk? ?Being overweight. ?Smoking. ?Having a small airway. ?Being older. ?Being female. ?Drinking alcohol. ?Taking medicines to calm yourself (sedatives or tranquilizers). ?Having family members with the condition. ?Having a tongue or  tonsils that are larger than normal. ?What are the signs or symptoms? ?Trouble staying asleep. ?Loud snoring. ?Headaches in the morning. ?Waking up gasping. ?Dry mouth or sore throat in the morning. ?Being sleepy or tired during the day. ?If you are sleepy or tired during the day, you may also: ?Not be able to focus your mind (concentrate). ?Forget things. ?Get angry a lot and have mood swings. ?Feel sad (depressed). ?Have changes in your personality. ?Have less interest in sex, if you are female. ?Be unable to have an erection, if you are female. ?How is this treated? ? ?Sleeping on your side. ?Using a medicine to get rid of mucus in your nose (decongestant). ?Avoiding the use of alcohol, medicines to help you relax, or certain pain medicines (narcotics). ?Losing weight, if needed. ?Changing your diet. ?Quitting smoking. ?Using a machine to open your airway while you sleep, such as: ?An oral appliance. This is a mouthpiece that shifts your lower jaw forward. ?A CPAP device. This device blows air through a mask when you breathe out (exhale). ?An EPAP device. This has valves that you put in each nostril. ?A BIPAP device. This device blows air through a mask when you breathe in (inhale) and breathe out. ?Having surgery if other treatments do not work. ?Follow these instructions at home: ?Lifestyle ?Make changes that your doctor recommends. ?Eat a healthy diet. ?Lose weight if needed. ?Avoid alcohol, medicines to help you relax, and some pain medicines. ?Do not smoke or use any products that contain nicotine or tobacco. If you need help quitting, ask your doctor. ?General instructions ?Take over-the-counter  and prescription medicines only as told by your doctor. ?If you were given a machine to use while you sleep, use it only as told by your doctor. ?If you are having surgery, make sure to tell your doctor you have sleep apnea. You may need to bring your device with you. ?Keep all follow-up visits. ?Contact a doctor  if: ?The machine that you were given to use during sleep bothers you or does not seem to be working. ?You do not get better. ?You get worse. ?Get help right away if: ?Your chest hurts. ?You have trouble breathing in enough air. ?You have an uncomfortable feeling in your back, arms, or stomach. ?You have trouble talking. ?One side of your body feels weak. ?A part of your face is hanging down. ?These symptoms may be an emergency. Get help right away. Call your local emergency services (911 in the [place] ?Do not wait to see if the symptoms will go away. ?Do not drive yourself to the hospital. ?Summary ?This condition affects breathing during sleep. ?The most common cause is a collapsed or blocked airway. ?The goal of treatment is to help you breathe normally while you sleep. ?This information is not intended to replace advice given to you by your health care provider. Make sure you discuss any questions you have with your health care provider. ?Document Revised: 12/06/2020 Document Reviewed: 04/07/2020 ?Elsevier Patient Education ? 2023 [employer] ? ?

## 2021-09-26 NOTE — Progress Notes (Signed)
Reviewed and agree with assessment/plan. ? ? ?Coralyn Helling, MD ?[facility]monary/Critical [facility] ?09/26/2021, 12:59 PM ?Pager:  [phone] ? ?

## 2021-09-26 NOTE — Assessment & Plan Note (Addendum)
-   Patient has symptoms of loud snoring and trouble staying asleep.  Epworth 3. BMI 51. Concern patient could have obstructive sleep apnea, needs home sleep study to evaluate. Discussed risk of untreated sleep apnea including cardiac arrhythmias, pulm HTN, stroke, DM. We briefly reviewed treatment options. Encouraged patient to work on weight loss efforts and focus on side sleeping position/elevate head of bed. Advised against driving if experiencing excessive daytime sleepiness. Follow-up in 4-6 weeks to review sleep study results and discuss treatment options further. ? ?

## 2021-10-11 ENCOUNTER — Other Ambulatory Visit: Payer: Self-pay

## 2021-10-11 ENCOUNTER — Telehealth: Payer: 59 | Admitting: Physician Assistant

## 2021-10-11 ENCOUNTER — Other Ambulatory Visit (HOSPITAL_COMMUNITY): Payer: Self-pay

## 2021-10-11 DIAGNOSIS — J209 Acute bronchitis, unspecified: Secondary | ICD-10-CM | POA: Diagnosis not present

## 2021-10-11 MED ORDER — RYALTRIS 665-25 MCG/ACT NA SUSP: 2.0000 | Freq: Two times a day (BID) | NASAL | 0 refills | Status: DC

## 2021-10-11 MED ORDER — BENZONATATE 100 MG PO CAPS
100.0000 mg | ORAL_CAPSULE | Freq: Three times a day (TID) | ORAL | 0 refills | Status: DC | PRN
  Filled 2021-10-11: qty 30, 10d supply, fill #0

## 2021-10-11 MED ORDER — PREDNISONE 20 MG PO TABS
40.0000 mg | ORAL_TABLET | Freq: Every day | ORAL | 0 refills | Status: DC
  Filled 2021-10-11: qty 10, 5d supply, fill #0

## 2021-10-11 NOTE — Progress Notes (Signed)
We are sorry that you are not feeling well.  Here is how we plan to help!  Based on your presentation I believe you most likely have A cough due to a virus.  This is called viral bronchitis and is best treated by rest, plenty of fluids and control of the cough.  You may use Ibuprofen or Tylenol as directed to help your symptoms.     In addition you may use A prescription cough medication called Tessalon Perles 100mg . You may take 1-2 capsules every 8 hours as needed for your cough. I have also sent in a 5-day burst of prednisone to take as directed to open up the lungs and to calm down spasms in the airway that is contributing to cough.   From your responses in the eVisit questionnaire you describe inflammation in the upper respiratory tract which is causing a significant cough.  This is commonly called Bronchitis and has four common causes:   Allergies Viral Infections Acid Reflux Bacterial Infection Allergies, viruses and acid reflux are treated by controlling symptoms or eliminating the cause. An example might be a cough caused by taking certain blood pressure medications. You stop the cough by changing the medication. Another example might be a cough caused by acid reflux. Controlling the reflux helps control the cough.  USE OF BRONCHODILATOR ("RESCUE") INHALERS: There is a risk from using your bronchodilator too frequently.  The risk is that over-reliance on a medication which only relaxes the muscles surrounding the breathing tubes can reduce the effectiveness of medications prescribed to reduce swelling and congestion of the tubes themselves.  Although you feel brief relief from the bronchodilator inhaler, your asthma may actually be worsening with the tubes becoming more swollen and filled with mucus.  This can delay other crucial treatments, such as oral steroid medications. If you need to use a bronchodilator inhaler daily, several times per day, you should discuss this with your provider.   There are probably better treatments that could be used to keep your asthma under control.     HOME CARE Only take medications as instructed by your medical team. Complete the entire course of an antibiotic. Drink plenty of fluids and get plenty of rest. Avoid close contacts especially the very young and the elderly Cover your mouth if you cough or cough into your sleeve. Always remember to wash your hands A steam or ultrasonic humidifier can help congestion.   GET HELP RIGHT AWAY IF: You develop worsening fever. You become short of breath You cough up blood. Your symptoms persist after you have completed your treatment plan MAKE SURE YOU  Understand these instructions. Will watch your condition. Will get help right away if you are not doing well or get worse.    Thank you for choosing an e-visit.  Your e-visit answers were reviewed by a board certified advanced clinical practitioner to complete your personal care plan. Depending upon the condition, your plan could have included both over the counter or prescription medications.  Please review your pharmacy choice. Make sure the pharmacy is open so you can pick up prescription now. If there is a problem, you may contact your provider through and have the prescription routed to another pharmacy.  Your safety is important to [employer]. If you have drug allergies check your prescription carefully.   For the next 24 hours you can use MyChart to ask questions about today's visit, request a non-urgent call back, or ask for a work or school excuse.  You will get an email in the next two days asking about your experience. I hope that your e-visit has been valuable and will speed your recovery.  

## 2021-10-11 NOTE — Progress Notes (Signed)
I have spent 5 minutes in review of e-visit questionnaire, review and updating patient chart, medical decision making and response to patient.   Umer Harig Cody Edith Groleau, PA-C    

## 2021-10-15 ENCOUNTER — Other Ambulatory Visit: Payer: Self-pay | Admitting: Allergy and Immunology

## 2021-10-16 ENCOUNTER — Telehealth: Payer: 59 | Admitting: Physician Assistant

## 2021-10-16 ENCOUNTER — Other Ambulatory Visit (HOSPITAL_COMMUNITY): Payer: Self-pay

## 2021-10-16 DIAGNOSIS — B9689 Other specified bacterial agents as the cause of diseases classified elsewhere: Secondary | ICD-10-CM

## 2021-10-16 MED ORDER — BREZTRI AEROSPHERE 160-9-4.8 MCG/ACT IN AERO
2.0000 | INHALATION_SPRAY | Freq: Two times a day (BID) | RESPIRATORY_TRACT | 5 refills | Status: DC
  Filled 2021-10-16: qty 10.7, 30d supply, fill #0
  Filled 2021-11-10: qty 10.7, 30d supply, fill #1
  Filled 2021-12-12: qty 10.7, 30d supply, fill #2
  Filled 2022-01-07: qty 10.7, 30d supply, fill #3
  Filled 2022-02-04: qty 10.7, 30d supply, fill #4
  Filled 2022-03-05: qty 10.7, 30d supply, fill #5

## 2021-10-16 MED ORDER — AMOXICILLIN-POT CLAVULANATE 875-125 MG PO TABS
1.0000 | ORAL_TABLET | Freq: Two times a day (BID) | ORAL | 0 refills | Status: DC
Start: 2021-10-16 — End: 2021-11-12
  Filled 2021-10-16: qty 14, 7d supply, fill #0

## 2021-10-16 MED ORDER — OMEPRAZOLE 40 MG PO CPDR
DELAYED_RELEASE_CAPSULE | ORAL | 5 refills | Status: DC
  Filled 2021-10-16: qty 60, 30d supply, fill #0
  Filled 2021-11-10: qty 60, 30d supply, fill #1
  Filled 2021-12-12: qty 60, 30d supply, fill #2
  Filled 2022-01-07: qty 60, 30d supply, fill #3
  Filled 2022-02-04: qty 60, 30d supply, fill #4
  Filled 2022-03-05: qty 60, 30d supply, fill #5

## 2021-10-16 MED ORDER — ONDANSETRON 4 MG PO TBDP
4.0000 mg | ORAL_TABLET | Freq: Three times a day (TID) | ORAL | 0 refills | Status: DC | PRN
  Filled 2021-10-16: qty 20, 7d supply, fill #0

## 2021-10-16 NOTE — Progress Notes (Signed)
I have spent 5 minutes in review of e-visit questionnaire, review and updating patient chart, medical decision making and response to patient.   Kimbley Sprague Cody Carah Barrientes, PA-C    

## 2021-10-16 NOTE — Progress Notes (Signed)
We are sorry that you are not feeling well.  Here is how we plan to help!  Based on your presentation I believe you most likely have A cough due to bacteria.  When patients have a fever and a productive cough with a change in color or increased sputum production, we are concerned about bacterial bronchitis.  If left untreated it can progress to pneumonia.  If your symptoms do not improve with your treatment plan it is important that you contact your provider.   I have prescribed Azithromyin 250 mg: two tablets now and then one tablet daily for 4 additonal days    I have sent in Zofran to have on hand. Please continue other measures recommended at time of last visit.   From your responses in the eVisit questionnaire you describe inflammation in the upper respiratory tract which is causing a significant cough.  This is commonly called Bronchitis and has four common causes:   Allergies Viral Infections Acid Reflux Bacterial Infection Allergies, viruses and acid reflux are treated by controlling symptoms or eliminating the cause. An example might be a cough caused by taking certain blood pressure medications. You stop the cough by changing the medication. Another example might be a cough caused by acid reflux. Controlling the reflux helps control the cough.  USE OF BRONCHODILATOR ("RESCUE") INHALERS: There is a risk from using your bronchodilator too frequently.  The risk is that over-reliance on a medication which only relaxes the muscles surrounding the breathing tubes can reduce the effectiveness of medications prescribed to reduce swelling and congestion of the tubes themselves.  Although you feel brief relief from the bronchodilator inhaler, your asthma may actually be worsening with the tubes becoming more swollen and filled with mucus.  This can delay other crucial treatments, such as oral steroid medications. If you need to use a bronchodilator inhaler daily, several times per day, you should  discuss this with your provider.  There are probably better treatments that could be used to keep your asthma under control.     HOME CARE Only take medications as instructed by your medical team. Complete the entire course of an antibiotic. Drink plenty of fluids and get plenty of rest. Avoid close contacts especially the very young and the elderly Cover your mouth if you cough or cough into your sleeve. Always remember to wash your hands A steam or ultrasonic humidifier can help congestion.   GET HELP RIGHT AWAY IF: You develop worsening fever. You become short of breath You cough up blood. Your symptoms persist after you have completed your treatment plan MAKE SURE YOU  Understand these instructions. Will watch your condition. Will get help right away if you are not doing well or get worse.    Thank you for choosing an e-visit.  Your e-visit answers were reviewed by a board certified advanced clinical practitioner to complete your personal care plan. Depending upon the condition, your plan could have included both over the counter or prescription medications.  Please review your pharmacy choice. Make sure the pharmacy is open so you can pick up prescription now. If there is a problem, you may contact your provider through [employer] and have the prescription routed to another pharmacy.  Your safety is important to [place]. If you have drug allergies check your prescription carefully.   For the next 24 hours you can use MyChart to ask questions about today's visit, request a non-urgent call back, or ask for a work or school excuse. You  will get an email in the next two days asking about your experience. I hope that your e-visit has been valuable and will speed your recovery.

## 2021-10-18 ENCOUNTER — Other Ambulatory Visit (HOSPITAL_COMMUNITY): Payer: Self-pay

## 2021-10-24 DIAGNOSIS — B029 Zoster without complications: Secondary | ICD-10-CM | POA: Diagnosis not present

## 2021-10-24 DIAGNOSIS — R21 Rash and other nonspecific skin eruption: Secondary | ICD-10-CM | POA: Diagnosis not present

## 2021-10-29 ENCOUNTER — Encounter: Payer: Self-pay | Admitting: Internal Medicine

## 2021-10-29 DIAGNOSIS — J029 Acute pharyngitis, unspecified: Secondary | ICD-10-CM | POA: Diagnosis not present

## 2021-11-10 ENCOUNTER — Other Ambulatory Visit (HOSPITAL_COMMUNITY): Payer: Self-pay

## 2021-11-12 ENCOUNTER — Encounter: Payer: Self-pay | Admitting: Internal Medicine

## 2021-11-12 ENCOUNTER — Other Ambulatory Visit: Payer: Self-pay | Admitting: Internal Medicine

## 2021-11-12 DIAGNOSIS — B0229 Other postherpetic nervous system involvement: Secondary | ICD-10-CM

## 2021-11-13 ENCOUNTER — Other Ambulatory Visit: Payer: Self-pay | Admitting: Internal Medicine

## 2021-11-13 DIAGNOSIS — B0229 Other postherpetic nervous system involvement: Secondary | ICD-10-CM

## 2021-11-13 MED ORDER — GABAPENTIN 400 MG PO CAPS
400.0000 mg | ORAL_CAPSULE | Freq: Three times a day (TID) | ORAL | 0 refills | Status: DC
Start: 2021-11-13 — End: 2022-01-26
  Filled 2021-11-13: qty 30, 10d supply, fill #0

## 2021-11-14 ENCOUNTER — Other Ambulatory Visit (HOSPITAL_COMMUNITY): Payer: Self-pay

## 2021-11-15 ENCOUNTER — Other Ambulatory Visit (HOSPITAL_COMMUNITY): Payer: Self-pay

## 2021-11-16 NOTE — Telephone Encounter (Signed)
Please see the MyChart message reply(ies) for my assessment and plan.  The patient gave consent for this Medical Advice Message and is aware that it may result in a bill to their insurance company as well as the possibility that this may result in a co-payment or deductible. They are an established patient, but are not seeking medical advice exclusively about a problem treated during an in person or video visit in the last 7 days. I did not recommend an in person or video visit within 7 days of my reply.  I spent a total of 15 minutes cumulative time within 7 days through MyChart messaging Shima Compere, MD  

## 2021-11-19 DIAGNOSIS — Z130 Encounter for screening for diseases of the blood and blood-forming organs and certain disorders involving the immune mechanism: Secondary | ICD-10-CM | POA: Diagnosis not present

## 2021-11-19 DIAGNOSIS — Z1329 Encounter for screening for other suspected endocrine disorder: Secondary | ICD-10-CM | POA: Diagnosis not present

## 2021-11-19 DIAGNOSIS — Z13220 Encounter for screening for lipoid disorders: Secondary | ICD-10-CM | POA: Diagnosis not present

## 2021-11-19 DIAGNOSIS — Z124 Encounter for screening for malignant neoplasm of cervix: Secondary | ICD-10-CM | POA: Diagnosis not present

## 2021-11-19 DIAGNOSIS — Z01419 Encounter for gynecological examination (general) (routine) without abnormal findings: Secondary | ICD-10-CM | POA: Diagnosis not present

## 2021-11-19 LAB — LIPID PANEL
Cholesterol: 288 — AB (ref 0–200)
HDL: 51 (ref 35–70)
LDL Cholesterol: 216
Triglycerides: 118 (ref 40–160)

## 2021-11-19 LAB — CBC AND DIFFERENTIAL
HCT: 36 (ref 36–46)
Hemoglobin: 12 (ref 12.0–16.0)
Platelets: 308 10*3/uL (ref 150–400)
WBC: 11.2

## 2021-11-19 LAB — BASIC METABOLIC PANEL
BUN: 22 — AB (ref 4–21)
CO2: 24 — AB (ref 13–22)
Chloride: 99 (ref 99–108)
Creatinine: 0.8 (ref <=1.1)
Glucose: 91
Potassium: 4.6 mEq/L (ref 3.5–5.1)
Sodium: 138 (ref 137–147)

## 2021-11-19 LAB — HEMOGLOBIN A1C: Hemoglobin A1C: 6.4

## 2021-11-19 LAB — TSH: TSH: 0.49 (ref 0.41–5.90)

## 2021-11-19 LAB — VITAMIN D 25 HYDROXY (VIT D DEFICIENCY, FRACTURES): Vit D, 25-Hydroxy: 39.7

## 2021-11-19 LAB — COMPREHENSIVE METABOLIC PANEL: Calcium: 9.6 (ref 8.7–10.7)

## 2021-11-21 LAB — HM PAP SMEAR

## 2021-11-26 ENCOUNTER — Other Ambulatory Visit (HOSPITAL_COMMUNITY): Payer: Self-pay

## 2021-11-26 ENCOUNTER — Ambulatory Visit: Payer: 59 | Admitting: Internal Medicine

## 2021-11-26 ENCOUNTER — Encounter: Payer: Self-pay | Admitting: Internal Medicine

## 2021-11-26 VITALS — BP 142/94 | HR 90 | Temp 98.1°F | Resp 16 | Ht 66.0 in | Wt 318.0 lb

## 2021-11-26 DIAGNOSIS — E119 Type 2 diabetes mellitus without complications: Secondary | ICD-10-CM | POA: Insufficient documentation

## 2021-11-26 DIAGNOSIS — R0602 Shortness of breath: Secondary | ICD-10-CM

## 2021-11-26 DIAGNOSIS — Z23 Encounter for immunization: Secondary | ICD-10-CM | POA: Diagnosis not present

## 2021-11-26 DIAGNOSIS — E611 Iron deficiency: Secondary | ICD-10-CM

## 2021-11-26 DIAGNOSIS — E1169 Type 2 diabetes mellitus with other specified complication: Secondary | ICD-10-CM | POA: Diagnosis not present

## 2021-11-26 DIAGNOSIS — I119 Hypertensive heart disease without heart failure: Secondary | ICD-10-CM | POA: Diagnosis not present

## 2021-11-26 DIAGNOSIS — I10 Essential (primary) hypertension: Secondary | ICD-10-CM | POA: Diagnosis not present

## 2021-11-26 DIAGNOSIS — E559 Vitamin D deficiency, unspecified: Secondary | ICD-10-CM

## 2021-11-26 MED ORDER — NEBIVOLOL HCL 5 MG PO TABS
5.0000 mg | ORAL_TABLET | Freq: Every day | ORAL | 1 refills | Status: DC
  Filled 2021-11-26: qty 90, 90d supply, fill #0
  Filled 2022-02-12: qty 90, 90d supply, fill #1

## 2021-11-26 MED ORDER — MOUNJARO 2.5 MG/0.5ML ~~LOC~~ SOAJ
2.5000 mg | SUBCUTANEOUS | 0 refills | Status: DC
Start: 2021-11-26 — End: 2021-11-29
  Filled 2021-11-26 (×2): qty 2, 28d supply, fill #0

## 2021-11-26 NOTE — Progress Notes (Unsigned)
Subjective:  Patient ID: Megan Duran, female    DOB: [birth year]  Age: 41 y.o. MRN: [id number]  CC: Hypertension and Diabetes   HPI Megan Duran presents for f/up -   She recently had labs done elsewhere.  She is recovering from a recent episodes of shingles over her left flank.  She is controlling the postherpetic neuralgia with gabapentin.  She complains of a several month history of elevated heart rate and SOB but denies palpitations, dizziness, lightheadedness, chest pain, shortness of breath, or edema.  Outpatient Medications Prior to Visit  Medication Sig Dispense Refill   Budeson-Glycopyrrol-Formoterol (BREZTRI AEROSPHERE) 160-9-4.8 MCG/ACT AERO Inhale 2 puffs into the lungs in the morning and at bedtime. 10.7 g 5   cetirizine (ZYRTEC) 10 MG tablet Can take one tablet by mouth once daily if needed. 90 tablet 1   diclofenac sodium (VOLTAREN) 1 % GEL Apply 4 g topically 4 (four) times daily. 150 g 1   Fluticasone Propionate (FLONASE ALLERGY RELIEF NA) Place into the nose.     gabapentin (NEURONTIN) 400 MG capsule Take 1 capsule (400 mg total) by mouth 3 (three) times daily. 30 capsule 0   levalbuterol (XOPENEX HFA) 45 MCG/ACT inhaler Inhale 2 puffs into the lungs every 4 to 6 hours as needed for cough or wheeze. 15 g 2   levonorgestrel (MIRENA, 52 MG,) 20 MCG/24HR IUD Mirena 20 mcg/24 hours (6 yrs) 52 mg intrauterine device  Take 1 device by intrauterine route.     magnesium 30 MG tablet Take 30 mg by mouth daily.     meclizine (ANTIVERT) 12.5 MG tablet Take 1/2- 1 tablet by mouth every 6 hours if needed for vertigo. 45 tablet 1   Multiple Vitamin (MULTIVITAMIN PO) Take by mouth.     olmesartan (BENICAR) 20 MG tablet Take 1 tablet by mouth daily. 90 tablet 1   omeprazole (PRILOSEC) 40 MG capsule Take 1 capsule by mouth 2 times per day 60 capsule 5   VITAMIN D PO Take by mouth daily.     Olopatadine-Mometasone (RYALTRIS) [id number] MCG/ACT SUSP Place 2 sprays into the nose 2  (two) times daily. 29 g 0   No facility-administered medications prior to visit.    ROS Review of Systems  Constitutional:  Positive for unexpected weight change (wt gain). Negative for chills, diaphoresis and fatigue.  Respiratory:  Positive for shortness of breath.   Cardiovascular:  Negative for chest pain, palpitations and leg swelling.  Gastrointestinal:  Negative for abdominal pain, constipation, diarrhea and vomiting.  Genitourinary: Negative.  Negative for decreased urine volume, difficulty urinating and urgency.  Musculoskeletal: Negative.   Skin: Negative.   Neurological:  Negative for dizziness and weakness.  Hematological:  Negative for adenopathy. Does not bruise/bleed easily.  Psychiatric/Behavioral: Negative.      Objective:  BP (!) 142/94 (BP Location: Left Arm, Patient Position: Sitting, Cuff Size: Large)   Pulse 90   Temp 98.1 F (36.7 C) (Oral)   Resp 16   Ht 5\' 6"  (1.676 m)   Wt (!) 318 lb (144.2 kg)   SpO2 97%   BMI 51.33 kg/m   BP Readings from Last 3 Encounters:  11/26/21 (!) 142/94  09/26/21 138/84  07/11/21 (!) 142/86    Wt Readings from Last 3 Encounters:  11/26/21 (!) 318 lb (144.2 kg)  09/26/21 (!) 317 lb 3.2 oz (143.9 kg)  07/11/21 (!) 305 lb 5.4 oz (138.5 kg)    Physical Exam Cardiovascular:     Rate  and Rhythm: Normal rate and regular rhythm.     Heart sounds: Normal heart sounds, S1 normal and S2 normal.     Comments: EKG- NSR, 93 bpm Normal EKG Musculoskeletal:     Right lower leg: No edema.     Left lower leg: No edema.     Lab Results  Component Value Date   WBC 11.2 11/19/2021   HGB 12.0 11/19/2021   HCT 36 11/19/2021   PLT 308 11/19/2021   GLUCOSE 85 12/05/2020   CHOL 288 (A) 11/19/2021   TRIG 118 11/19/2021   HDL 51 11/19/2021   LDLCALC 216 11/19/2021   ALT 25 12/05/2020   AST 18 12/05/2020   NA 138 11/19/2021   K 4.6 11/19/2021   CL 99 11/19/2021   CREATININE 0.8 11/19/2021   BUN 22 (A) 11/19/2021   CO2  24 (A) 11/19/2021   TSH 0.49 11/19/2021   HGBA1C 6.4 11/19/2021    No results found.  Assessment & Plan:   Megan Duran was seen today for hypertension and diabetes.  Diagnoses and all orders for this visit:  Primary hypertension- Will start a BB. -     EKG 12-Lead -     nebivolol (BYSTOLIC) 5 MG tablet; Take 1 tablet (5 mg total) by mouth daily.  LVH (left ventricular hypertrophy) due to hypertensive disease, without heart failure- -     nebivolol (BYSTOLIC) 5 MG tablet; Take 1 tablet (5 mg total) by mouth daily.  Vitamin D deficiency  Iron deficiency  Need for vaccination -     Pneumococcal conjugate vaccine 20-valent  Type 2 diabetes mellitus with other specified complication, without long-term current use of insulin (HCC) -     Discontinue: tirzepatide (MOUNJARO) 2.5 MG/0.5ML Pen; Inject 1 pen (2.5 mg) into the skin once a week.  SOB (shortness of breath- Her d-dimer is negative. -     D-dimer, quantitative; Future   I have discontinued Megan Duran's Ryaltris. I am also having her start on nebivolol. Additionally, I am having her maintain her VITAMIN D PO, cetirizine, Multiple Vitamin (MULTIVITAMIN PO), diclofenac sodium, Mirena (52 MG), magnesium, levalbuterol, meclizine, olmesartan, omeprazole, Breztri Aerosphere, gabapentin, and Fluticasone Propionate (FLONASE ALLERGY RELIEF NA).  Meds ordered this encounter  Medications   nebivolol (BYSTOLIC) 5 MG tablet    Sig: Take 1 tablet (5 mg total) by mouth daily.    Dispense:  90 tablet    Refill:  1   DISCONTD: tirzepatide (MOUNJARO) 2.5 MG/0.5ML Pen    Sig: Inject 1 pen (2.5 mg) into the skin once a week.    Dispense:  2 mL    Refill:  0     Follow-up: No follow-ups on file.  Sanda Linger, MD

## 2021-11-27 ENCOUNTER — Encounter: Payer: Self-pay | Admitting: Internal Medicine

## 2021-11-27 NOTE — Telephone Encounter (Signed)
Rec'd fax for PA on Mounjaro 2.5/0.35ml. Submitted PA via cover-my-meds w/ (Key: [id number]). PA has been sent to [employer]../l,mb

## 2021-11-28 ENCOUNTER — Other Ambulatory Visit (HOSPITAL_COMMUNITY): Payer: Self-pay

## 2021-11-28 NOTE — Telephone Encounter (Signed)
Key: [id number]

## 2021-11-29 ENCOUNTER — Other Ambulatory Visit: Payer: Self-pay | Admitting: Internal Medicine

## 2021-11-29 NOTE — Telephone Encounter (Signed)
PA denied.  Pt did not meet the criteria.  Determination sent to scan.

## 2021-11-30 ENCOUNTER — Encounter: Payer: Self-pay | Admitting: Internal Medicine

## 2021-11-30 ENCOUNTER — Other Ambulatory Visit: Payer: Self-pay | Admitting: Internal Medicine

## 2021-11-30 ENCOUNTER — Other Ambulatory Visit: Payer: 59

## 2021-11-30 ENCOUNTER — Other Ambulatory Visit (HOSPITAL_COMMUNITY): Payer: Self-pay

## 2021-11-30 DIAGNOSIS — R0602 Shortness of breath: Secondary | ICD-10-CM | POA: Insufficient documentation

## 2021-11-30 DIAGNOSIS — E1169 Type 2 diabetes mellitus with other specified complication: Secondary | ICD-10-CM

## 2021-11-30 LAB — D-DIMER, QUANTITATIVE: D-Dimer, Quant: 0.24 mcg/mL FEU (ref <0.50)

## 2021-11-30 MED ORDER — RYBELSUS 3 MG PO TABS
3.0000 mg | ORAL_TABLET | Freq: Every day | ORAL | 0 refills | Status: DC
  Filled 2021-11-30: qty 30, 30d supply, fill #0

## 2021-11-30 NOTE — Telephone Encounter (Signed)
PA for Rybelsus initiated Key: [id number]

## 2021-12-01 ENCOUNTER — Encounter: Payer: Self-pay | Admitting: Internal Medicine

## 2021-12-03 ENCOUNTER — Other Ambulatory Visit (HOSPITAL_COMMUNITY): Payer: Self-pay

## 2021-12-03 ENCOUNTER — Other Ambulatory Visit: Payer: Self-pay | Admitting: Internal Medicine

## 2021-12-03 DIAGNOSIS — I119 Hypertensive heart disease without heart failure: Secondary | ICD-10-CM

## 2021-12-03 DIAGNOSIS — I10 Essential (primary) hypertension: Secondary | ICD-10-CM

## 2021-12-03 MED ORDER — OLMESARTAN MEDOXOMIL 20 MG PO TABS
20.0000 mg | ORAL_TABLET | Freq: Every day | ORAL | 1 refills | Status: DC
  Filled 2021-12-03: qty 90, 90d supply, fill #0
  Filled 2022-03-05: qty 90, 90d supply, fill #1

## 2021-12-04 ENCOUNTER — Ambulatory Visit: Payer: 59

## 2021-12-04 DIAGNOSIS — R0683 Snoring: Secondary | ICD-10-CM

## 2021-12-10 ENCOUNTER — Ambulatory Visit: Payer: 59

## 2021-12-10 DIAGNOSIS — G4733 Obstructive sleep apnea (adult) (pediatric): Secondary | ICD-10-CM | POA: Diagnosis not present

## 2021-12-12 ENCOUNTER — Other Ambulatory Visit (HOSPITAL_COMMUNITY): Payer: Self-pay

## 2021-12-13 ENCOUNTER — Ambulatory Visit: Payer: 59 | Admitting: Internal Medicine

## 2021-12-19 DIAGNOSIS — G4733 Obstructive sleep apnea (adult) (pediatric): Secondary | ICD-10-CM | POA: Diagnosis not present

## 2021-12-20 ENCOUNTER — Telehealth: Payer: Self-pay | Admitting: Primary Care

## 2021-12-20 NOTE — Telephone Encounter (Signed)
Called and spoke with pt letting her know results of HST and recs per BW and she verbalized understanding. Visit scheduled for pt with BW. Nothing further needed.

## 2021-12-20 NOTE — Telephone Encounter (Signed)
Please let patient know home sleep study from 12/10/2021 showed evidence of severe obstructive sleep apnea, AHI 41.7/hr. please set patient up with virtual visit to discuss sleep study results in greater detail and treatment options.

## 2021-12-25 ENCOUNTER — Other Ambulatory Visit (HOSPITAL_COMMUNITY): Payer: Self-pay

## 2021-12-25 ENCOUNTER — Telehealth: Payer: 59 | Admitting: Primary Care

## 2021-12-25 DIAGNOSIS — G4730 Sleep apnea, unspecified: Secondary | ICD-10-CM

## 2021-12-25 MED ORDER — TRAZODONE HCL 50 MG PO TABS
ORAL_TABLET | ORAL | 0 refills | Status: DC
  Filled 2021-12-25: qty 45, 22d supply, fill #0

## 2021-12-25 NOTE — Progress Notes (Signed)
Virtual Visit via Video Note  I connected with Megan Duran on 12/25/21 at  3:30 PM EDT by a video enabled telemedicine application and verified that I am speaking with the correct person using two identifiers.  Location: Patient: Home Provider: Office    I discussed the limitations of evaluation and management by telemedicine and the availability of in person appointments. The patient expressed understanding and agreed to proceed.  History of Present Illness: 41 year old female, never smoked.  Past medical history significant for left ventricular hypertrophy without heart failure, hypertension, vitamin D deficiency, gastroparesis, IBS, iron deficiency, morbid obesity.  Previous [facility] pulmonary encounters:  09/26/2021 Patient presents today for sleep consult. Referred by ENT, Dr. Annalee Genta d/t possible OSA. She had septoplasty and turbinate reduction  in March 2023. Her sleep has improved some since surgery but she continues to have snoring symptoms. No significant daytime sleepiness. She has difficulty initiating sleep and trouble staying asleep.  Typical bedtime is between 10 and 11 PM.  It can take her an hour to fall asleep.  She wakes up on average 1 or 2 times a night.  She starts her day at 6:30 AM.  She has two children ages 46 and 47.  She had previously lost approximately 100 pounds with a weight loss program but has since gained that weight back.  She has never had a sleep study.  She does not wear CPAP or oxygen.  Epworth score is 3.  Sleep questionnaire Symptoms- loud snoring, trouble staying asleep  Prior sleep study- No Bedtime- 10-11pm Time to fall asleep- about an hour Nocturnal awakenings- 1-2 times Out of bed/start of day- 6:30am Weight changes- up 100lbs  Do you operate heavy machinery- No Do you currently wear CPAP- No Do you current wear oxygen- No Epworth- 3  12/25/2021- Interim hx  Patient contacted today for virtual video visit to review sleep study results. She  has symptoms of snoring and insomnia. She had a home sleep study on 12/10/2021 that showed evidence of severe obstructive sleep apnea, AHI 41.7/hour. Reviewed sleep study results patient patient and treatment options, recommending she be started on CPAP d/t severity of slepe apnea and patient in agreement with plan. She has trouble falling and staying asleep.    Observations/Objective:  - Appear well. Able to speak in full sentences. No overt resp symptoms   Assessment and Plan:  Severe OSA - HST 12/10/21 >> AHI 41.7/hour - Starting auto CPAP 5-20cm h20 with mask of choice, heated humidity - Advised patient aim to wear CPAP every night for 4-6 hours or longer - Encourage weight loss efforts, side sleeping position  - FU 31-90 days after starting CPAP for compliance check   Insomnia - Difficulty fallings and staying asleep - Starting trazodone 50-100mg  at bedtime as needed for insomnia   Follow Up Instructions:    I discussed the assessment and treatment plan with the patient. The patient was provided an opportunity to ask questions and all were answered. The patient agreed with the plan and demonstrated an understanding of the instructions.   The patient was advised to call back or seek an in-person evaluation if the symptoms worsen or if the condition fails to improve as anticipated.  I provided 22 minutes of non-face-to-face time during this encounter.   Glenford Bayley, NP

## 2021-12-26 ENCOUNTER — Other Ambulatory Visit (HOSPITAL_COMMUNITY): Payer: Self-pay

## 2021-12-27 ENCOUNTER — Encounter: Payer: Self-pay | Admitting: Internal Medicine

## 2021-12-27 ENCOUNTER — Other Ambulatory Visit (HOSPITAL_COMMUNITY): Payer: Self-pay

## 2021-12-27 ENCOUNTER — Other Ambulatory Visit: Payer: Self-pay | Admitting: Internal Medicine

## 2021-12-27 DIAGNOSIS — E1169 Type 2 diabetes mellitus with other specified complication: Secondary | ICD-10-CM

## 2021-12-27 MED ORDER — RYBELSUS 7 MG PO TABS
7.0000 mg | ORAL_TABLET | Freq: Every day | ORAL | 0 refills | Status: DC
  Filled 2021-12-27: qty 90, 90d supply, fill #0

## 2021-12-28 ENCOUNTER — Other Ambulatory Visit (HOSPITAL_COMMUNITY): Payer: Self-pay

## 2022-01-07 ENCOUNTER — Other Ambulatory Visit: Payer: Self-pay | Admitting: Allergy and Immunology

## 2022-01-08 ENCOUNTER — Other Ambulatory Visit (HOSPITAL_COMMUNITY): Payer: Self-pay

## 2022-01-08 MED ORDER — LEVALBUTEROL TARTRATE 45 MCG/ACT IN AERO
INHALATION_SPRAY | RESPIRATORY_TRACT | 0 refills | Status: DC
  Filled 2022-01-08: qty 15, 17d supply, fill #0

## 2022-01-09 ENCOUNTER — Other Ambulatory Visit (HOSPITAL_COMMUNITY): Payer: Self-pay

## 2022-01-09 MED ORDER — TRAZODONE HCL 50 MG PO TABS
ORAL_TABLET | ORAL | 5 refills | Status: DC
  Filled 2022-01-09: qty 45, fill #0
  Filled 2022-01-12: qty 45, 22d supply, fill #0
  Filled 2022-02-01: qty 45, 22d supply, fill #1
  Filled 2022-02-25: qty 45, 23d supply, fill #2

## 2022-01-09 NOTE — Telephone Encounter (Addendum)
I'm so glad medication has been helpful, I have sent a refill. Make sure she has a follow-up 31-90 days after getting CPAP for compliance check.

## 2022-01-09 NOTE — Telephone Encounter (Signed)
"   I wanted to let you know that the trazadone is definitely helpful. I think that and when I get my CPAP on the September 7th. That will help too. I don't have refills on the Trazadone and I wanted to let you know it was working. I hope you have a great day. "   Please advise. Thanks!

## 2022-01-14 ENCOUNTER — Other Ambulatory Visit (HOSPITAL_COMMUNITY): Payer: Self-pay

## 2022-01-15 ENCOUNTER — Other Ambulatory Visit (HOSPITAL_COMMUNITY): Payer: Self-pay

## 2022-01-17 DIAGNOSIS — G4733 Obstructive sleep apnea (adult) (pediatric): Secondary | ICD-10-CM | POA: Diagnosis not present

## 2022-01-21 ENCOUNTER — Telehealth: Payer: 59 | Admitting: Nurse Practitioner

## 2022-01-21 DIAGNOSIS — R110 Nausea: Secondary | ICD-10-CM | POA: Diagnosis not present

## 2022-01-21 DIAGNOSIS — J0140 Acute pansinusitis, unspecified: Secondary | ICD-10-CM

## 2022-01-21 DIAGNOSIS — R051 Acute cough: Secondary | ICD-10-CM | POA: Diagnosis not present

## 2022-01-21 MED ORDER — ONDANSETRON HCL 4 MG PO TABS: 4.0000 mg | ORAL_TABLET | Freq: Three times a day (TID) | ORAL | 0 refills | Status: DC | PRN

## 2022-01-21 MED ORDER — BENZONATATE 100 MG PO CAPS: 100.0000 mg | ORAL_CAPSULE | Freq: Three times a day (TID) | ORAL | 0 refills | Status: DC | PRN

## 2022-01-21 MED ORDER — AMOXICILLIN-POT CLAVULANATE 875-125 MG PO TABS: 1.0000 | ORAL_TABLET | Freq: Two times a day (BID) | ORAL | 0 refills | Status: AC

## 2022-01-21 NOTE — Addendum Note (Signed)
Addended by: Viviano Simas E on: 01/21/2022 09:22 AM   Modules accepted: Orders

## 2022-01-21 NOTE — Progress Notes (Signed)
Meds ordered this encounter  Medications   amoxicillin-clavulanate (AUGMENTIN) 875-125 MG tablet    Sig: Take 1 tablet by mouth 2 (two) times daily for 7 days. Take with food    Dispense:  14 tablet    Refill:  0   benzonatate (TESSALON) 100 MG capsule    Sig: Take 1 capsule (100 mg total) by mouth 3 (three) times daily as needed.    Dispense:  30 capsule    Refill:  0   ondansetron (ZOFRAN) 4 MG tablet    Sig: Take 1 tablet (4 mg total) by mouth every 8 (eight) hours as needed for nausea or vomiting.    Dispense:  10 tablet    Refill:  0

## 2022-01-21 NOTE — Progress Notes (Signed)
E-Visit for Sinus Problems  We are sorry that you are not feeling well.  Here is how we plan to help!  Based on what you have shared with me it looks like you have sinusitis.  Sinusitis is inflammation and infection in the sinus cavities of the head.  Based on your presentation I believe you most likely have Acute Bacterial Sinusitis.  This is an infection caused by bacteria and is treated with antibiotics. I have prescribed Augmentin 875mg /125mg  one tablet twice daily with food, for 7 days. You may use an oral decongestant such as Mucinex D or if you have glaucoma or high blood pressure use plain Mucinex. Saline nasal spray help and can safely be used as often as needed for congestion.  If you develop worsening sinus pain, fever or notice severe headache and vision changes, or if symptoms are not better after completion of antibiotic, please schedule an appointment with a health care provider.    We can also send in a prescription for benzonatate for your cough.   Meds ordered this encounter  Medications   amoxicillin-clavulanate (AUGMENTIN) 875-125 MG tablet    Sig: Take 1 tablet by mouth 2 (two) times daily for 7 days. Take with food    Dispense:  14 tablet    Refill:  0   benzonatate (TESSALON) 100 MG capsule    Sig: Take 1 capsule (100 mg total) by mouth 3 (three) times daily as needed.    Dispense:  30 capsule    Refill:  0     Sinus infections are not as easily transmitted as other respiratory infection, however we still recommend that you avoid close contact with loved ones, especially the very young and elderly.  Remember to wash your hands thoroughly throughout the day as this is the number one way to prevent the spread of infection!  Home Care: Only take medications as instructed by your medical team. Complete the entire course of an antibiotic. Do not take these medications with alcohol. A steam or ultrasonic humidifier can help congestion.  You can place a towel over your head  and breathe in the steam from hot water coming from a faucet. Avoid close contacts especially the very young and the elderly. Cover your mouth when you cough or sneeze. Always remember to wash your hands.  Get Help Right Away If: You develop worsening fever or sinus pain. You develop a severe head ache or visual changes. Your symptoms persist after you have completed your treatment plan.  Make sure you Understand these instructions. Will watch your condition. Will get help right away if you are not doing well or get worse.  Thank you for choosing an e-visit.  Your e-visit answers were reviewed by a board certified advanced clinical practitioner to complete your personal care plan. Depending upon the condition, your plan could have included both over the counter or prescription medications.  Please review your pharmacy choice. Make sure the pharmacy is open so you can pick up prescription now. If there is a problem, you may contact your provider through and have the prescription routed to another pharmacy.  Your safety is important to [employer]. If you have drug allergies check your prescription carefully.   For the next 24 hours you can use MyChart to ask questions about today's visit, request a non-urgent call back, or ask for a work or school excuse. You will get an email in the next two days asking about your experience. I hope that  your e-visit has been valuable and will speed your recovery.   I spent approximately 5 minutes reviewing the patient's history, current symptoms and coordinating their plan of care today.

## 2022-01-26 ENCOUNTER — Other Ambulatory Visit: Payer: Self-pay | Admitting: Internal Medicine

## 2022-01-26 DIAGNOSIS — B0229 Other postherpetic nervous system involvement: Secondary | ICD-10-CM

## 2022-01-27 ENCOUNTER — Other Ambulatory Visit (HOSPITAL_COMMUNITY): Payer: Self-pay

## 2022-01-27 MED ORDER — GABAPENTIN 400 MG PO CAPS
400.0000 mg | ORAL_CAPSULE | Freq: Three times a day (TID) | ORAL | 0 refills | Status: DC
  Filled 2022-01-27: qty 270, 90d supply, fill #0

## 2022-01-28 ENCOUNTER — Other Ambulatory Visit (HOSPITAL_COMMUNITY): Payer: Self-pay

## 2022-01-28 DIAGNOSIS — G4733 Obstructive sleep apnea (adult) (pediatric): Secondary | ICD-10-CM | POA: Diagnosis not present

## 2022-01-29 ENCOUNTER — Other Ambulatory Visit: Payer: Self-pay | Admitting: Internal Medicine

## 2022-01-29 ENCOUNTER — Other Ambulatory Visit (HOSPITAL_COMMUNITY): Payer: Self-pay

## 2022-01-29 DIAGNOSIS — Z1231 Encounter for screening mammogram for malignant neoplasm of breast: Secondary | ICD-10-CM

## 2022-02-02 ENCOUNTER — Other Ambulatory Visit (HOSPITAL_COMMUNITY): Payer: Self-pay

## 2022-02-04 ENCOUNTER — Other Ambulatory Visit (HOSPITAL_COMMUNITY): Payer: Self-pay

## 2022-02-13 ENCOUNTER — Other Ambulatory Visit (HOSPITAL_COMMUNITY): Payer: Self-pay

## 2022-02-16 DIAGNOSIS — G4733 Obstructive sleep apnea (adult) (pediatric): Secondary | ICD-10-CM | POA: Diagnosis not present

## 2022-02-25 ENCOUNTER — Other Ambulatory Visit: Payer: Self-pay | Admitting: Allergy and Immunology

## 2022-02-25 ENCOUNTER — Other Ambulatory Visit (HOSPITAL_COMMUNITY): Payer: Self-pay

## 2022-02-26 ENCOUNTER — Ambulatory Visit
Admission: RE | Admit: 2022-02-26 | Discharge: 2022-02-26 | Disposition: A | Discharge status: Home / Self Care | Payer: 59 | Source: Ambulatory Visit | Attending: Internal Medicine | Admitting: Internal Medicine

## 2022-02-26 DIAGNOSIS — Z1231 Encounter for screening mammogram for malignant neoplasm of breast: Secondary | ICD-10-CM

## 2022-02-28 ENCOUNTER — Other Ambulatory Visit (HOSPITAL_COMMUNITY): Payer: Self-pay

## 2022-02-28 ENCOUNTER — Ambulatory Visit: Payer: 59 | Admitting: Primary Care

## 2022-02-28 ENCOUNTER — Encounter: Payer: Self-pay | Admitting: Primary Care

## 2022-02-28 VITALS — BP 118/72 | HR 86 | Temp 97.7°F | Ht 66.0 in | Wt 318.2 lb

## 2022-02-28 DIAGNOSIS — G4700 Insomnia, unspecified: Secondary | ICD-10-CM | POA: Diagnosis not present

## 2022-02-28 DIAGNOSIS — G4730 Sleep apnea, unspecified: Secondary | ICD-10-CM

## 2022-02-28 DIAGNOSIS — G4733 Obstructive sleep apnea (adult) (pediatric): Secondary | ICD-10-CM | POA: Diagnosis not present

## 2022-02-28 MED ORDER — TRAZODONE HCL 100 MG PO TABS
100.0000 mg | ORAL_TABLET | Freq: Every evening | ORAL | 5 refills | Status: DC | PRN
  Filled 2022-02-28: qty 30, 30d supply, fill #0
  Filled 2022-04-07: qty 30, 30d supply, fill #1
  Filled 2022-05-01: qty 30, 30d supply, fill #2
  Filled 2022-05-26: qty 30, 30d supply, fill #3
  Filled 2022-06-22: qty 30, 30d supply, fill #4
  Filled 2022-07-24: qty 30, 30d supply, fill #5

## 2022-02-28 NOTE — Assessment & Plan Note (Signed)
-   Home sleep study July 2023 showed severe obstructive sleep apnea, AHI 41.7 an hour.  She was started on auto CPAP back in August 2023.  Patient is to percent compliant with CPAP and reports benefit from use.  Pressure setting 5 to 20 cm H2O (95%-13.4 cm H2O); residual AHI 0.7.  No changes to pressure settings needed today.  Continue to encourage patient wear CPAP every night for 4 to 6 hours or longer.  Continue to encourage weight loss efforts.  Advised against driving experiencing excessive daytime sleepiness fatigue.  Renew CPAP supplies with adapt.  Follow-up in 6 months or sooner if needed.

## 2022-02-28 NOTE — Progress Notes (Signed)
_0  ID: Megan Duran, female    DOB: Mar 05, 1981, 41 y.o.   MRN: [id number]  Chief Complaint  Patient presents with   Follow-up    Wearing CPAP-doing good    Referring provider: Janith Lima, MD  HPI: 41 year old female, never smoked.  Past medical history significant for left ventricular hypertrophy without heart failure, hypertension, vitamin D deficiency, gastroparesis, IBS, iron deficiency, morbid obesity.  Previous [facility] pulmonary encounters:  09/26/2021 Patient presents today for sleep consult. Referred by ENT, Dr. Wilburn Cornelia d/t possible OSA. She had septoplasty and turbinate reduction  in March 2023. Her sleep has improved some since surgery but she continues to have snoring symptoms. No significant daytime sleepiness. She has difficulty initiating sleep and trouble staying asleep.  Typical bedtime is between 10 and 11 PM.  It can take her an hour to fall asleep.  She wakes up on average 1 or 2 times a night.  She starts her day at 6:30 AM.  She has two children ages 84 and 56.  She had previously lost approximately 100 pounds with a weight loss program but has since gained that weight back.  She has never had a sleep study.  She does not wear CPAP or oxygen.  Epworth score is 3.  Sleep questionnaire Symptoms- loud snoring, trouble staying asleep  Prior sleep study- No Bedtime- 10-11pm Time to fall asleep- about an hour Nocturnal awakenings- 1-2 times Out of bed/start of day- 6:30am Weight changes- up 100lbs  Do you operate heavy machinery- No Do you currently wear CPAP- No Do you current wear oxygen- No Epworth- 3  12/25/2021 Patient contacted today for virtual video visit to review sleep study results. She has symptoms of snoring and insomnia. She had a home sleep study on 12/10/2021 that showed evidence of severe obstructive sleep apnea, AHI 41.7/hour. Reviewed sleep study results patient patient and treatment options, recommending she be started on CPAP d/t severity  of slepe apnea and patient in agreement with plan. She has trouble falling and staying asleep.   Severe OSA - HST 12/10/21 >> AHI 41.7/hour - Starting auto CPAP 5-20cm h20 with mask of choice, heated humidity - Advised patient aim to wear CPAP every night for 4-6 hours or longer - Encourage weight loss efforts, side sleeping position  - FU 31-90 days after starting CPAP for compliance check      02/28/2022- Interim hx  Patient presents today for 2-3 month follow-up.  Patient has severe obstructive sleep apnea.  She was started on auto CPAP 5 to 20 cm H2O back in August.  We also started her on trazodone 50 to 100 mg at bedtime as needed for insomnia.  She is doing great, adjusted well to wearing CPAP. She reports CPAP has helped significantly with headaches. She has more energy. 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 She take trazodone 169m at bedtime which has also helped with insomnia. She is getting 8 hours of sleep a night. No residual daytime sleepiness.  Airview download 01/28/2022 - 02/26/2022 30/30 days; 97% greater than 4 hours Average usage 8 hours 13 minutes Pressure 5 to 20 cm H2O (13.4 cm H2O-95%) Air leaks 0.6 L/min (95%) AHI 0.7   Allergies  Allergen Reactions   Bactrim [Sulfamethoxazole-Trimethoprim] Hives   Biaxin [Clarithromycin] Hives   Doxycycline Hives   Effexor [Venlafaxine] Hives   Erythromycin Hives   Imitrex [Sumatriptan] Hives   Keflex [Cephalexin] Hives   Minocycline Hives  Immunization History  Administered Date(s) Administered   Hepatitis B 10/12/2006   Influenza Split 02/24/2022   Influenza-Unspecified 02/10/2013, 03/03/2021   MMR 05/13/1982   PFIZER(Purple Top)SARS-COV-2 Vaccination 06/01/2019, 06/24/2019, 03/24/2020   PNEUMOCOCCAL CONJUGATE-20 11/26/2021   Tdap 05/13/2005, 01/21/2017    Past Medical History:  Diagnosis Date   Asthma    only uses maintanence inhaler   Gastroparesis     GERD (gastroesophageal reflux disease)    takes meds   History of MRSA infection    IBS (irritable bowel syndrome)    Migraine    PONV (postoperative nausea and vomiting)     Tobacco History: Social History   Tobacco Use  Smoking Status Never   Passive exposure: Past  Smokeless Tobacco Never   Counseling given: Not Answered   Outpatient Medications Prior to Visit  Medication Sig Dispense Refill   Budeson-Glycopyrrol-Formoterol (BREZTRI AEROSPHERE) 160-9-4.8 MCG/ACT AERO Inhale 2 puffs into the lungs in the morning and at bedtime. 10.7 g 5   cetirizine (ZYRTEC) 10 MG tablet Can take one tablet by mouth once daily if needed. 90 tablet 1   diclofenac sodium (VOLTAREN) 1 % GEL Apply 4 g topically 4 (four) times daily. 150 g 1   Fluticasone Propionate (FLONASE ALLERGY RELIEF NA) Place into the nose.     gabapentin (NEURONTIN) 400 MG capsule Take 1 capsule (400 mg total) by mouth 3 (three) times daily. 270 capsule 0   levalbuterol (XOPENEX HFA) 45 MCG/ACT inhaler Inhale 2 puffs into the lungs every 4 to 6 hours as needed for cough or wheeze. 15 g 0   levonorgestrel (MIRENA, 52 MG,) 20 MCG/24HR IUD Mirena 20 mcg/24 hours (6 yrs) 52 mg intrauterine device  Take 1 device by intrauterine route.     magnesium 30 MG tablet Take 30 mg by mouth daily.     meclizine (ANTIVERT) 12.5 MG tablet Take 1/2- 1 tablet by mouth every 6 hours if needed for vertigo. 45 tablet 1   Multiple Vitamin (MULTIVITAMIN PO) Take by mouth.     nebivolol (BYSTOLIC) 5 MG tablet Take 1 tablet (5 mg total) by mouth daily. 90 tablet 1   olmesartan (BENICAR) 20 MG tablet Take 1 tablet by mouth daily. 90 tablet 1   omeprazole (PRILOSEC) 40 MG capsule Take 1 capsule by mouth 2 times per day 60 capsule 5   ondansetron (ZOFRAN) 4 MG tablet Take 1 tablet (4 mg total) by mouth every 8 (eight) hours as needed for nausea or vomiting. 10 tablet 0   Semaglutide (RYBELSUS) 7 MG TABS Take 7 mg by mouth daily. 90 tablet 0    traZODone (DESYREL) 50 MG tablet Take 1-2 tablets by mouth at bedtime for insomnia 45 tablet 5   VITAMIN D PO Take by mouth daily.     benzonatate (TESSALON) 100 MG capsule Take 1 capsule (100 mg total) by mouth 3 (three) times daily as needed. (Patient not taking: Reported on 02/28/2022) 30 capsule 0   No facility-administered medications prior to visit.    Review of Systems  Review of Systems  Constitutional: Negative.   HENT: Negative.    Respiratory: Negative.    Cardiovascular: Negative.    Physical Exam  BP 118/72 (BP Location: Left Arm, Cuff Size: Large)   Pulse 86   Temp 97.7 F (36.5 C) (Temporal)   Ht _0  (1.676 m)   Wt (!) 318 lb 3.2 oz (144.3 kg)   SpO2 99%   BMI 51.36 kg/m  Physical Exam Constitutional:  Appearance: Normal appearance.  Cardiovascular:     Rate and Rhythm: Normal rate and regular rhythm.  Pulmonary:     Effort: Pulmonary effort is normal.     Breath sounds: Normal breath sounds.  Musculoskeletal:        General: Normal range of motion.  Skin:    General: Skin is warm and dry.  Neurological:     General: No focal deficit present.     Mental Status: She is alert and oriented to person, place, and time. Mental status is at baseline.  Psychiatric:        Mood and Affect: Mood normal.        Behavior: Behavior normal.        Thought Content: Thought content normal.        Judgment: Judgment normal.      Lab Results:  CBC    Component Value Date/Time   WBC 11.2 11/19/2021 0000   WBC 9.5 12/05/2020 1626   RBC 4.22 12/05/2020 1626   HGB 12.0 11/19/2021 0000   HCT 36 11/19/2021 0000   PLT 308 11/19/2021 0000   MCV 92.7 12/05/2020 1626   MCH 29.6 04/02/2017 0515   MCHC 33.3 12/05/2020 1626   RDW 13.2 12/05/2020 1626   LYMPHSABS 1.6 12/05/2020 1626   MONOABS 0.5 12/05/2020 1626   EOSABS 0.1 12/05/2020 1626   BASOSABS 0.0 12/05/2020 1626    BMET    Component Value Date/Time   NA 138 11/19/2021 0000   K 4.6 11/19/2021  0000   CL 99 11/19/2021 0000   CO2 24 (A) 11/19/2021 0000   GLUCOSE 85 12/05/2020 1626   BUN 22 (A) 11/19/2021 0000   CREATININE 0.8 11/19/2021 0000   CREATININE 0.73 12/05/2020 1626   CALCIUM 9.6 11/19/2021 0000   GFRNONAA >60 05/04/2009 1849   GFRAA  05/04/2009 1849    >60        The eGFR has been calculated using the MDRD equation. This calculation has not been validated in all clinical situations. eGFR's persistently <60 mL/min signify possible Chronic Kidney Disease.    BNP No results found for: "BNP"  ProBNP No results found for: "PROBNP"  Imaging: MM 3D SCREEN BREAST BILATERAL  Result Date: 02/27/2022 CLINICAL DATA:  Screening. EXAM: DIGITAL SCREENING BILATERAL MAMMOGRAM WITH TOMOSYNTHESIS AND CAD TECHNIQUE: Bilateral screening digital craniocaudal and mediolateral oblique mammograms were obtained. Bilateral screening digital breast tomosynthesis was performed. The images were evaluated with computer-aided detection. COMPARISON:  Previous exam(s). ACR Breast Density Category b: There are scattered areas of fibroglandular density. FINDINGS: There are no findings suspicious for malignancy. IMPRESSION: No mammographic evidence of malignancy. A result letter of this screening mammogram will be mailed directly to the patient. RECOMMENDATION: Screening mammogram in one year. (Code:SM-B-01Y) BI-RADS CATEGORY  1: Negative. Electronically Signed   By: Evangeline Dakin M.D.   On: 02/27/2022 16:39     Assessment & Plan:   Severe sleep apnea - Home sleep study July 2023 showed severe obstructive sleep apnea, AHI 41.7 an hour.  She was started on auto CPAP back in August 2023.  Patient is to percent compliant with CPAP and reports benefit from use.  Pressure setting 5 to 20 cm H2O (95%-13.4 cm H2O); residual AHI 0.7.  No changes to pressure settings needed today.  Continue to encourage patient wear CPAP every night for 4 to 6 hours or longer.  Continue to encourage weight loss efforts.   Advised against driving experiencing excessive daytime sleepiness fatigue.  Renew  CPAP supplies with adapt.  Follow-up in 6 months or sooner if needed.  Insomnia - Well-controlled on trazodone 100 mg at bedtime. Getting 8 hours of sleep at night. No residual daytime sleepiness.  Rx renewed.     Martyn Ehrich, NP 02/28/2022

## 2022-02-28 NOTE — Patient Instructions (Addendum)
Great seeing you today Megan Duran.  Congratulations on your new job Excellent compliance with CPAP, sleep apnea is currently well controlled on current pressure settings Continue her CPAP every night for minimum 4 to 6 hours or longer Continue trazodone 100 mg at bedtime for insomnia Work on weight loss efforts as able  Orders Renew CPAP supplies  Rx Refill trazodone  Follow-up 6 months with Beth NP or sooner if needed

## 2022-02-28 NOTE — Assessment & Plan Note (Signed)
-   Well-controlled on trazodone 100 mg at bedtime. Getting 8 hours of sleep at night. No residual daytime sleepiness.  Rx renewed.

## 2022-02-28 NOTE — Progress Notes (Signed)
Reviewed and agree with assessment/plan.   Johnnie Moten, MD [facility] 02/28/2022, 11:25 AM Pager:  [phone]  

## 2022-03-01 ENCOUNTER — Other Ambulatory Visit: Payer: Self-pay | Admitting: Allergy and Immunology

## 2022-03-05 ENCOUNTER — Other Ambulatory Visit (HOSPITAL_COMMUNITY): Payer: Self-pay

## 2022-03-06 ENCOUNTER — Telehealth: Payer: 59 | Admitting: Nurse Practitioner

## 2022-03-06 ENCOUNTER — Other Ambulatory Visit (HOSPITAL_COMMUNITY): Payer: Self-pay

## 2022-03-06 DIAGNOSIS — J0101 Acute recurrent maxillary sinusitis: Secondary | ICD-10-CM

## 2022-03-06 MED ORDER — AMOXICILLIN-POT CLAVULANATE 875-125 MG PO TABS
1.0000 | ORAL_TABLET | Freq: Two times a day (BID) | ORAL | 0 refills | Status: DC
  Filled 2022-03-06: qty 14, 7d supply, fill #0

## 2022-03-06 NOTE — Progress Notes (Signed)

## 2022-03-08 ENCOUNTER — Other Ambulatory Visit (HOSPITAL_COMMUNITY): Payer: Self-pay

## 2022-03-19 DIAGNOSIS — G4733 Obstructive sleep apnea (adult) (pediatric): Secondary | ICD-10-CM | POA: Diagnosis not present

## 2022-03-20 ENCOUNTER — Telehealth: Payer: 59 | Admitting: Physician Assistant

## 2022-03-20 DIAGNOSIS — J069 Acute upper respiratory infection, unspecified: Secondary | ICD-10-CM

## 2022-03-21 DIAGNOSIS — R0981 Nasal congestion: Secondary | ICD-10-CM | POA: Diagnosis not present

## 2022-03-21 DIAGNOSIS — J019 Acute sinusitis, unspecified: Secondary | ICD-10-CM | POA: Diagnosis not present

## 2022-03-21 DIAGNOSIS — J209 Acute bronchitis, unspecified: Secondary | ICD-10-CM | POA: Diagnosis not present

## 2022-03-21 DIAGNOSIS — R051 Acute cough: Secondary | ICD-10-CM | POA: Diagnosis not present

## 2022-03-21 NOTE — Progress Notes (Signed)
Because of ongoing symptoms despite recent treatment with Augmentin via e-visit, I feel your condition warrants further evaluation and I recommend that you be seen in a face to face visit.   NOTE: There will be NO CHARGE for this eVisit   If you are having a true medical emergency please call 911.      For an urgent face to face visit, [place] has seven urgent care centers for your convenience:     [facility] at [facility] Directions [phone] [address] [place], [place] [address]    [facility] Health Urgent Care Center [facility]) Get Driving Directions [phone] [address] [place], [place] [address]  [facility] Health Urgent Care Center [facility]) Get Driving Directions [phone] [address] [place],  [place]  [address]  [facility] Health Urgent Care Center [facility] at [facility] Directions  [phone] [address] [place],  [place] [address]   [facility] Health Urgent Care at [facility] Get Driving Directions [phone] [address] [place], [place] [address]   [facility] Health Urgent Care at [facility] Get Driving Directions  [phone] [address] [place], [place] [address]   [facility] Health Urgent Care at [facility] Directions [phone] [address] [place], [place] [address]  Your MyChart E-visit questionnaire answers were reviewed by a board certified advanced clinical practitioner to complete your personal care plan based on your specific symptoms.  Thank you for using e-Visits.

## 2022-04-01 ENCOUNTER — Other Ambulatory Visit: Payer: Self-pay | Admitting: Allergy and Immunology

## 2022-04-02 ENCOUNTER — Other Ambulatory Visit (HOSPITAL_COMMUNITY): Payer: Self-pay

## 2022-04-03 ENCOUNTER — Other Ambulatory Visit (HOSPITAL_COMMUNITY): Payer: Self-pay

## 2022-04-03 ENCOUNTER — Encounter: Payer: Self-pay | Admitting: Internal Medicine

## 2022-04-05 ENCOUNTER — Other Ambulatory Visit (HOSPITAL_COMMUNITY): Payer: Self-pay

## 2022-04-05 ENCOUNTER — Other Ambulatory Visit: Payer: Self-pay | Admitting: Allergy and Immunology

## 2022-04-08 ENCOUNTER — Other Ambulatory Visit: Payer: Self-pay | Admitting: Internal Medicine

## 2022-04-08 ENCOUNTER — Other Ambulatory Visit (HOSPITAL_COMMUNITY): Payer: Self-pay

## 2022-04-08 DIAGNOSIS — E1169 Type 2 diabetes mellitus with other specified complication: Secondary | ICD-10-CM

## 2022-04-08 MED ORDER — RYBELSUS 14 MG PO TABS
14.0000 mg | ORAL_TABLET | Freq: Every day | ORAL | 0 refills | Status: DC
  Filled 2022-04-08: qty 30, 30d supply, fill #0
  Filled 2022-05-02: qty 30, 30d supply, fill #1
  Filled 2022-06-21: qty 30, 30d supply, fill #2

## 2022-04-18 DIAGNOSIS — G4733 Obstructive sleep apnea (adult) (pediatric): Secondary | ICD-10-CM | POA: Diagnosis not present

## 2022-04-23 DIAGNOSIS — G4733 Obstructive sleep apnea (adult) (pediatric): Secondary | ICD-10-CM | POA: Diagnosis not present

## 2022-04-29 DIAGNOSIS — G4733 Obstructive sleep apnea (adult) (pediatric): Secondary | ICD-10-CM | POA: Diagnosis not present

## 2022-05-02 ENCOUNTER — Other Ambulatory Visit (HOSPITAL_COMMUNITY): Payer: Self-pay

## 2022-05-02 ENCOUNTER — Other Ambulatory Visit: Payer: Self-pay | Admitting: Internal Medicine

## 2022-05-02 DIAGNOSIS — B0229 Other postherpetic nervous system involvement: Secondary | ICD-10-CM

## 2022-05-03 ENCOUNTER — Other Ambulatory Visit: Payer: Self-pay

## 2022-05-03 ENCOUNTER — Other Ambulatory Visit (HOSPITAL_COMMUNITY): Payer: Self-pay

## 2022-05-08 ENCOUNTER — Other Ambulatory Visit (HOSPITAL_COMMUNITY): Payer: Self-pay

## 2022-05-18 ENCOUNTER — Other Ambulatory Visit: Payer: Self-pay | Admitting: Internal Medicine

## 2022-05-18 DIAGNOSIS — I119 Hypertensive heart disease without heart failure: Secondary | ICD-10-CM

## 2022-05-18 DIAGNOSIS — I10 Essential (primary) hypertension: Secondary | ICD-10-CM

## 2022-05-27 ENCOUNTER — Other Ambulatory Visit (HOSPITAL_COMMUNITY): Payer: Self-pay

## 2022-05-30 ENCOUNTER — Other Ambulatory Visit: Payer: Self-pay | Admitting: Internal Medicine

## 2022-05-30 ENCOUNTER — Encounter: Payer: Self-pay | Admitting: Internal Medicine

## 2022-05-30 ENCOUNTER — Other Ambulatory Visit: Payer: Self-pay

## 2022-05-30 ENCOUNTER — Other Ambulatory Visit (HOSPITAL_COMMUNITY): Payer: Self-pay

## 2022-05-30 DIAGNOSIS — I10 Essential (primary) hypertension: Secondary | ICD-10-CM

## 2022-05-30 DIAGNOSIS — I119 Hypertensive heart disease without heart failure: Secondary | ICD-10-CM

## 2022-05-30 MED ORDER — OLMESARTAN MEDOXOMIL 20 MG PO TABS
20.0000 mg | ORAL_TABLET | Freq: Every day | ORAL | 0 refills | Status: DC
  Filled 2022-05-30: qty 90, 90d supply, fill #0

## 2022-05-30 MED ORDER — NEBIVOLOL HCL 5 MG PO TABS
5.0000 mg | ORAL_TABLET | Freq: Every day | ORAL | 0 refills | Status: DC
  Filled 2022-05-30: qty 90, 90d supply, fill #0

## 2022-06-19 ENCOUNTER — Other Ambulatory Visit: Payer: Self-pay

## 2022-06-19 ENCOUNTER — Emergency Department (HOSPITAL_COMMUNITY)
Admission: EM | Admit: 2022-06-19 | Discharge: 2022-06-19 | Disposition: A | Discharge status: Home / Self Care | Payer: Commercial Managed Care - PPO | Attending: Emergency Medicine | Admitting: Emergency Medicine

## 2022-06-19 ENCOUNTER — Emergency Department (HOSPITAL_COMMUNITY): Payer: Commercial Managed Care - PPO

## 2022-06-19 ENCOUNTER — Encounter (HOSPITAL_COMMUNITY): Payer: Self-pay

## 2022-06-19 DIAGNOSIS — S92354A Nondisplaced fracture of fifth metatarsal bone, right foot, initial encounter for closed fracture: Secondary | ICD-10-CM

## 2022-06-19 DIAGNOSIS — X501XXA Overexertion from prolonged static or awkward postures, initial encounter: Secondary | ICD-10-CM | POA: Diagnosis not present

## 2022-06-19 DIAGNOSIS — M25571 Pain in right ankle and joints of right foot: Secondary | ICD-10-CM | POA: Diagnosis present

## 2022-06-19 DIAGNOSIS — M7989 Other specified soft tissue disorders: Secondary | ICD-10-CM | POA: Diagnosis not present

## 2022-06-19 MED ORDER — IBUPROFEN 800 MG PO TABS
800.0000 mg | ORAL_TABLET | Freq: Once | ORAL | Status: AC
  Administered 2022-06-19: 800 mg via ORAL
  Filled 2022-06-19: qty 1

## 2022-06-19 MED ORDER — ONDANSETRON 4 MG PO TBDP: 4.0000 mg | ORAL_TABLET | Freq: Three times a day (TID) | ORAL | 0 refills | Status: DC | PRN

## 2022-06-19 MED ORDER — HYDROCODONE-ACETAMINOPHEN 5-325 MG PO TABS: 1.0000 | ORAL_TABLET | ORAL | 0 refills | Status: DC | PRN

## 2022-06-19 NOTE — ED Notes (Signed)
Ortho tech at bedside 

## 2022-06-19 NOTE — ED Triage Notes (Signed)
Pt states she twisted her right ankle about 0800 this morning. Pt states she heard a pop at that time. Denies any other injuries.

## 2022-06-19 NOTE — ED Provider Notes (Signed)
[facility] [place] [facility] Provider Note   CSN: [id number] Arrival date & time: 06/19/22  1258     History  Chief Complaint  Patient presents with   Foot Pain    Megan Duran is a 42 y.o. female.  The history is provided by the patient. No language interpreter was used.  Foot Pain This is a new problem. The current episode started 3 to 5 hours ago. The problem occurs constantly. The problem has not changed since onset.Pertinent negatives include no chest pain, no abdominal pain, no headaches and no shortness of breath. The symptoms are aggravated by walking. Nothing relieves the symptoms. She has tried nothing for the symptoms. The treatment provided no relief.       Home Medications Prior to Admission medications   Medication Sig Start Date End Date Taking? Authorizing Provider  amoxicillin-clavulanate (AUGMENTIN) 875-125 MG tablet Take 1 tablet by mouth 2 (two) times daily. 03/06/22   Hassell Done, Mary-Margaret, FNP  Budeson-Glycopyrrol-Formoterol (BREZTRI AEROSPHERE) 160-9-4.8 MCG/ACT AERO Inhale 2 puffs into the lungs in the morning and at bedtime. 10/16/21   Kozlow, Donnamarie Poag, MD  cetirizine (ZYRTEC) 10 MG tablet Can take one tablet by mouth once daily if needed. 08/26/18   Kozlow, Donnamarie Poag, MD  diclofenac sodium (VOLTAREN) 1 % GEL Apply 4 g topically 4 (four) times daily. 12/09/18   Landis Martins, DPM  Fluticasone Propionate (FLONASE ALLERGY RELIEF NA) Place into the nose.    [provider]  gabapentin (NEURONTIN) 400 MG capsule Take 1 capsule (400 mg total) by mouth 3 (three) times daily. 01/27/22   Janith Lima, MD  levalbuterol [facility] HFA) 45 MCG/ACT inhaler Inhale 2 puffs into the lungs every 4 to 6 hours as needed for cough or wheeze. 01/08/22   Kozlow, Donnamarie Poag, MD  levonorgestrel (MIRENA, 52 MG,) 20 MCG/24HR IUD Mirena 20 mcg/24 hours (6 yrs) 52 mg intrauterine device  Take 1 device by intrauterine route.    [provider]   magnesium 30 MG tablet Take 30 mg by mouth daily.    [provider]  meclizine (ANTIVERT) 12.5 MG tablet Take 1/2- 1 tablet by mouth every 6 hours if needed for vertigo. 03/15/21   Kozlow, Donnamarie Poag, MD  Multiple Vitamin (MULTIVITAMIN PO) Take by mouth.    [provider]  nebivolol (BYSTOLIC) 5 MG tablet Take 1 tablet (5 mg total) by mouth daily. 05/30/22   Janith Lima, MD  olmesartan (BENICAR) 20 MG tablet Take 1 tablet (20 mg total) by mouth daily. 05/30/22   Janith Lima, MD  omeprazole (PRILOSEC) 40 MG capsule Take 1 capsule by mouth 2 times per day 10/16/21   Jiles Prows, MD  ondansetron (ZOFRAN) 4 MG tablet Take 1 tablet (4 mg total) by mouth every 8 (eight) hours as needed for nausea or vomiting. 01/21/22   Apolonio Schneiders, FNP  Semaglutide (RYBELSUS) 14 MG TABS Take 1 tablet (14 mg total) by mouth daily. 04/08/22   Janith Lima, MD  traZODone (DESYREL) 100 MG tablet Take 1 tablet (100 mg total) by mouth at bedtime as needed for sleep. 02/28/22   Martyn Ehrich, NP  VITAMIN D PO Take by mouth daily.    [provider]      Allergies    Bactrim [sulfamethoxazole-trimethoprim], Biaxin [clarithromycin], Doxycycline, Effexor [venlafaxine], Erythromycin, Imitrex [sumatriptan], Keflex [cephalexin], and Minocycline    Review of Systems   Review of Systems  Constitutional:  Negative for  chills, fatigue and fever.  HENT:  Negative for congestion.   Respiratory:  Negative for cough, chest tightness and shortness of breath.   Cardiovascular:  Negative for chest pain.  Gastrointestinal:  Negative for abdominal pain, constipation, diarrhea, nausea and vomiting.  Genitourinary:  Negative for dysuria.  Musculoskeletal:  Negative for back pain, neck pain and neck stiffness.  Skin:  Positive for color change (bruising). Negative for rash and wound.  Neurological:  Negative for weakness, light-headedness and headaches.  Psychiatric/Behavioral:  Negative for  agitation and confusion.   All other systems reviewed and are negative.   Physical Exam Updated Vital Signs BP (!) 140/80   Pulse 98   Resp 19   Ht 5\' 5"  (1.651 m)   Wt (!) 143.3 kg   SpO2 99%   BMI 52.59 kg/m  Physical Exam Vitals and nursing note reviewed.  Constitutional:      General: She is not in acute distress.    Appearance: She is well-developed.  HENT:     Head: Normocephalic and atraumatic.     Nose: Nose normal.  Eyes:     Conjunctiva/sclera: Conjunctivae normal.  Pulmonary:     Effort: No respiratory distress.     Breath sounds: No stridor. No wheezing, rhonchi or rales.  Chest:     Chest wall: No tenderness.  Abdominal:     General: There is no distension.     Tenderness: There is no abdominal tenderness. There is no guarding or rebound.  Musculoskeletal:        General: Swelling and tenderness present.     Cervical back: Normal range of motion and neck supple.     Right lower leg: No edema.     Left lower leg: No edema.  Skin:    General: Skin is warm.     Findings: Bruising present. No erythema or rash.  Neurological:     Mental Status: She is alert.     Motor: No abnormal muscle tone.     Deep Tendon Reflexes: Reflexes are normal and symmetric.  Psychiatric:        Mood and Affect: Mood normal.     ED Results / Procedures / Treatments   Labs (all labs ordered are listed, but only abnormal results are displayed) Labs Reviewed - No data to display  EKG None  Radiology DG Ankle Complete Right  Result Date: 06/19/2022 CLINICAL DATA:  Injury EXAM: RIGHT FOOT COMPLETE - 3 VIEW; RIGHT ANKLE - COMPLETE 3 VIEW COMPARISON:  None Available. FINDINGS: Nondisplaced fracture of the base of the fifth metatarsal. No evidence of arthropathy or other focal bone abnormality. Soft tissue swelling of the midfoot. IMPRESSION: Nondisplaced fracture of the base of the fifth metatarsal. Electronically Signed   By: Yetta Glassman M.D.   On: 06/19/2022 13:35   DG  Foot Complete Right  Result Date: 06/19/2022 CLINICAL DATA:  Injury EXAM: RIGHT FOOT COMPLETE - 3 VIEW; RIGHT ANKLE - COMPLETE 3 VIEW COMPARISON:  None Available. FINDINGS: Nondisplaced fracture of the base of the fifth metatarsal. No evidence of arthropathy or other focal bone abnormality. Soft tissue swelling of the midfoot. IMPRESSION: Nondisplaced fracture of the base of the fifth metatarsal. Electronically Signed   By: Yetta Glassman M.D.   On: 06/19/2022 13:35    Procedures Procedures    Medications Ordered in ED Medications - No data to display  ED Course/ Medical Decision Making/ A&P  Medical Decision Making Amount and/or Complexity of Data Reviewed Radiology: ordered.  Risk Prescription drug management.    Megan Duran is a 42 y.o. female with a past medical history significant for migraines, GERD, IBS, asthma, and gastroparesis who presents with ankle injury.  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  She then went to the ground and thought she may have twisted it.  She denies any other traumatic injuries or other complaints.  She was at her baseline earlier and had no fevers, chills, cough, or other complaints.  She denies history of significant ankle injury or ankle surgeries in the past.  She has not seen orthopedics or other injuries in the past.  On exam, lungs clear and chest nontender.  Hip nontender, knee nontender.  Calf nontender.  Patient has some bruising, tenderness, and swelling to the lateral right foot and ankle.  Intact sensation, strength, and pulses.  Normal range of motion but with pain.  No laceration.  X-rays were obtained showing evidence of a suspected Jones fracture.  I personally viewed the images and agree with the radiology interpretation of fracture.  I spoke with Dr. Mardelle Matte with orthopedics who recommended a  posterior splint with crutches and nonweightbearing and then follow-up with orthopedics in the next week or so.  Patient agrees this plan.  Will send in a prescription for pain medicine to use as needed and will give work note.  Patient agrees with this plan and will discharge after splinting.   Patient reassessed and she has intact cap refill, sensation, and strength in the toes after splint placement.  Will discharge with plan for follow-up as discussed.        Final Clinical Impression(s) / ED Diagnoses Final diagnoses:  Closed nondisplaced fracture of fifth metatarsal bone of right foot, initial encounter    Rx / DC Orders ED Discharge Orders          Ordered    ondansetron (ZOFRAN-ODT) 4 MG disintegrating tablet  Every 8 hours PRN        06/19/22 1501    HYDROcodone-acetaminophen (NORCO/VICODIN) 5-325 MG tablet  Every 4 hours PRN        06/19/22 1501           Clinical Impression: 1. Closed nondisplaced fracture of fifth metatarsal bone of right foot, initial encounter     Disposition: Discharge  Condition: Good  I have discussed the results, Dx and Tx plan with the pt(& family if present). He/she/they expressed understanding and agree(s) with the plan. Discharge instructions discussed at great length. Strict return precautions discussed and pt &/or family have verbalized understanding of the instructions. No further questions at time of discharge.    New Prescriptions   HYDROCODONE-ACETAMINOPHEN (NORCO/VICODIN) 5-325 MG TABLET    Take 1 tablet by mouth every 4 (four) hours as needed.   ONDANSETRON (ZOFRAN-ODT) 4 MG DISINTEGRATING TABLET    Take 1 tablet (4 mg total) by mouth every 8 (eight) hours as needed for nausea or vomiting.    Follow Up: Marchia Bond, MD [address] [place] [address] [phone]  Call in 3 days with [facility] at [facility] [address] [place]        Alisah Grandberry, Gwenyth Allegra, MD 06/19/22 [phone]

## 2022-06-19 NOTE — Discharge Instructions (Signed)
Your history, exam, workup today revealed a 5th metatarsal fracture in your foot causing the pain and swelling.  Spoke to orthopedics who recommended outpatient follow-up in the next week or so.  Please keep it nonweightbearing and use the crutches.  If any symptoms change or worsen acutely, please return to the nearest emergency department.

## 2022-06-20 ENCOUNTER — Ambulatory Visit: Payer: Commercial Managed Care - PPO | Admitting: Internal Medicine

## 2022-06-20 ENCOUNTER — Telehealth: Payer: Self-pay | Admitting: Allergy and Immunology

## 2022-06-20 MED ORDER — OMEPRAZOLE 40 MG PO CPDR
40.0000 mg | DELAYED_RELEASE_CAPSULE | Freq: Two times a day (BID) | ORAL | 2 refills | Status: AC
  Filled 2022-06-20: qty 60, 30d supply, fill #0
  Filled 2022-07-16: qty 60, 30d supply, fill #1
  Filled 2022-08-15: qty 60, 30d supply, fill #2

## 2022-06-20 NOTE — Telephone Encounter (Signed)
Refill sent to pharmacy and patient informed.

## 2022-06-20 NOTE — Telephone Encounter (Signed)
Patient is requesting a refill for her Omeprazole. I did inform patient she is over due for a visit and states she recently had foot surgery and will give [place] a call to schedule when she is healed. She is OK with buying Omeprazole OTC but says it is cheaper as a prescription.

## 2022-06-21 ENCOUNTER — Other Ambulatory Visit (HOSPITAL_BASED_OUTPATIENT_CLINIC_OR_DEPARTMENT_OTHER): Payer: Self-pay

## 2022-06-21 ENCOUNTER — Other Ambulatory Visit (HOSPITAL_COMMUNITY): Payer: Self-pay

## 2022-06-21 ENCOUNTER — Other Ambulatory Visit: Payer: Self-pay | Admitting: Allergy and Immunology

## 2022-06-21 DIAGNOSIS — S92354A Nondisplaced fracture of fifth metatarsal bone, right foot, initial encounter for closed fracture: Secondary | ICD-10-CM | POA: Diagnosis not present

## 2022-06-22 ENCOUNTER — Other Ambulatory Visit (HOSPITAL_COMMUNITY): Payer: Self-pay

## 2022-06-27 DIAGNOSIS — S92354D Nondisplaced fracture of fifth metatarsal bone, right foot, subsequent encounter for fracture with routine healing: Secondary | ICD-10-CM | POA: Diagnosis not present

## 2022-07-02 ENCOUNTER — Other Ambulatory Visit (HOSPITAL_COMMUNITY): Payer: Self-pay

## 2022-07-11 ENCOUNTER — Ambulatory Visit: Payer: Commercial Managed Care - PPO | Admitting: Internal Medicine

## 2022-07-16 ENCOUNTER — Other Ambulatory Visit: Payer: Self-pay

## 2022-07-19 DIAGNOSIS — S92354D Nondisplaced fracture of fifth metatarsal bone, right foot, subsequent encounter for fracture with routine healing: Secondary | ICD-10-CM | POA: Diagnosis not present

## 2022-07-23 DIAGNOSIS — G4733 Obstructive sleep apnea (adult) (pediatric): Secondary | ICD-10-CM | POA: Diagnosis not present

## 2022-07-24 ENCOUNTER — Telehealth: Payer: Commercial Managed Care - PPO | Admitting: Family

## 2022-07-24 DIAGNOSIS — J069 Acute upper respiratory infection, unspecified: Secondary | ICD-10-CM | POA: Diagnosis not present

## 2022-07-24 MED ORDER — BENZONATATE 100 MG PO CAPS: 100.0000 mg | ORAL_CAPSULE | Freq: Three times a day (TID) | ORAL | 0 refills | Status: DC | PRN

## 2022-07-24 MED ORDER — FLUTICASONE PROPIONATE 50 MCG/ACT NA SUSP: 2.0000 | Freq: Every day | NASAL | 6 refills | Status: DC

## 2022-07-24 MED ORDER — CETIRIZINE HCL 10 MG PO TABS: 10.0000 mg | ORAL_TABLET | Freq: Every day | ORAL | 1 refills | Status: DC

## 2022-07-24 NOTE — Progress Notes (Signed)
E-Visit for Upper Respiratory Infection  ° °We are sorry you are not feeling well.  Here is how we plan to help! ° °Based on what you have shared with me, it looks like you may have a viral upper respiratory infection.  Upper respiratory infections are caused by a large number of viruses; however, rhinovirus is the most common cause.  ° °Symptoms vary from person to person, with common symptoms including sore throat, cough, fatigue or lack of energy and feeling of general discomfort.  A low-grade fever of up to 100.4 may present, but is often uncommon.  Symptoms vary however, and are closely related to a person's age or underlying illnesses.  The most common symptoms associated with an upper respiratory infection are nasal discharge or congestion, cough, sneezing, headache and pressure in the ears and face.  These symptoms usually persist for about 3 to 10 days, but can last up to 2 weeks.  It is important to know that upper respiratory infections do not cause serious illness or complications in most cases.   ° °Upper respiratory infections can be transmitted from person to person, with the most common method of transmission being a person's hands.  The virus is able to live on the skin and can infect other persons for up to 2 hours after direct contact.  Also, these can be transmitted when someone coughs or sneezes; thus, it is important to cover the mouth to reduce this risk.  To keep the spread of the illness at bay, good hand hygiene is very important. ° °This is an infection that is most likely caused by a virus. There are no specific treatments other than to help you with the symptoms until the infection runs its course.  We are sorry you are not feeling well.  Here is how we plan to help! ° ° °For nasal congestion, you may use an oral decongestants such as Mucinex D or if you have glaucoma or high blood pressure use plain Mucinex.  Saline nasal spray or nasal drops can help and can safely be used as often as  needed for congestion.  For your congestion, I have prescribed Fluticasone nasal spray one spray in each nostril twice a day and zyrtec 10 mg daily.  ° °If you do not have a history of heart disease, hypertension, diabetes or thyroid disease, prostate/bladder issues or glaucoma, you may also use Sudafed to treat nasal congestion.  It is highly recommended that you consult with a pharmacist or your primary care physician to ensure this medication is safe for you to take.    ° °If you have a cough, you may use cough suppressants such as Delsym and Robitussin.  If you have glaucoma or high blood pressure, you can also use Coricidin HBP.   °For cough I have prescribed for you A prescription cough medication called Tessalon Perles 100 mg. You may take 1-2 capsules every 8 hours as needed for cough ° °If you have a sore or scratchy throat, use a saltwater gargle- ¼ to ½ teaspoon of salt dissolved in a 4-ounce to 8-ounce glass of warm water.  Gargle the solution for approximately 15-30 seconds and then spit.  It is important not to swallow the solution.  You can also use throat lozenges/cough drops and Chloraseptic spray to help with throat pain or discomfort.  Warm or cold liquids can also be helpful in relieving throat pain. ° °For headache, pain or general discomfort, you can use Ibuprofen or Tylenol as   directed.   °Some authorities believe that zinc sprays or the use of Echinacea may shorten the course of your symptoms. ° ° °HOME CARE °Only take medications as instructed by your medical team. °Be sure to drink plenty of fluids. Water is fine as well as fruit juices, sodas and electrolyte beverages. You may want to stay away from caffeine or alcohol. If you are nauseated, try taking small sips of liquids. How do you know if you are getting enough fluid? Your urine should be a pale yellow or almost colorless. °Get rest. °Taking a steamy shower or using a humidifier may help nasal congestion and ease sore throat pain. You  can place a towel over your head and breathe in the steam from hot water coming from a faucet. °Using a saline nasal spray works much the same way. °Cough drops, hard candies and sore throat lozenges may ease your cough. °Avoid close contacts especially the very young and the elderly °Cover your mouth if you cough or sneeze °Always remember to wash your hands.  ° °GET HELP RIGHT AWAY IF: °You develop worsening fever. °If your symptoms do not improve within 10 days °You develop yellow or green discharge from your nose over 3 days. °You have coughing fits °You develop a severe head ache or visual changes. °You develop shortness of breath, difficulty breathing or start having chest pain °Your symptoms persist after you have completed your treatment plan ° °MAKE SURE YOU  °Understand these instructions. °Will watch your condition. °Will get help right away if you are not doing well or get worse. ° °Thank you for choosing an e-visit. ° °Your e-visit answers were reviewed by a board certified advanced clinical practitioner to complete your personal care plan. Depending upon the condition, your plan could have included both over the counter or prescription medications. ° °Please review your pharmacy choice. Make sure the pharmacy is open so you can pick up prescription now. If there is a problem, you may contact your provider through MyChart messaging and have the prescription routed to another pharmacy.  Your safety is important to us. If you have drug allergies check your prescription carefully.  ° °For the next 24 hours you can use MyChart to ask questions about today's visit, request a non-urgent call back, or ask for a work or school excuse. °You will get an email in the next two days asking about your experience. I hope that your e-visit has been valuable and will speed your recovery. ° °Approximately 5 minutes was spent documenting and reviewing patient's chart.  ° ° ° °

## 2022-07-25 ENCOUNTER — Other Ambulatory Visit (HOSPITAL_COMMUNITY): Payer: Self-pay

## 2022-07-27 ENCOUNTER — Telehealth: Payer: Commercial Managed Care - PPO | Admitting: Family Medicine

## 2022-07-27 DIAGNOSIS — R110 Nausea: Secondary | ICD-10-CM

## 2022-07-27 DIAGNOSIS — B9689 Other specified bacterial agents as the cause of diseases classified elsewhere: Secondary | ICD-10-CM

## 2022-07-27 MED ORDER — AMOXICILLIN-POT CLAVULANATE 875-125 MG PO TABS: 1.0000 | ORAL_TABLET | Freq: Two times a day (BID) | ORAL | 0 refills | Status: DC

## 2022-07-27 MED ORDER — ONDANSETRON HCL 4 MG PO TABS: 4.0000 mg | ORAL_TABLET | Freq: Three times a day (TID) | ORAL | 0 refills | Status: DC | PRN

## 2022-07-27 NOTE — Progress Notes (Signed)
E-Visit for Sinus Problems  We are sorry that you are not feeling well.  Here is how we plan to help!  Based on what you have shared with me it looks like you have sinusitis.  Sinusitis is inflammation and infection in the sinus cavities of the head.  Based on your presentation I believe you most likely have Acute Bacterial Sinusitis.  This is an infection caused by bacteria and is treated with antibiotics. I have prescribed Augmentin 875mg /125mg  one tablet twice daily with food, for 7 days. You may use an oral decongestant such as Mucinex D or if you have glaucoma or high blood pressure use plain Mucinex. Saline nasal spray help and can safely be used as often as needed for congestion.  If you develop worsening sinus pain, fever or notice severe headache and vision changes, or if symptoms are not better after completion of antibiotic, please schedule an appointment with a health care provider.    Sinus infections are not as easily transmitted as other respiratory infection, however we still recommend that you avoid close contact with loved ones, especially the very young and elderly.  Remember to wash your hands thoroughly throughout the day as this is the number one way to prevent the spread of infection! I am also sending zofran for nausea.  Home Care: Only take medications as instructed by your medical team. Complete the entire course of an antibiotic. Do not take these medications with alcohol. A steam or ultrasonic humidifier can help congestion.  You can place a towel over your head and breathe in the steam from hot water coming from a faucet. Avoid close contacts especially the very young and the elderly. Cover your mouth when you cough or sneeze. Always remember to wash your hands.  Get Help Right Away If: You develop worsening fever or sinus pain. You develop a severe head ache or visual changes. Your symptoms persist after you have completed your treatment plan.  Make sure  you Understand these instructions. Will watch your condition. Will get help right away if you are not doing well or get worse.  Thank you for choosing an e-visit.  Your e-visit answers were reviewed by a board certified advanced clinical practitioner to complete your personal care plan. Depending upon the condition, your plan could have included both over the counter or prescription medications.  Please review your pharmacy choice. Make sure the pharmacy is open so you can pick up prescription now. If there is a problem, you may contact your provider through [employer] and have the prescription routed to another pharmacy.  Your safety is important to [place]. If you have drug allergies check your prescription carefully.   For the next 24 hours you can use MyChart to ask questions about today's visit, request a non-urgent call back, or ask for a work or school excuse. You will get an email in the next two days asking about your experience. I hope that your e-visit has been valuable and will speed your recovery.    have provided 5 minutes of non face to face time during this encounter for chart review and documentation.

## 2022-08-01 ENCOUNTER — Ambulatory Visit: Payer: Commercial Managed Care - PPO | Admitting: Internal Medicine

## 2022-08-07 ENCOUNTER — Other Ambulatory Visit: Payer: Self-pay | Admitting: Internal Medicine

## 2022-08-07 DIAGNOSIS — E1169 Type 2 diabetes mellitus with other specified complication: Secondary | ICD-10-CM

## 2022-08-12 ENCOUNTER — Other Ambulatory Visit (HOSPITAL_COMMUNITY): Payer: Self-pay

## 2022-08-12 DIAGNOSIS — S92354D Nondisplaced fracture of fifth metatarsal bone, right foot, subsequent encounter for fracture with routine healing: Secondary | ICD-10-CM | POA: Diagnosis not present

## 2022-08-15 ENCOUNTER — Other Ambulatory Visit: Payer: Self-pay

## 2022-08-18 ENCOUNTER — Telehealth: Payer: Commercial Managed Care - PPO | Admitting: Family

## 2022-08-18 DIAGNOSIS — J019 Acute sinusitis, unspecified: Secondary | ICD-10-CM

## 2022-08-18 NOTE — Progress Notes (Signed)
Because your symptoms continue and you were just treated last month for a sinus infection, I feel your condition warrants further evaluation and I recommend that you be seen in a face to face visit.   NOTE: There will be NO CHARGE for this eVisit   If you are having a true medical emergency please call 911.      For an urgent face to face visit, [place] has eight urgent care centers for your convenience:   NEW!! [facility] at [facility] Get Driving Directions [phone] [address] [place], [address]    [facility] at [facility] Get Driving Directions [phone] [address] [place], [place] [address]   [facility] Care Center [facility]) Get Driving Directions [phone] [address] [place], [place] [address]  [facility] Care Center [facility]) Get Driving Directions [phone] [address] [place],  [place]  [address]  [facility]gent [facility] Center [facility] - at [employer]  [phone] [address] [place],  [place] [address]   [facility] at [facility] Get Driving Directions [phone] [address] [place], [place] [address]   [facility] Health Urgent Care at [facility] Get Driving Directions  [phone] [address] [place], [place] [address]   [facility] Urgent Care at [facility] Directions [phone] [address] [place], [place] [address]  Your MyChart E-visit questionnaire answers were reviewed by a board certified advanced clinical practitioner to complete your personal care plan based on your specific symptoms.  Thank you for using e-Visits.

## 2022-08-20 DIAGNOSIS — J324 Chronic pansinusitis: Secondary | ICD-10-CM | POA: Diagnosis not present

## 2022-08-20 DIAGNOSIS — R509 Fever, unspecified: Secondary | ICD-10-CM | POA: Diagnosis not present

## 2022-08-20 DIAGNOSIS — R0981 Nasal congestion: Secondary | ICD-10-CM | POA: Diagnosis not present

## 2022-08-20 DIAGNOSIS — R051 Acute cough: Secondary | ICD-10-CM | POA: Diagnosis not present

## 2022-08-22 ENCOUNTER — Ambulatory Visit: Payer: Commercial Managed Care - PPO | Admitting: Internal Medicine

## 2022-08-22 ENCOUNTER — Encounter: Payer: Self-pay | Admitting: Internal Medicine

## 2022-08-22 ENCOUNTER — Other Ambulatory Visit (HOSPITAL_COMMUNITY): Payer: Self-pay

## 2022-08-22 VITALS — BP 142/86 | HR 105 | Temp 98.5°F | Resp 16 | Ht 65.0 in | Wt 322.0 lb

## 2022-08-22 DIAGNOSIS — E1159 Type 2 diabetes mellitus with other circulatory complications: Secondary | ICD-10-CM

## 2022-08-22 DIAGNOSIS — E611 Iron deficiency: Secondary | ICD-10-CM | POA: Diagnosis not present

## 2022-08-22 DIAGNOSIS — Z23 Encounter for immunization: Secondary | ICD-10-CM | POA: Diagnosis not present

## 2022-08-22 DIAGNOSIS — I10 Essential (primary) hypertension: Secondary | ICD-10-CM

## 2022-08-22 DIAGNOSIS — E1169 Type 2 diabetes mellitus with other specified complication: Secondary | ICD-10-CM | POA: Diagnosis not present

## 2022-08-22 DIAGNOSIS — I119 Hypertensive heart disease without heart failure: Secondary | ICD-10-CM | POA: Diagnosis not present

## 2022-08-22 DIAGNOSIS — R000 Tachycardia, unspecified: Secondary | ICD-10-CM

## 2022-08-22 DIAGNOSIS — I152 Hypertension secondary to endocrine disorders: Secondary | ICD-10-CM

## 2022-08-22 DIAGNOSIS — Z0000 Encounter for general adult medical examination without abnormal findings: Secondary | ICD-10-CM

## 2022-08-22 DIAGNOSIS — Z6841 Body Mass Index (BMI) 40.0 and over, adult: Secondary | ICD-10-CM | POA: Diagnosis not present

## 2022-08-22 DIAGNOSIS — Z0001 Encounter for general adult medical examination with abnormal findings: Secondary | ICD-10-CM

## 2022-08-22 DIAGNOSIS — Z7985 Long-term (current) use of injectable non-insulin antidiabetic drugs: Secondary | ICD-10-CM

## 2022-08-22 LAB — MICROALBUMIN / CREATININE URINE RATIO
Creatinine,U: 84.9 mg/dL
Microalb Creat Ratio: 9.5 mg/g (ref 0.0–30.0)
Microalb, Ur: 8.1 mg/dL — ABNORMAL HIGH (ref 0.0–1.9)

## 2022-08-22 LAB — HEPATIC FUNCTION PANEL
ALT: 27 U/L (ref 0–35)
AST: 16 U/L (ref 0–37)
Albumin: 4.5 g/dL (ref 3.5–5.2)
Alkaline Phosphatase: 101 U/L (ref 39–117)
Bilirubin, Direct: 0 mg/dL (ref 0.0–0.3)
Total Bilirubin: 0.2 mg/dL (ref 0.2–1.2)
Total Protein: 7.9 g/dL (ref 6.0–8.3)

## 2022-08-22 LAB — BASIC METABOLIC PANEL
BUN: 17 mg/dL (ref 6–23)
CO2: 26 mEq/L (ref 19–32)
Calcium: 9.7 mg/dL (ref 8.4–10.5)
Chloride: 100 mEq/L (ref 96–112)
Creatinine, Ser: 0.9 mg/dL (ref 0.40–1.20)
GFR: 78.52 mL/min (ref >60.00)
Glucose, Bld: 118 mg/dL — ABNORMAL HIGH (ref 70–99)
Potassium: 4.5 mEq/L (ref 3.5–5.1)
Sodium: 135 mEq/L (ref 135–145)

## 2022-08-22 LAB — CBC WITH DIFFERENTIAL/PLATELET
Basophils Absolute: 0.1 10*3/uL (ref 0.0–0.1)
Basophils Relative: 0.5 % (ref 0.0–3.0)
Eosinophils Absolute: 0 10*3/uL (ref 0.0–0.7)
Eosinophils Relative: 0 % (ref 0.0–5.0)
HCT: 41.2 % (ref 36.0–46.0)
Hemoglobin: 13.4 g/dL (ref 12.0–15.0)
Lymphocytes Relative: 7.4 % — ABNORMAL LOW (ref 12.0–46.0)
Lymphs Abs: 1.1 10*3/uL (ref 0.7–4.0)
MCHC: 32.4 g/dL (ref 30.0–36.0)
MCV: 90.9 fl (ref 78.0–100.0)
Monocytes Absolute: 0.8 10*3/uL (ref 0.1–1.0)
Monocytes Relative: 5.3 % (ref 3.0–12.0)
Neutro Abs: 12.7 10*3/uL — ABNORMAL HIGH (ref 1.4–7.7)
Neutrophils Relative %: 86.8 % — ABNORMAL HIGH (ref 43.0–77.0)
Platelets: 368 10*3/uL (ref 150.0–400.0)
RBC: 4.53 Mil/uL (ref 3.87–5.11)
RDW: 15.4 % (ref 11.5–15.5)
WBC: 14.6 10*3/uL — ABNORMAL HIGH (ref 4.0–10.5)

## 2022-08-22 LAB — URINALYSIS, ROUTINE W REFLEX MICROSCOPIC
Bilirubin Urine: NEGATIVE
Hgb urine dipstick: NEGATIVE
Ketones, ur: NEGATIVE
Nitrite: NEGATIVE
Specific Gravity, Urine: 1.015 (ref 1.000–1.030)
Total Protein, Urine: NEGATIVE
Urine Glucose: NEGATIVE
Urobilinogen, UA: 0.2 (ref 0.0–1.0)
pH: 7.5 (ref 5.0–8.0)

## 2022-08-22 LAB — HEMOGLOBIN A1C: Hgb A1c MFr Bld: 6.2 % (ref 4.6–6.5)

## 2022-08-22 LAB — TSH: TSH: 0.36 u[IU]/mL (ref 0.35–5.50)

## 2022-08-22 MED ORDER — TIRZEPATIDE 2.5 MG/0.5ML ~~LOC~~ SOAJ
2.5000 mg | SUBCUTANEOUS | 0 refills | Status: DC
  Filled 2022-08-22: qty 2, 28d supply, fill #0

## 2022-08-22 NOTE — Patient Instructions (Signed)

## 2022-08-22 NOTE — Progress Notes (Signed)
Subjective:  Patient ID: Megan Duran, female    DOB: 12/12/1980  Age: 42 y.o. MRN: [id number]  CC: Annual Exam, Hypertension, and Diabetes   HPI Megan KernsJennifer L Colquitt presents for a CPX and f/up --   She ran out of rybelsus 3 weeks ago.  She is currently being treated for acute sinusitis and bronchitis and tells me her symptoms are improving.  She had wheezing and has completed a course of steroids.  She denies cough, chest pain, shortness of breath, diaphoresis, or edema.  Outpatient Medications Prior to Visit  Medication Sig Dispense Refill   Budeson-Glycopyrrol-Formoterol (BREZTRI AEROSPHERE) 160-9-4.8 MCG/ACT AERO Inhale 2 puffs into the lungs in the morning and at bedtime. 10.7 g 5   cetirizine (ZYRTEC ALLERGY) 10 MG tablet Take 1 tablet (10 mg total) by mouth daily. 90 tablet 1   Cholecalciferol (VITAMIN D3) POWD Take by mouth.     diclofenac sodium (VOLTAREN) 1 % GEL Apply 4 g topically 4 (four) times daily. 150 g 1   fluconazole (DIFLUCAN) 150 MG tablet Take 150 mg by mouth as needed.     fluticasone (FLONASE) 50 MCG/ACT nasal spray Place 2 sprays into both nostrils daily. 16 g 6   levalbuterol (XOPENEX HFA) 45 MCG/ACT inhaler Inhale 2 puffs into the lungs every 4 to 6 hours as needed for cough or wheeze. 15 g 0   levonorgestrel (MIRENA, 52 MG,) 20 MCG/24HR IUD Mirena 20 mcg/24 hours (6 yrs) 52 mg intrauterine device  Take 1 device by intrauterine route.     loratadine (CLARITIN) 10 MG tablet Take 10 mg by mouth daily.     methocarbamol (ROBAXIN) 500 MG tablet Take 500 mg by mouth every 6 (six) hours as needed.     Multiple Vitamin (MULTIVITAMIN PO) Take by mouth.     omeprazole (PRILOSEC) 40 MG capsule Take 1 capsule (40 mg total) by mouth 2 (two) times daily. 60 capsule 2   traZODone (DESYREL) 100 MG tablet Take 1 tablet (100 mg total) by mouth at bedtime as needed for sleep. 30 tablet 5   VITAMIN D PO Take by mouth daily.     benzonatate (TESSALON PERLES) 100 MG capsule Take  1 capsule (100 mg total) by mouth 3 (three) times daily as needed. 20 capsule 0   HYDROcodone-acetaminophen (NORCO/VICODIN) 5-325 MG tablet Take 1 tablet by mouth every 4 (four) hours as needed. 10 tablet 0   magnesium 30 MG tablet Take 30 mg by mouth daily.     Magnesium Gluconate 550 MG TABS Take by mouth.     meclizine (ANTIVERT) 12.5 MG tablet Take 1/2- 1 tablet by mouth every 6 hours if needed for vertigo. 45 tablet 1   methylPREDNISolone (MEDROL DOSEPAK) 4 MG TBPK tablet Take 4 mg by mouth daily.     nebivolol (BYSTOLIC) 5 MG tablet Take 1 tablet (5 mg total) by mouth daily. 90 tablet 0   olmesartan (BENICAR) 20 MG tablet Take 1 tablet (20 mg total) by mouth daily. 90 tablet 0   ondansetron (ZOFRAN) 4 MG tablet Take 1 tablet (4 mg total) by mouth every 8 (eight) hours as needed for nausea or vomiting. 10 tablet 0   ondansetron (ZOFRAN-ODT) 4 MG disintegrating tablet Take 1 tablet (4 mg total) by mouth every 8 (eight) hours as needed for nausea or vomiting. 20 tablet 0   Pediatric Multivit-Minerals (VITALETS CHILDRENS) CHEW Chew by mouth.     Semaglutide (RYBELSUS) 14 MG TABS Take 1 tablet (14 mg  total) by mouth daily. 90 tablet 0   gabapentin (NEURONTIN) 400 MG capsule Take 1 capsule (400 mg total) by mouth 3 (three) times daily. 270 capsule 0   No facility-administered medications prior to visit.    ROS Review of Systems  Constitutional:  Positive for unexpected weight change (wt gain). Negative for diaphoresis and fatigue.  HENT: Negative.    Eyes: Negative.   Respiratory:  Negative for cough, chest tightness, shortness of breath and wheezing.   Cardiovascular:  Negative for chest pain, palpitations and leg swelling.  Gastrointestinal:  Negative for abdominal pain, diarrhea, nausea and vomiting.  Endocrine: Negative.   Genitourinary: Negative.  Negative for difficulty urinating.  Musculoskeletal:  Negative for arthralgias and myalgias.  Skin: Negative.   Neurological:  Negative  for dizziness and weakness.  Hematological:  Negative for adenopathy. Does not bruise/bleed easily.  Psychiatric/Behavioral: Negative.      Objective:  BP (!) 142/86 (BP Location: Right Arm, Patient Position: Sitting, Cuff Size: Large)   Pulse (!) 105   Temp 98.5 F (36.9 C) (Oral)   Resp 16   Ht 5\' 5"  (1.651 m)   Wt (!) 322 lb (146.1 kg)   SpO2 97%   BMI 53.58 kg/m   BP Readings from Last 3 Encounters:  08/22/22 (!) 142/86  06/19/22 128/87  02/28/22 118/72    Wt Readings from Last 3 Encounters:  08/22/22 (!) 322 lb (146.1 kg)  06/19/22 (!) 316 lb (143.3 kg)  02/28/22 (!) 318 lb 3.2 oz (144.3 kg)    Physical Exam Vitals reviewed.  Constitutional:      General: She is not in acute distress.    Appearance: She is obese. She is not ill-appearing, toxic-appearing or diaphoretic.  HENT:     Nose: Nose normal.     Mouth/Throat:     Mouth: Mucous membranes are moist.  Eyes:     General: No scleral icterus.    Conjunctiva/sclera: Conjunctivae normal.  Cardiovascular:     Rate and Rhythm: Regular rhythm. Tachycardia present.     Heart sounds: Normal heart sounds, S1 normal and S2 normal. No murmur heard.    No gallop.     Comments: EKG- ST, 101 bpm No LVH or Q waves Pulmonary:     Effort: Pulmonary effort is normal.     Breath sounds: No stridor. No wheezing, rhonchi or rales.  Abdominal:     General: Abdomen is protuberant. There is no distension.     Palpations: There is no hepatomegaly, splenomegaly or mass.     Tenderness: There is no abdominal tenderness. There is no guarding.     Hernia: No hernia is present.  Musculoskeletal:        General: Normal range of motion.     Cervical back: Neck supple.     Right lower leg: No edema.     Left lower leg: No edema.  Lymphadenopathy:     Cervical: No cervical adenopathy.  Skin:    General: Skin is warm and dry.     Coloration: Skin is not pale.  Neurological:     General: No focal deficit present.     Mental  Status: She is alert. Mental status is at baseline.  Psychiatric:        Mood and Affect: Mood normal.        Behavior: Behavior normal.     Lab Results  Component Value Date   WBC 14.6 (H) 08/22/2022   HGB 13.4 08/22/2022   HCT  41.2 08/22/2022   PLT 368.0 08/22/2022   GLUCOSE 118 (H) 08/22/2022   CHOL 288 (A) 11/19/2021   TRIG 118 11/19/2021   HDL 51 11/19/2021   LDLCALC 216 11/19/2021   ALT 27 08/22/2022   AST 16 08/22/2022   NA 135 08/22/2022   K 4.5 08/22/2022   CL 100 08/22/2022   CREATININE 0.90 08/22/2022   BUN 17 08/22/2022   CO2 26 08/22/2022   TSH 0.36 08/22/2022   HGBA1C 6.2 08/22/2022   MICROALBUR 8.1 (H) 08/22/2022    DG Ankle Complete Right  Result Date: 06/19/2022 CLINICAL DATA:  Injury EXAM: RIGHT FOOT COMPLETE - 3 VIEW; RIGHT ANKLE - COMPLETE 3 VIEW COMPARISON:  None Available. FINDINGS: Nondisplaced fracture of the base of the fifth metatarsal. No evidence of arthropathy or other focal bone abnormality. Soft tissue swelling of the midfoot. IMPRESSION: Nondisplaced fracture of the base of the fifth metatarsal. Electronically Signed   By: Allegra Lai M.D.   On: 06/19/2022 13:35   DG Foot Complete Right  Result Date: 06/19/2022 CLINICAL DATA:  Injury EXAM: RIGHT FOOT COMPLETE - 3 VIEW; RIGHT ANKLE - COMPLETE 3 VIEW COMPARISON:  None Available. FINDINGS: Nondisplaced fracture of the base of the fifth metatarsal. No evidence of arthropathy or other focal bone abnormality. Soft tissue swelling of the midfoot. IMPRESSION: Nondisplaced fracture of the base of the fifth metatarsal. Electronically Signed   By: Allegra Lai M.D.   On: 06/19/2022 13:35    Assessment & Plan:   Encounter for general adult medical examination with abnormal findings- Exam completed, labs reviewed, vaccines reviewed and updated, cancer screenings are up-to-date, patient education was given.  Iron deficiency -     CBC with Differential/Platelet; Future  LVH (left ventricular  hypertrophy) due to hypertensive disease, without heart failure- The LVH has resolved.  Will continue to control her blood pressure and heart rate. -     Basic metabolic panel; Future -     CBC with Differential/Platelet; Future -     EKG 12-Lead  Morbid obesity with BMI of 40.0-44.9, adult- Labs are negative for secondary causes. -     Hemoglobin A1c; Future -     Hepatic function panel; Future -     TSH; Future  Type 2 diabetes mellitus with other specified complication, without long-term current use of insulin- Will treat with a GLP/GIP agonist. -     Hemoglobin A1c; Future -     Urinalysis, Routine w reflex microscopic; Future -     Basic metabolic panel; Future -     Microalbumin / creatinine urine ratio; Future -     HM Diabetes Foot Exam -     Ambulatory referral to Ophthalmology -     Tirzepatide; Inject 2.5 mg into the skin once a week.  Dispense: 2 mL; Refill: 0  Need for vaccination -     Pneumococcal conjugate vaccine 20-valent  Hypertension associated with diabetes  Primary hypertension -     Nebivolol HCl; Take 1 tablet (5 mg total) by mouth daily.  Dispense: 90 tablet; Refill: 0 -     Olmesartan Medoxomil; Take 1 tablet (20 mg total) by mouth daily.  Dispense: 90 tablet; Refill: 0  Tachycardia -     Nebivolol HCl; Take 1 tablet (5 mg total) by mouth daily.  Dispense: 90 tablet; Refill: 0     Follow-up: Return in about 6 months (around 02/21/2023).  Sanda Linger, MD

## 2022-08-23 ENCOUNTER — Other Ambulatory Visit: Payer: Self-pay

## 2022-08-23 ENCOUNTER — Other Ambulatory Visit: Payer: Self-pay | Admitting: Primary Care

## 2022-08-23 ENCOUNTER — Other Ambulatory Visit: Payer: Self-pay | Admitting: Acute Care

## 2022-08-23 ENCOUNTER — Other Ambulatory Visit (HOSPITAL_COMMUNITY): Payer: Self-pay

## 2022-08-23 DIAGNOSIS — G4733 Obstructive sleep apnea (adult) (pediatric): Secondary | ICD-10-CM | POA: Diagnosis not present

## 2022-08-23 DIAGNOSIS — G4700 Insomnia, unspecified: Secondary | ICD-10-CM

## 2022-08-23 DIAGNOSIS — E119 Type 2 diabetes mellitus without complications: Secondary | ICD-10-CM | POA: Insufficient documentation

## 2022-08-23 DIAGNOSIS — I152 Hypertension secondary to endocrine disorders: Secondary | ICD-10-CM | POA: Insufficient documentation

## 2022-08-23 MED ORDER — TRAZODONE HCL 100 MG PO TABS
100.0000 mg | ORAL_TABLET | Freq: Every day | ORAL | 0 refills | Status: DC
  Filled 2022-08-23: qty 30, 30d supply, fill #0

## 2022-08-23 MED ORDER — OLMESARTAN MEDOXOMIL 20 MG PO TABS
20.0000 mg | ORAL_TABLET | Freq: Every day | ORAL | 0 refills | Status: DC
  Filled 2022-08-23: qty 90, 90d supply, fill #0

## 2022-08-23 MED ORDER — NEBIVOLOL HCL 5 MG PO TABS
5.0000 mg | ORAL_TABLET | Freq: Every day | ORAL | 0 refills | Status: DC
  Filled 2022-08-23: qty 90, 90d supply, fill #0

## 2022-08-23 NOTE — Progress Notes (Signed)
Pt requested refill on Trazodone. Last OV was 02/2022. She is due for a 6 month follow up OV , which should be this month. It is not scheduled. I have refilled x 30 days. She needs to be seen in the office by [facility] NP or Dr. Craige Cotta to get this refilled again based on what Beth specified in her last OV note for a 6 month follow up. Please schedule OV . Thanks

## 2022-08-24 ENCOUNTER — Other Ambulatory Visit (HOSPITAL_COMMUNITY): Payer: Self-pay

## 2022-08-26 ENCOUNTER — Other Ambulatory Visit: Payer: Self-pay

## 2022-09-05 ENCOUNTER — Telehealth: Payer: Commercial Managed Care - PPO | Admitting: Physician Assistant

## 2022-09-05 DIAGNOSIS — B9689 Other specified bacterial agents as the cause of diseases classified elsewhere: Secondary | ICD-10-CM | POA: Diagnosis not present

## 2022-09-05 DIAGNOSIS — J019 Acute sinusitis, unspecified: Secondary | ICD-10-CM

## 2022-09-05 MED ORDER — AMOXICILLIN-POT CLAVULANATE 875-125 MG PO TABS: 1.0000 | ORAL_TABLET | Freq: Two times a day (BID) | ORAL | 0 refills | Status: DC

## 2022-09-05 MED ORDER — ONDANSETRON 4 MG PO TBDP: 4.0000 mg | ORAL_TABLET | Freq: Three times a day (TID) | ORAL | 0 refills | Status: DC | PRN

## 2022-09-05 NOTE — Progress Notes (Signed)
E-Visit for Sinus Problems  We are sorry that you are not feeling well.  Here is how we plan to help!  Based on what you have shared with me it looks like you have sinusitis.  Sinusitis is inflammation and infection in the sinus cavities of the head.  Based on your presentation I believe you most likely have Acute Bacterial Sinusitis.  This is an infection caused by bacteria and is treated with antibiotics. I have prescribed Augmentin /125mg  one tablet twice daily with food, for 7 days. You may use an oral decongestant such as Mucinex D or if you have glaucoma or high blood pressure use plain Mucinex. Saline nasal spray help and can safely be used as often as needed for congestion.  If you develop worsening sinus pain, fever or notice severe headache and vision changes, or if symptoms are not better after completion of antibiotic, please schedule an appointment with a health care provider.    Sinus infections are not as easily transmitted as other respiratory infection, however we still recommend that you avoid close contact with loved ones, especially the very young and elderly.  Remember to wash your hands thoroughly throughout the day as this is the number one way to prevent the spread of infection!  I have sent in a script for Zofran in case needed for nausea from drainage, etc.   Home Care: Only take medications as instructed by your medical team. Complete the entire course of an antibiotic. Do not take these medications with alcohol. A steam or ultrasonic humidifier can help congestion.  You can place a towel over your head and breathe in the steam from hot water coming from a faucet. Avoid close contacts especially the very young and the elderly. Cover your mouth when you cough or sneeze. Always remember to wash your hands.  Get Help Right Away If: You develop worsening fever or sinus pain. You develop a severe head ache or visual changes. Your symptoms persist after you have  completed your treatment plan.  Make sure you Understand these instructions. Will watch your condition. Will get help right away if you are not doing well or get worse.  Thank you for choosing an e-visit.  Your e-visit answers were reviewed by a board certified advanced clinical practitioner to complete your personal care plan. Depending upon the condition, your plan could have included both over the counter or prescription medications.  Please review your pharmacy choice. Make sure the pharmacy is open so you can pick up prescription now. If there is a problem, you may contact your provider through [employer] and have the prescription routed to another pharmacy.  Your safety is important to [place]. If you have drug allergies check your prescription carefully.   For the next 24 hours you can use MyChart to ask questions about today's visit, request a non-urgent call back, or ask for a work or school excuse. You will get an email in the next two days asking about your experience. I hope that your e-visit has been valuable and will speed your recovery.

## 2022-09-05 NOTE — Progress Notes (Signed)
I have spent 5 minutes in review of e-visit questionnaire, review and updating patient chart, medical decision making and response to patient.   Aldrin Engelhard Cody Norvin Ohlin, PA-C    

## 2022-09-09 DIAGNOSIS — S92354D Nondisplaced fracture of fifth metatarsal bone, right foot, subsequent encounter for fracture with routine healing: Secondary | ICD-10-CM | POA: Diagnosis not present

## 2022-09-13 ENCOUNTER — Other Ambulatory Visit (HOSPITAL_COMMUNITY): Payer: Self-pay

## 2022-09-18 ENCOUNTER — Encounter: Payer: Self-pay | Admitting: Internal Medicine

## 2022-09-18 ENCOUNTER — Other Ambulatory Visit: Payer: Self-pay | Admitting: Acute Care

## 2022-09-18 ENCOUNTER — Other Ambulatory Visit: Payer: Self-pay | Admitting: Internal Medicine

## 2022-09-18 ENCOUNTER — Other Ambulatory Visit (HOSPITAL_COMMUNITY): Payer: Self-pay

## 2022-09-18 DIAGNOSIS — G4700 Insomnia, unspecified: Secondary | ICD-10-CM

## 2022-09-18 DIAGNOSIS — E1169 Type 2 diabetes mellitus with other specified complication: Secondary | ICD-10-CM

## 2022-09-18 MED ORDER — TIRZEPATIDE 5 MG/0.5ML ~~LOC~~ SOAJ
5.0000 mg | SUBCUTANEOUS | 0 refills | Status: DC
  Filled 2022-09-18: qty 2, 28d supply, fill #0

## 2022-09-18 NOTE — Telephone Encounter (Signed)
Please advise on refill request

## 2022-09-19 ENCOUNTER — Other Ambulatory Visit: Payer: Self-pay

## 2022-09-19 ENCOUNTER — Other Ambulatory Visit (HOSPITAL_COMMUNITY): Payer: Self-pay

## 2022-09-19 MED ORDER — TRAZODONE HCL 100 MG PO TABS
100.0000 mg | ORAL_TABLET | Freq: Every day | ORAL | 0 refills | Status: DC
  Filled 2022-09-19: qty 30, 30d supply, fill #0

## 2022-09-19 NOTE — Telephone Encounter (Signed)
OK 

## 2022-09-22 DIAGNOSIS — G4733 Obstructive sleep apnea (adult) (pediatric): Secondary | ICD-10-CM | POA: Diagnosis not present

## 2022-09-23 ENCOUNTER — Other Ambulatory Visit (HOSPITAL_COMMUNITY): Payer: Self-pay

## 2022-09-24 ENCOUNTER — Other Ambulatory Visit: Payer: Self-pay

## 2022-10-09 DIAGNOSIS — S92354D Nondisplaced fracture of fifth metatarsal bone, right foot, subsequent encounter for fracture with routine healing: Secondary | ICD-10-CM | POA: Diagnosis not present

## 2022-10-09 DIAGNOSIS — S93492A Sprain of other ligament of left ankle, initial encounter: Secondary | ICD-10-CM | POA: Diagnosis not present

## 2022-10-18 ENCOUNTER — Encounter: Payer: Self-pay | Admitting: Internal Medicine

## 2022-10-20 ENCOUNTER — Other Ambulatory Visit: Payer: Self-pay | Admitting: Primary Care

## 2022-10-20 DIAGNOSIS — G4700 Insomnia, unspecified: Secondary | ICD-10-CM

## 2022-10-21 ENCOUNTER — Other Ambulatory Visit: Payer: Self-pay

## 2022-10-21 ENCOUNTER — Other Ambulatory Visit: Payer: Self-pay | Admitting: Internal Medicine

## 2022-10-21 ENCOUNTER — Other Ambulatory Visit (HOSPITAL_COMMUNITY): Payer: Self-pay

## 2022-10-21 DIAGNOSIS — E1169 Type 2 diabetes mellitus with other specified complication: Secondary | ICD-10-CM

## 2022-10-21 MED ORDER — TIRZEPATIDE 7.5 MG/0.5ML ~~LOC~~ SOAJ
7.5000 mg | SUBCUTANEOUS | 0 refills | Status: DC
Start: 2022-10-21 — End: 2022-12-12
  Filled 2022-10-21: qty 2, 28d supply, fill #0
  Filled 2022-11-14: qty 2, 28d supply, fill #1

## 2022-10-22 DIAGNOSIS — G4733 Obstructive sleep apnea (adult) (pediatric): Secondary | ICD-10-CM | POA: Diagnosis not present

## 2022-10-24 ENCOUNTER — Other Ambulatory Visit: Payer: Self-pay | Admitting: Primary Care

## 2022-10-28 ENCOUNTER — Other Ambulatory Visit: Payer: Self-pay

## 2022-10-28 ENCOUNTER — Other Ambulatory Visit (HOSPITAL_COMMUNITY): Payer: Self-pay

## 2022-10-28 MED ORDER — TRAZODONE HCL 100 MG PO TABS
100.0000 mg | ORAL_TABLET | Freq: Every evening | ORAL | 5 refills | Status: DC | PRN
  Filled 2022-10-28: qty 30, 30d supply, fill #0
  Filled 2022-11-23: qty 30, 30d supply, fill #1
  Filled 2022-12-18: qty 30, 30d supply, fill #2
  Filled 2023-01-17: qty 30, 30d supply, fill #3
  Filled 2023-02-15: qty 30, 30d supply, fill #4
  Filled 2023-03-17: qty 30, 30d supply, fill #5

## 2022-10-28 NOTE — Telephone Encounter (Signed)
That is fine 

## 2022-11-06 DIAGNOSIS — S92354D Nondisplaced fracture of fifth metatarsal bone, right foot, subsequent encounter for fracture with routine healing: Secondary | ICD-10-CM | POA: Diagnosis not present

## 2022-11-14 ENCOUNTER — Other Ambulatory Visit: Payer: Self-pay | Admitting: Primary Care

## 2022-11-14 DIAGNOSIS — G4700 Insomnia, unspecified: Secondary | ICD-10-CM

## 2022-11-15 ENCOUNTER — Other Ambulatory Visit (HOSPITAL_COMMUNITY): Payer: Self-pay

## 2022-11-15 ENCOUNTER — Other Ambulatory Visit: Payer: Self-pay

## 2022-11-16 ENCOUNTER — Telehealth: Payer: Commercial Managed Care - PPO | Admitting: Nurse Practitioner

## 2022-11-16 DIAGNOSIS — B9689 Other specified bacterial agents as the cause of diseases classified elsewhere: Secondary | ICD-10-CM

## 2022-11-16 MED ORDER — LEVOFLOXACIN 500 MG PO TABS: 500.0000 mg | ORAL_TABLET | Freq: Every day | ORAL | 0 refills | Status: AC

## 2022-11-16 NOTE — Progress Notes (Signed)
E-Visit for Sinus Problems  We are sorry that you are not feeling well.  Here is how we plan to help!  Based on what you have shared with me it looks like you have sinusitis.  Sinusitis is inflammation and infection in the sinus cavities of the head.  Based on your presentation I believe you most likely have Acute Bacterial Sinusitis.  This is an infection caused by bacteria and is treated with antibiotics. I have prescribed Levofloxicin 500mg  by mouth once daily for 7 days. You may use an oral decongestant such as Mucinex D or if you have glaucoma or high blood pressure use plain Mucinex. Saline nasal spray help and can safely be used as often as needed for congestion.  If you develop worsening sinus pain, fever or notice severe headache and vision changes, or if symptoms are not better after completion of antibiotic, please schedule an appointment with a health care provider.    Sinus infections are not as easily transmitted as other respiratory infection, however we still recommend that you avoid close contact with loved ones, especially the very young and elderly.  Remember to wash your hands thoroughly throughout the day as this is the number one way to prevent the spread of infection!  Home Care: Only take medications as instructed by your medical team. Complete the entire course of an antibiotic. Do not take these medications with alcohol. A steam or ultrasonic humidifier can help congestion.  You can place a towel over your head and breathe in the steam from hot water coming from a faucet. Avoid close contacts especially the very young and the elderly. Cover your mouth when you cough or sneeze. Always remember to wash your hands.  Get Help Right Away If: You develop worsening fever or sinus pain. You develop a severe head ache or visual changes. Your symptoms persist after you have completed your treatment plan.  Make sure you Understand these instructions. Will watch your  condition. Will get help right away if you are not doing well or get worse.  Thank you for choosing an e-visit.  Your e-visit answers were reviewed by a board certified advanced clinical practitioner to complete your personal care plan. Depending upon the condition, your plan could have included both over the counter or prescription medications.  Please review your pharmacy choice. Make sure the pharmacy is open so you can pick up prescription now. If there is a problem, you may contact your provider through [employer] and have the prescription routed to another pharmacy.  Your safety is important to [place]. If you have drug allergies check your prescription carefully.   For the next 24 hours you can use MyChart to ask questions about today's visit, request a non-urgent call back, or ask for a work or school excuse. You will get an email in the next two days asking about your experience. I hope that your e-visit has been valuable and will speed your recovery.   Mary-Margaret Daphine Deutscher, FNP   5-10 minutes spent reviewing and documenting in chart.

## 2022-11-19 ENCOUNTER — Other Ambulatory Visit: Payer: Self-pay

## 2022-11-19 ENCOUNTER — Other Ambulatory Visit (HOSPITAL_COMMUNITY): Payer: Self-pay

## 2022-11-19 ENCOUNTER — Other Ambulatory Visit: Payer: Self-pay | Admitting: Internal Medicine

## 2022-11-19 DIAGNOSIS — I10 Essential (primary) hypertension: Secondary | ICD-10-CM

## 2022-11-19 DIAGNOSIS — R000 Tachycardia, unspecified: Secondary | ICD-10-CM

## 2022-11-19 MED ORDER — NEBIVOLOL HCL 5 MG PO TABS
5.0000 mg | ORAL_TABLET | Freq: Every day | ORAL | 0 refills | Status: DC
Start: 2022-11-19 — End: 2023-02-18
  Filled 2022-11-19: qty 90, 90d supply, fill #0

## 2022-11-23 ENCOUNTER — Other Ambulatory Visit (HOSPITAL_COMMUNITY): Payer: Self-pay

## 2022-11-24 ENCOUNTER — Other Ambulatory Visit: Payer: Self-pay | Admitting: Internal Medicine

## 2022-11-24 DIAGNOSIS — I10 Essential (primary) hypertension: Secondary | ICD-10-CM

## 2022-11-24 MED ORDER — OLMESARTAN MEDOXOMIL 20 MG PO TABS
20.0000 mg | ORAL_TABLET | Freq: Every day | ORAL | 0 refills | Status: DC
Start: 2022-11-24 — End: 2023-02-18
  Filled 2022-11-24: qty 90, 90d supply, fill #0

## 2022-11-25 ENCOUNTER — Other Ambulatory Visit: Payer: Self-pay

## 2022-11-29 ENCOUNTER — Other Ambulatory Visit (HOSPITAL_COMMUNITY): Payer: Self-pay

## 2022-12-04 DIAGNOSIS — S92354D Nondisplaced fracture of fifth metatarsal bone, right foot, subsequent encounter for fracture with routine healing: Secondary | ICD-10-CM | POA: Diagnosis not present

## 2022-12-12 ENCOUNTER — Other Ambulatory Visit: Payer: Self-pay

## 2022-12-12 ENCOUNTER — Encounter: Payer: Self-pay | Admitting: Internal Medicine

## 2022-12-12 ENCOUNTER — Other Ambulatory Visit (HOSPITAL_COMMUNITY): Payer: Self-pay

## 2022-12-12 ENCOUNTER — Other Ambulatory Visit: Payer: Self-pay | Admitting: Internal Medicine

## 2022-12-12 ENCOUNTER — Encounter: Payer: Self-pay | Admitting: Pharmacist

## 2022-12-12 DIAGNOSIS — E1169 Type 2 diabetes mellitus with other specified complication: Secondary | ICD-10-CM

## 2022-12-12 MED ORDER — TIRZEPATIDE 10 MG/0.5ML ~~LOC~~ SOAJ
10.0000 mg | SUBCUTANEOUS | 1 refills | Status: DC
Start: 2022-12-12 — End: 2023-03-03
  Filled 2022-12-12: qty 2, 28d supply, fill #0
  Filled 2023-01-04: qty 2, 28d supply, fill #1
  Filled 2023-02-01: qty 2, 28d supply, fill #2

## 2022-12-18 ENCOUNTER — Other Ambulatory Visit (HOSPITAL_COMMUNITY): Payer: Self-pay

## 2023-01-04 ENCOUNTER — Other Ambulatory Visit (HOSPITAL_COMMUNITY): Payer: Self-pay

## 2023-01-15 DIAGNOSIS — S92354D Nondisplaced fracture of fifth metatarsal bone, right foot, subsequent encounter for fracture with routine healing: Secondary | ICD-10-CM | POA: Diagnosis not present

## 2023-01-18 ENCOUNTER — Other Ambulatory Visit (HOSPITAL_COMMUNITY): Payer: Self-pay

## 2023-01-20 DIAGNOSIS — G4733 Obstructive sleep apnea (adult) (pediatric): Secondary | ICD-10-CM | POA: Diagnosis not present

## 2023-01-29 ENCOUNTER — Telehealth: Payer: Commercial Managed Care - PPO | Admitting: Physician Assistant

## 2023-01-29 DIAGNOSIS — B9689 Other specified bacterial agents as the cause of diseases classified elsewhere: Secondary | ICD-10-CM | POA: Diagnosis not present

## 2023-01-29 DIAGNOSIS — J019 Acute sinusitis, unspecified: Secondary | ICD-10-CM | POA: Diagnosis not present

## 2023-01-29 MED ORDER — ONDANSETRON 4 MG PO TBDP
4.0000 mg | ORAL_TABLET | Freq: Three times a day (TID) | ORAL | 0 refills | Status: DC | PRN
Start: 2023-01-29 — End: 2023-03-01

## 2023-01-29 MED ORDER — AMOXICILLIN-POT CLAVULANATE 875-125 MG PO TABS
1.0000 | ORAL_TABLET | Freq: Two times a day (BID) | ORAL | 0 refills | Status: DC
Start: 2023-01-29 — End: 2023-02-27

## 2023-01-29 NOTE — Progress Notes (Signed)
E-Visit for Sinus Problems  We are sorry that you are not feeling well.  Here is how we plan to help!  Based on what you have shared with me it looks like you have sinusitis.  Sinusitis is inflammation and infection in the sinus cavities of the head.  Based on your presentation I believe you most likely have Acute Bacterial Sinusitis.  This is an infection caused by bacteria and is treated with antibiotics. I have prescribed Augmentin 875mg /125mg  one tablet twice daily with food, for 7 days. You may use an oral decongestant such as Mucinex D or if you have glaucoma or high blood pressure use plain Mucinex. Saline nasal spray help and can safely be used as often as needed for congestion.  If you develop worsening sinus pain, fever or notice severe headache and vision changes, or if symptoms are not better after completion of antibiotic, please schedule an appointment with a health care provider.    I have also sent in a script for zofran for nausea.   Sinus infections are not as easily transmitted as other respiratory infection, however we still recommend that you avoid close contact with loved ones, especially the very young and elderly.  Remember to wash your hands thoroughly throughout the day as this is the number one way to prevent the spread of infection!  Home Care: Only take medications as instructed by your medical team. Complete the entire course of an antibiotic. Do not take these medications with alcohol. A steam or ultrasonic humidifier can help congestion.  You can place a towel over your head and breathe in the steam from hot water coming from a faucet. Avoid close contacts especially the very young and the elderly. Cover your mouth when you cough or sneeze. Always remember to wash your hands.  Get Help Right Away If: You develop worsening fever or sinus pain. You develop a severe head ache or visual changes. Your symptoms persist after you have completed your treatment  plan.  Make sure you Understand these instructions. Will watch your condition. Will get help right away if you are not doing well or get worse.  Thank you for choosing an e-visit.  Your e-visit answers were reviewed by a board certified advanced clinical practitioner to complete your personal care plan. Depending upon the condition, your plan could have included both over the counter or prescription medications.  Please review your pharmacy choice. Make sure the pharmacy is open so you can pick up prescription now. If there is a problem, you may contact your provider through [employer] and have the prescription routed to another pharmacy.  Your safety is important to [place]. If you have drug allergies check your prescription carefully.   For the next 24 hours you can use MyChart to ask questions about today's visit, request a non-urgent call back, or ask for a work or school excuse. You will get an email in the next two days asking about your experience. I hope that your e-visit has been valuable and will speed your recovery.

## 2023-01-29 NOTE — Progress Notes (Signed)
I have spent 5 minutes in review of e-visit questionnaire, review and updating patient chart, medical decision making and response to patient.   Mia Milan Cody Jacklynn Dehaas, PA-C    

## 2023-02-01 ENCOUNTER — Other Ambulatory Visit (HOSPITAL_COMMUNITY): Payer: Self-pay

## 2023-02-15 ENCOUNTER — Other Ambulatory Visit (HOSPITAL_COMMUNITY): Payer: Self-pay

## 2023-02-18 ENCOUNTER — Other Ambulatory Visit: Payer: Self-pay

## 2023-02-18 ENCOUNTER — Other Ambulatory Visit: Payer: Self-pay | Admitting: Internal Medicine

## 2023-02-18 DIAGNOSIS — I10 Essential (primary) hypertension: Secondary | ICD-10-CM

## 2023-02-18 DIAGNOSIS — R000 Tachycardia, unspecified: Secondary | ICD-10-CM

## 2023-02-18 MED ORDER — NEBIVOLOL HCL 5 MG PO TABS
5.0000 mg | ORAL_TABLET | Freq: Every day | ORAL | 0 refills | Status: DC
Start: 2023-02-18 — End: 2023-05-13
  Filled 2023-02-18: qty 90, 90d supply, fill #0

## 2023-02-18 MED ORDER — OLMESARTAN MEDOXOMIL 20 MG PO TABS
20.0000 mg | ORAL_TABLET | Freq: Every day | ORAL | 0 refills | Status: DC
Start: 2023-02-18 — End: 2023-05-17
  Filled 2023-02-18: qty 90, 90d supply, fill #0

## 2023-02-20 ENCOUNTER — Ambulatory Visit: Payer: Commercial Managed Care - PPO | Admitting: Internal Medicine

## 2023-02-20 ENCOUNTER — Encounter: Payer: Self-pay | Admitting: Internal Medicine

## 2023-02-20 VITALS — BP 132/70 | HR 90 | Temp 98.0°F | Resp 16 | Ht 65.0 in | Wt 322.8 lb

## 2023-02-20 DIAGNOSIS — M19041 Primary osteoarthritis, right hand: Secondary | ICD-10-CM | POA: Insufficient documentation

## 2023-02-20 DIAGNOSIS — M19042 Primary osteoarthritis, left hand: Secondary | ICD-10-CM

## 2023-02-20 DIAGNOSIS — E1169 Type 2 diabetes mellitus with other specified complication: Secondary | ICD-10-CM

## 2023-02-20 DIAGNOSIS — A6923 Arthritis due to Lyme disease: Secondary | ICD-10-CM | POA: Diagnosis not present

## 2023-02-20 DIAGNOSIS — E611 Iron deficiency: Secondary | ICD-10-CM

## 2023-02-20 DIAGNOSIS — R7989 Other specified abnormal findings of blood chemistry: Secondary | ICD-10-CM | POA: Diagnosis not present

## 2023-02-20 DIAGNOSIS — I10 Essential (primary) hypertension: Secondary | ICD-10-CM | POA: Diagnosis not present

## 2023-02-20 DIAGNOSIS — Z7985 Long-term (current) use of injectable non-insulin antidiabetic drugs: Secondary | ICD-10-CM | POA: Diagnosis not present

## 2023-02-20 LAB — CBC WITH DIFFERENTIAL/PLATELET
Basophils Absolute: 0 10*3/uL (ref 0.0–0.1)
Basophils Relative: 0.2 % (ref 0.0–3.0)
Eosinophils Absolute: 0.1 10*3/uL (ref 0.0–0.7)
Eosinophils Relative: 0.7 % (ref 0.0–5.0)
HCT: 40.6 % (ref 36.0–46.0)
Hemoglobin: 13.1 g/dL (ref 12.0–15.0)
Lymphocytes Relative: 13.7 % (ref 12.0–46.0)
Lymphs Abs: 1.4 10*3/uL (ref 0.7–4.0)
MCHC: 32.2 g/dL (ref 30.0–36.0)
MCV: 92.4 fL (ref 78.0–100.0)
Monocytes Absolute: 0.7 10*3/uL (ref 0.1–1.0)
Monocytes Relative: 6.8 % (ref 3.0–12.0)
Neutro Abs: 7.8 10*3/uL — ABNORMAL HIGH (ref 1.4–7.7)
Neutrophils Relative %: 78.6 % — ABNORMAL HIGH (ref 43.0–77.0)
Platelets: 311 10*3/uL (ref 150.0–400.0)
RBC: 4.39 Mil/uL (ref 3.87–5.11)
RDW: 15.4 % (ref 11.5–15.5)
WBC: 9.9 10*3/uL (ref 4.0–10.5)

## 2023-02-20 LAB — BASIC METABOLIC PANEL
BUN: 16 mg/dL (ref 6–23)
CO2: 26 meq/L (ref 19–32)
Calcium: 9.7 mg/dL (ref 8.4–10.5)
Chloride: 102 meq/L (ref 96–112)
Creatinine, Ser: 0.87 mg/dL (ref 0.40–1.20)
GFR: 81.5 mL/min (ref >60.00)
Glucose, Bld: 115 mg/dL — ABNORMAL HIGH (ref 70–99)
Potassium: 4.1 meq/L (ref 3.5–5.1)
Sodium: 134 meq/L — ABNORMAL LOW (ref 135–145)

## 2023-02-20 LAB — HEMOGLOBIN A1C: Hgb A1c MFr Bld: 6 % (ref 4.6–6.5)

## 2023-02-20 LAB — C-REACTIVE PROTEIN: CRP: <1 mg/dL (ref 0.5–20.0)

## 2023-02-20 NOTE — Patient Instructions (Signed)
Hypertension, Adult High blood pressure (hypertension) is when the force of blood pumping through the arteries is too strong. The arteries are the blood vessels that carry blood from the heart throughout the body. Hypertension forces the heart to work harder to pump blood and may cause arteries to become narrow or stiff. Untreated or uncontrolled hypertension can lead to a heart attack, heart failure, a stroke, kidney disease, and other problems. A blood pressure reading consists of a higher number over a lower number. Ideally, your blood pressure should be below 120/80. The first ("top") number is called the systolic pressure. It is a measure of the pressure in your arteries as your heart beats. The second ("bottom") number is called the diastolic pressure. It is a measure of the pressure in your arteries as the heart relaxes. What are the causes? The exact cause of this condition is not known. There are some conditions that result in high blood pressure. What increases the risk? Certain factors may make you more likely to develop high blood pressure. Some of these risk factors are under your control, including: Smoking. Not getting enough exercise or physical activity. Being overweight. Having too much fat, sugar, calories, or salt (sodium) in your diet. Drinking too much alcohol. Other risk factors include: Having a personal history of heart disease, diabetes, high cholesterol, or kidney disease. Stress. Having a family history of high blood pressure and high cholesterol. Having obstructive sleep apnea. Age. The risk increases with age. What are the signs or symptoms? High blood pressure may not cause symptoms. Very high blood pressure (hypertensive crisis) may cause: Headache. Fast or irregular heartbeats (palpitations). Shortness of breath. Nosebleed. Nausea and vomiting. Vision changes. Severe chest pain, dizziness, and seizures. How is this diagnosed? This condition is diagnosed by  measuring your blood pressure while you are seated, with your arm resting on a flat surface, your legs uncrossed, and your feet flat on the floor. The cuff of the blood pressure monitor will be placed directly against the skin of your upper arm at the level of your heart. Blood pressure should be measured at least twice using the same arm. Certain conditions can cause a difference in blood pressure between your right and left arms. If you have a high blood pressure reading during one visit or you have normal blood pressure with other risk factors, you may be asked to: Return on a different day to have your blood pressure checked again. Monitor your blood pressure at home for 1 week or longer. If you are diagnosed with hypertension, you may have other blood or imaging tests to help your health care provider understand your overall risk for other conditions. How is this treated? This condition is treated by making healthy lifestyle changes, such as eating healthy foods, exercising more, and reducing your alcohol intake. You may be referred for counseling on a healthy diet and physical activity. Your health care provider may prescribe medicine if lifestyle changes are not enough to get your blood pressure under control and if: Your systolic blood pressure is above 130. Your diastolic blood pressure is above 80. Your personal target blood pressure may vary depending on your medical conditions, your age, and other factors. Follow these instructions at home: Eating and drinking  Eat a diet that is high in fiber and potassium, and low in sodium, added sugar, and fat. An example of this eating plan is called the DASH diet. DASH stands for Dietary Approaches to Stop Hypertension. To eat this way: Eat   plenty of fresh fruits and vegetables. Try to fill one half of your plate at each meal with fruits and vegetables. Eat whole grains, such as whole-wheat pasta, brown rice, or whole-grain bread. Fill about one  fourth of your plate with whole grains. Eat or drink low-fat dairy products, such as skim milk or low-fat yogurt. Avoid fatty cuts of meat, processed or cured meats, and poultry with skin. Fill about one fourth of your plate with lean proteins, such as fish, chicken without skin, beans, eggs, or tofu. Avoid pre-made and processed foods. These tend to be higher in sodium, added sugar, and fat. Reduce your daily sodium intake. Many people with hypertension should eat less than 1,500 mg of sodium a day. Do not drink alcohol if: Your health care provider tells you not to drink. You are pregnant, may be pregnant, or are planning to become pregnant. If you drink alcohol: Limit how much you have to: 0-1 drink a day for women. 0-2 drinks a day for men. Know how much alcohol is in your drink. In the [place] one drink equals one 12 oz bottle of beer (355 mL), one 5 oz glass of wine (148 mL), or one 1 oz glass of hard liquor (44 mL). Lifestyle  Work with your health care provider to maintain a healthy body weight or to lose weight. Ask what an ideal weight is for you. Get at least 30 minutes of exercise that causes your heart to beat faster (aerobic exercise) most days of the week. Activities may include walking, swimming, or biking. Include exercise to strengthen your muscles (resistance exercise), such as Pilates or lifting weights, as part of your weekly exercise routine. Try to do these types of exercises for 30 minutes at least 3 days a week. Do not use any products that contain nicotine or tobacco. These products include cigarettes, chewing tobacco, and vaping devices, such as e-cigarettes. If you need help quitting, ask your health care provider. Monitor your blood pressure at home as told by your health care provider. Keep all follow-up visits. This is important. Medicines Take over-the-counter and prescription medicines only as told by your health care provider. Follow directions carefully. Blood  pressure medicines must be taken as prescribed. Do not skip doses of blood pressure medicine. Doing this puts you at risk for problems and can make the medicine less effective. Ask your health care provider about side effects or reactions to medicines that you should watch for. Contact a health care provider if you: Think you are having a reaction to a medicine you are taking. Have headaches that keep coming back (recurring). Feel dizzy. Have swelling in your ankles. Have trouble with your vision. Get help right away if you: Develop a severe headache or confusion. Have unusual weakness or numbness. Feel faint. Have severe pain in your chest or abdomen. Vomit repeatedly. Have trouble breathing. These symptoms may be an emergency. Get help right away. Call 911. Do not wait to see if the symptoms will go away. Do not drive yourself to the hospital. Summary Hypertension is when the force of blood pumping through your arteries is too strong. If this condition is not controlled, it may put you at risk for serious complications. Your personal target blood pressure may vary depending on your medical conditions, your age, and other factors. For most people, a normal blood pressure is less than 120/80. Hypertension is treated with lifestyle changes, medicines, or a combination of both. Lifestyle changes include losing weight, eating a healthy,   low-sodium diet, exercising more, and limiting alcohol. This information is not intended to replace advice given to you by your health care provider. Make sure you discuss any questions you have with your health care provider. Document Revised: 03/06/2021 Document Reviewed: 03/06/2021 Elsevier Patient Education  2024 [employer]  

## 2023-02-20 NOTE — Progress Notes (Signed)
Subjective:  Patient ID: Megan Duran, female    DOB: 04-23-1981  Age: 42 y.o. MRN: [id number]  CC: Hypertension   HPI DANNAH NJOKU presents for f/up ----  Discussed the use of AI scribe software for clinical note transcription with the patient, who gave verbal consent to proceed.  History of Present Illness   The patient, with a history of hypertension, presents with concerns about recent joint pain and swelling, particularly in the fingers and knees. The symptoms started approximately three weeks ago and have been persistent. The pain is especially pronounced in the pinky fingers after a day of typing, with the fingers becoming swollen and red. The patient also reports back pain when standing for extended periods. She has been managing the pain with Aleve and Tylenol, but relief has been minimal. The patient has a family history of rheumatoid arthritis.  The patient also reports a recent weight loss, dropping from 340 to 322 pounds, attributed to increased activity following recovery from a foot injury. She denies any side effects from her current medication, Mounjaro, and reports no issues with blood pressure, dizziness, lightheadedness, headache, or blurred vision.  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 She also reports hair loss and requests a recheck of her thyroid levels, as thyroid problems run in her family.       Outpatient Medications Prior to Visit  Medication Sig Dispense Refill   Cholecalciferol (VITAMIN D3) POWD Take by mouth.     diclofenac sodium (VOLTAREN) 1 % GEL Apply 4 g topically 4 (four) times daily. 150 g 1   fluticasone (FLONASE) 50 MCG/ACT nasal spray Place 2 sprays into both nostrils daily. 16 g 6   levalbuterol (XOPENEX HFA) 45 MCG/ACT inhaler Inhale 2 puffs into the lungs every 4 to 6 hours as needed for cough or wheeze. 15 g 0   levonorgestrel (MIRENA, 52 MG,) 20 MCG/24HR IUD Mirena 20 mcg/24 hours (6 yrs)  52 mg intrauterine device  Take 1 device by intrauterine route.     loratadine (CLARITIN) 10 MG tablet Take 10 mg by mouth daily.     Multiple Vitamin (MULTIVITAMIN PO) Take by mouth.     nebivolol (BYSTOLIC) 5 MG tablet Take 1 tablet (5 mg total) by mouth daily. 90 tablet 0   olmesartan (BENICAR) 20 MG tablet Take 1 tablet (20 mg total) by mouth daily. 90 tablet 0   omeprazole (PRILOSEC) 40 MG capsule Take 1 capsule (40 mg total) by mouth 2 (two) times daily. 60 capsule 2   ondansetron (ZOFRAN-ODT) 4 MG disintegrating tablet Take 1 tablet (4 mg total) by mouth every 8 (eight) hours as needed for nausea or vomiting. 20 tablet 0   tirzepatide (MOUNJARO) 10 MG/0.5ML Pen Inject 10 mg into the skin once a week. 6 mL 1   traZODone (DESYREL) 100 MG tablet Take 1 tablet (100 mg total) by mouth at bedtime as needed for sleep. 30 tablet 5   VITAMIN D PO Take by mouth daily.     amoxicillin-clavulanate (AUGMENTIN) 875-125 MG tablet Take 1 tablet by mouth 2 (two) times daily. 14 tablet 0   Budeson-Glycopyrrol-Formoterol (BREZTRI AEROSPHERE) 160-9-4.8 MCG/ACT AERO Inhale 2 puffs into the lungs in the morning and at bedtime. (Patient not taking: Reported on 02/20/2023) 10.7 g 5   fluconazole (DIFLUCAN) 150 MG tablet Take 150 mg by mouth as needed.     traZODone (DESYREL) 100 MG tablet Take 1 tablet (100  mg total) by mouth at bedtime. 30 tablet 0   No facility-administered medications prior to visit.    ROS Review of Systems  Constitutional:  Negative for appetite change, fatigue and unexpected weight change.  HENT: Negative.    Eyes: Negative.   Respiratory: Negative.  Negative for chest tightness, shortness of breath and wheezing.   Cardiovascular:  Negative for chest pain, palpitations and leg swelling.  Gastrointestinal: Negative.  Negative for abdominal pain, constipation and vomiting.  Genitourinary: Negative.  Negative for difficulty urinating.  Musculoskeletal:  Positive for arthralgias and  joint swelling. Negative for gait problem and myalgias.  Neurological: Negative.  Negative for dizziness and weakness.  Hematological:  Negative for adenopathy. Does not bruise/bleed easily.  Psychiatric/Behavioral: Negative.      Objective:  BP 132/70 (BP Location: Left Arm, Patient Position: Sitting, Cuff Size: Normal)   Pulse 90   Temp 98 F (36.7 C) (Oral)   Resp 16   Ht 5\' 5"  (1.651 m)   Wt (!) 322 lb 12.8 oz (146.4 kg)   SpO2 97%   BMI 53.72 kg/m   BP Readings from Last 3 Encounters:  02/20/23 132/70  08/22/22 (!) 142/86  06/19/22 128/87    Wt Readings from Last 3 Encounters:  02/20/23 (!) 322 lb 12.8 oz (146.4 kg)  08/22/22 (!) 322 lb (146.1 kg)  06/19/22 (!) 316 lb (143.3 kg)    Physical Exam Vitals reviewed.  Constitutional:      Appearance: She is not ill-appearing.  HENT:     Nose: Nose normal.     Mouth/Throat:     Mouth: Mucous membranes are moist.  Eyes:     General: No scleral icterus.    Conjunctiva/sclera: Conjunctivae normal.  Cardiovascular:     Rate and Rhythm: Normal rate and regular rhythm.     Heart sounds: No murmur heard.    No gallop.  Pulmonary:     Effort: Pulmonary effort is normal.     Breath sounds: No stridor. No wheezing, rhonchi or rales.  Abdominal:     General: Abdomen is flat.     Palpations: There is no mass.     Tenderness: There is no abdominal tenderness. There is no guarding.     Hernia: No hernia is present.  Musculoskeletal:        General: Swelling present.     Cervical back: Neck supple.     Comments: Mild synovitis in the MCP and IP joints.  Lymphadenopathy:     Cervical: No cervical adenopathy.  Skin:    General: Skin is warm and dry.  Neurological:     General: No focal deficit present.     Mental Status: She is alert. Mental status is at baseline.     Lab Results  Component Value Date   WBC 9.9 02/20/2023   HGB 13.1 02/20/2023   HCT 40.6 02/20/2023   PLT 311.0 02/20/2023   GLUCOSE 115 (H)  02/20/2023   CHOL 288 (A) 11/19/2021   TRIG 118 11/19/2021   HDL 51 11/19/2021   LDLCALC 216 11/19/2021   ALT 27 08/22/2022   AST 16 08/22/2022   NA 134 (L) 02/20/2023   K 4.1 02/20/2023   CL 102 02/20/2023   CREATININE 0.87 02/20/2023   BUN 16 02/20/2023   CO2 26 02/20/2023   TSH 0.36 08/22/2022   HGBA1C 6.0 02/20/2023   MICROALBUR 8.1 (H) 08/22/2022    DG Ankle Complete Right  Result Date: 06/19/2022 CLINICAL DATA:  Injury EXAM:  RIGHT FOOT COMPLETE - 3 VIEW; RIGHT ANKLE - COMPLETE 3 VIEW COMPARISON:  None Available. FINDINGS: Nondisplaced fracture of the base of the fifth metatarsal. No evidence of arthropathy or other focal bone abnormality. Soft tissue swelling of the midfoot. IMPRESSION: Nondisplaced fracture of the base of the fifth metatarsal. Electronically Signed   By: Allegra Lai M.D.   On: 06/19/2022 13:35   DG Foot Complete Right  Result Date: 06/19/2022 CLINICAL DATA:  Injury EXAM: RIGHT FOOT COMPLETE - 3 VIEW; RIGHT ANKLE - COMPLETE 3 VIEW COMPARISON:  None Available. FINDINGS: Nondisplaced fracture of the base of the fifth metatarsal. No evidence of arthropathy or other focal bone abnormality. Soft tissue swelling of the midfoot. IMPRESSION: Nondisplaced fracture of the base of the fifth metatarsal. Electronically Signed   By: Allegra Lai M.D.   On: 06/19/2022 13:35    Assessment & Plan:  Arthritis of both hands- Labs are normal with the exception of the Lyme titers. -     C-reactive protein; Future -     Rheumatoid factor; Future -     B. burgdorfi antibodies by WB; Future -     ANA; Future -     Cyclic citrul peptide antibody, IgG; Future -     Thyroxine binding globulin; Future -     Thyroid peroxidase antibody; Future  Low TSH level- She is euthyroid. -     Thyroxine binding globulin; Future -     Thyroid peroxidase antibody; Future  Iron deficiency -     CBC with Differential/Platelet; Future  Primary - Her blood pressure is well-controlled. 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  Will treat with a 4-week course of amoxicillin.     Follow-up: Return in about 6 months (around 08/21/2023).  Sanda Linger, MD

## 2023-02-26 ENCOUNTER — Encounter: Payer: Self-pay | Admitting: Internal Medicine

## 2023-02-26 LAB — B. BURGDORFI ANTIBODIES BY WB
B burgdorferi IgG Abs (IB): NEGATIVE
B burgdorferi IgM Abs (IB): NEGATIVE
Lyme Disease 18 kD IgG: NONREACTIVE
Lyme Disease 23 kD IgG: NONREACTIVE
Lyme Disease 23 kD IgM: NONREACTIVE
Lyme Disease 28 kD IgG: NONREACTIVE
Lyme Disease 30 kD IgG: NONREACTIVE
Lyme Disease 39 kD IgG: REACTIVE — AB
Lyme Disease 39 kD IgM: NONREACTIVE
Lyme Disease 41 kD IgG: REACTIVE — AB
Lyme Disease 41 kD IgM: NONREACTIVE
Lyme Disease 45 kD IgG: NONREACTIVE
Lyme Disease 58 kD IgG: REACTIVE — AB
Lyme Disease 66 kD IgG: NONREACTIVE
Lyme Disease 93 kD IgG: REACTIVE — AB

## 2023-02-26 LAB — RHEUMATOID FACTOR: Rheumatoid fact SerPl-aCnc: <10 [IU]/mL (ref <14)

## 2023-02-26 LAB — CYCLIC CITRUL PEPTIDE ANTIBODY, IGG: Cyclic Citrullin Peptide Ab: <16 U

## 2023-02-26 LAB — THYROID PEROXIDASE ANTIBODY: Thyroperoxidase Ab SerPl-aCnc: <1 [IU]/mL (ref <9)

## 2023-02-26 LAB — THYROXINE BINDING GLOBULIN: TBG: 21.2 ug/mL (ref 13.5–30.9)

## 2023-02-26 LAB — ANA: Anti Nuclear Antibody (ANA): NEGATIVE

## 2023-02-27 ENCOUNTER — Other Ambulatory Visit (HOSPITAL_COMMUNITY): Payer: Self-pay

## 2023-02-27 ENCOUNTER — Other Ambulatory Visit: Payer: Self-pay | Admitting: Internal Medicine

## 2023-02-27 ENCOUNTER — Encounter (HOSPITAL_COMMUNITY): Payer: Self-pay

## 2023-02-27 DIAGNOSIS — A6923 Arthritis due to Lyme disease: Secondary | ICD-10-CM

## 2023-02-27 DIAGNOSIS — B9689 Other specified bacterial agents as the cause of diseases classified elsewhere: Secondary | ICD-10-CM

## 2023-02-27 MED ORDER — AMOXICILLIN 500 MG PO CAPS
500.0000 mg | ORAL_CAPSULE | Freq: Three times a day (TID) | ORAL | 0 refills | Status: AC
Start: 2023-02-27 — End: 2023-03-27
  Filled 2023-02-27: qty 84, 28d supply, fill #0

## 2023-02-27 MED ORDER — ONDANSETRON 4 MG PO TBDP
4.0000 mg | ORAL_TABLET | Freq: Three times a day (TID) | ORAL | 1 refills | Status: DC | PRN
Start: 2023-02-27 — End: 2023-06-13
  Filled 2023-02-27: qty 60, 20d supply, fill #0
  Filled 2023-04-06: qty 60, 20d supply, fill #1

## 2023-03-01 DIAGNOSIS — A6923 Arthritis due to Lyme disease: Secondary | ICD-10-CM | POA: Insufficient documentation

## 2023-03-03 ENCOUNTER — Other Ambulatory Visit (HOSPITAL_COMMUNITY): Payer: Self-pay

## 2023-03-03 ENCOUNTER — Other Ambulatory Visit: Payer: Self-pay | Admitting: Internal Medicine

## 2023-03-03 ENCOUNTER — Encounter: Payer: Self-pay | Admitting: Internal Medicine

## 2023-03-03 DIAGNOSIS — E1169 Type 2 diabetes mellitus with other specified complication: Secondary | ICD-10-CM

## 2023-03-03 MED ORDER — TIRZEPATIDE 12.5 MG/0.5ML ~~LOC~~ SOAJ
12.5000 mg | SUBCUTANEOUS | 0 refills | Status: DC
  Filled 2023-03-03: qty 2, 28d supply, fill #0
  Filled 2023-03-27: qty 2, 28d supply, fill #1

## 2023-03-04 ENCOUNTER — Other Ambulatory Visit (HOSPITAL_COMMUNITY): Payer: Self-pay

## 2023-03-07 ENCOUNTER — Other Ambulatory Visit: Payer: Self-pay | Admitting: Internal Medicine

## 2023-03-18 ENCOUNTER — Other Ambulatory Visit: Payer: Self-pay

## 2023-03-27 ENCOUNTER — Other Ambulatory Visit: Payer: Self-pay

## 2023-04-04 ENCOUNTER — Other Ambulatory Visit: Payer: Self-pay | Admitting: Internal Medicine

## 2023-04-04 DIAGNOSIS — Z1231 Encounter for screening mammogram for malignant neoplasm of breast: Secondary | ICD-10-CM

## 2023-04-07 ENCOUNTER — Other Ambulatory Visit: Payer: Self-pay

## 2023-04-09 ENCOUNTER — Telehealth: Payer: Commercial Managed Care - PPO | Admitting: Nurse Practitioner

## 2023-04-09 DIAGNOSIS — B029 Zoster without complications: Secondary | ICD-10-CM

## 2023-04-09 MED ORDER — VALACYCLOVIR HCL 1 G PO TABS: 1000.0000 mg | ORAL_TABLET | Freq: Three times a day (TID) | ORAL | 0 refills | Status: AC

## 2023-04-09 MED ORDER — GABAPENTIN 300 MG PO CAPS: 300.0000 mg | ORAL_CAPSULE | Freq: Two times a day (BID) | ORAL | 0 refills | Status: DC | PRN

## 2023-04-09 NOTE — Progress Notes (Signed)
E-visit for Shingles   We are sorry that you are not feeling well. Here is how we plan to help!  Based on what you shared with me it looks like you have shingles.  Shingles or herpes zoster, is a common infection of the nerves.  It is a painful rash caused by the herpes zoster virus.  This is the same virus that causes chickenpox.  After a person has chickenpox, the virus remains inactive in the nerve cells.  Years later, the virus can become active again and travel to the skin.  It typically will appear on one side of the face or body.  Burning or shooting pain, tingling, or itching are early signs of the infection.  Blisters typically scab over in 7 to 10 days and clear up within 2-4 weeks. Shingles is only contagious to people that have never had the chickenpox, the chickenpox vaccine, or anyone who has a compromised immune system.  You should avoid contact with these type of people until your blisters scab over.  I have prescribed Valacyclovir 1g three times daily for 7 days and also Gabapentin 300mg  twice daily as needed for pain   HOME CARE: Apply ice packs (wrapped in a thin towel), cool compresses, or soak in cool bath to help reduce pain. Use calamine lotion to calm itchy skin. Avoid scratching the rash. Avoid direct sunlight.  GET HELP RIGHT AWAY IF: Symptoms that don't away after treatment. A rash or blisters near your eye. Increased drainage, fever, or rash after treatment. Severe pain that doesn't go away.   MAKE SURE YOU   Understand these instructions. Will watch your condition. Will get help right away if you are not doing well or get worse.  Thank you for choosing an e-visit.  Your e-visit answers were reviewed by a board certified advanced clinical practitioner to complete your personal care plan. Depending upon the condition, your plan could have included both over the counter or prescription medications.  Please review your pharmacy choice. Make sure the pharmacy  is open so you can pick up prescription now. If there is a problem, you may contact your provider through [employer] and have the prescription routed to another pharmacy.  Your safety is important to [place]. If you have drug allergies check your prescription carefully.   For the next 24 hours you can use MyChart to ask questions about today's visit, request a non-urgent call back, or ask for a work or school excuse. You will get an email in the next two days asking about your experience. I hope that your e-visit has been valuable and will speed your recovery.   Meds ordered this encounter  Medications   valACYclovir (VALTREX) 1000 MG tablet    Sig: Take 1 tablet (1,000 mg total) by mouth 3 (three) times daily for 7 days.    Dispense:  21 tablet    Refill:  0   gabapentin (NEURONTIN) 300 MG capsule    Sig: Take 1 capsule (300 mg total) by mouth 2 (two) times daily as needed (pain).    Dispense:  30 capsule    Refill:  0    I spent approximately 5 minutes reviewing the patient's history, current symptoms and coordinating their care today.

## 2023-04-14 ENCOUNTER — Other Ambulatory Visit: Payer: Self-pay | Admitting: Primary Care

## 2023-04-15 ENCOUNTER — Other Ambulatory Visit (HOSPITAL_COMMUNITY): Payer: Self-pay

## 2023-04-15 MED ORDER — TRAZODONE HCL 100 MG PO TABS
100.0000 mg | ORAL_TABLET | Freq: Every evening | ORAL | 1 refills | Status: DC | PRN
  Filled 2023-04-15: qty 30, 30d supply, fill #0
  Filled 2023-05-14: qty 30, 30d supply, fill #1

## 2023-04-15 NOTE — Telephone Encounter (Signed)
Pt requesting refill lov 02/28/22

## 2023-04-21 ENCOUNTER — Encounter: Payer: Self-pay | Admitting: Internal Medicine

## 2023-04-21 DIAGNOSIS — G4733 Obstructive sleep apnea (adult) (pediatric): Secondary | ICD-10-CM | POA: Diagnosis not present

## 2023-04-28 ENCOUNTER — Other Ambulatory Visit: Payer: Self-pay | Admitting: Internal Medicine

## 2023-04-28 ENCOUNTER — Other Ambulatory Visit: Payer: Self-pay

## 2023-04-28 ENCOUNTER — Encounter: Payer: Self-pay | Admitting: Internal Medicine

## 2023-04-28 DIAGNOSIS — E1169 Type 2 diabetes mellitus with other specified complication: Secondary | ICD-10-CM

## 2023-04-28 MED ORDER — TIRZEPATIDE 15 MG/0.5ML ~~LOC~~ SOAJ
15.0000 mg | SUBCUTANEOUS | 0 refills | Status: DC
  Filled 2023-04-28: qty 2, 28d supply, fill #0
  Filled 2023-05-21: qty 2, 28d supply, fill #1
  Filled 2023-06-18: qty 2, 28d supply, fill #2

## 2023-05-04 ENCOUNTER — Telehealth: Payer: Commercial Managed Care - PPO | Admitting: Family Medicine

## 2023-05-04 DIAGNOSIS — J329 Chronic sinusitis, unspecified: Secondary | ICD-10-CM

## 2023-05-04 MED ORDER — AMOXICILLIN-POT CLAVULANATE 875-125 MG PO TABS: 1.0000 | ORAL_TABLET | Freq: Two times a day (BID) | ORAL | 0 refills | Status: DC

## 2023-05-04 MED ORDER — PREDNISONE 20 MG PO TABS: 20.0000 mg | ORAL_TABLET | Freq: Two times a day (BID) | ORAL | 0 refills | Status: AC

## 2023-05-04 NOTE — Progress Notes (Signed)
E-Visit for Sinus Problems  We are sorry that you are not feeling well.  Here is how we plan to help!  Based on what you have shared with me it looks like you have sinusitis.  Sinusitis is inflammation and infection in the sinus cavities of the head.  Based on your presentation I believe you most likely have Acute Bacterial Sinusitis.  This is an infection caused by bacteria and is treated with antibiotics. I have prescribed Augmentin 875mg/125mg one tablet twice daily with food, for 7 days. You may use an oral decongestant such as Mucinex D or if you have glaucoma or high blood pressure use plain Mucinex. Saline nasal spray help and can safely be used as often as needed for congestion.  If you develop worsening sinus pain, fever or notice severe headache and vision changes, or if symptoms are not better after completion of antibiotic, please schedule an appointment with a health care provider.    Sinus infections are not as easily transmitted as other respiratory infection, however we still recommend that you avoid close contact with loved ones, especially the very young and elderly.  Remember to wash your hands thoroughly throughout the day as this is the number one way to prevent the spread of infection!  Home Care: Only take medications as instructed by your medical team. Complete the entire course of an antibiotic. Do not take these medications with alcohol. A steam or ultrasonic humidifier can help congestion.  You can place a towel over your head and breathe in the steam from hot water coming from a faucet. Avoid close contacts especially the very young and the elderly. Cover your mouth when you cough or sneeze. Always remember to wash your hands.  Get Help Right Away If: You develop worsening fever or sinus pain. You develop a severe head ache or visual changes. Your symptoms persist after you have completed your treatment plan.  Make sure you Understand these instructions. Will watch  your condition. Will get help right away if you are not doing well or get worse.  Thank you for choosing an e-visit.  Your e-visit answers were reviewed by a board certified advanced clinical practitioner to complete your personal care plan. Depending upon the condition, your plan could have included both over the counter or prescription medications.  Please review your pharmacy choice. Make sure the pharmacy is open so you can pick up prescription now. If there is a problem, you may contact your provider through MyChart messaging and have the prescription routed to another pharmacy.  Your safety is important to us. If you have drug allergies check your prescription carefully.   For the next 24 hours you can use MyChart to ask questions about today's visit, request a non-urgent call back, or ask for a work or school excuse. You will get an email in the next two days asking about your experience. I hope that your e-visit has been valuable and will speed your recovery.    have provided 5 minutes of non face to face time during this encounter for chart review and documentation.   

## 2023-05-08 ENCOUNTER — Ambulatory Visit: Payer: Commercial Managed Care - PPO

## 2023-05-11 ENCOUNTER — Telehealth: Payer: Commercial Managed Care - PPO | Admitting: Family

## 2023-05-11 DIAGNOSIS — H1013 Acute atopic conjunctivitis, bilateral: Secondary | ICD-10-CM

## 2023-05-11 MED ORDER — CETIRIZINE HCL 10 MG PO TABS
10.0000 mg | ORAL_TABLET | Freq: Every day | ORAL | 1 refills | Status: DC
Start: 2023-05-11 — End: 2023-05-11

## 2023-05-11 NOTE — Progress Notes (Signed)
E-Visit for [employer]   We are sorry that you are not feeling well.  Here is how we plan to help!  Based on what you have shared with me it looks like you have conjunctivitis.  Conjunctivitis is a common inflammatory or infectious condition of the eye that is often referred to as "pink eye".  In most cases it is contagious (viral or bacterial). However, not all conjunctivitis requires antibiotics (ex. Allergic).  We have made appropriate suggestions for you based upon your presentation.  I recommend that you use OpconA, 1-2 drops every 4-6 hours (an over the counter allergy drop available at your local pharmacy).  Your pharmacist may have an alternative suggestion. I have also sent zyrtec 10 mg Prescription sent to pharmacy. Stop the Claritin.   Pink eye can be highly contagious.  It is typically spread through direct contact with secretions, or contaminated objects or surfaces that one may have touched.  Strict handwashing is suggested with soap and water is urged.  If not available, use alcohol based had sanitizer.  Avoid unnecessary touching of the eye.  If you wear contact lenses, you will need to refrain from wearing them until you see no white discharge from the eye for at least 24 hours after being on medication.  You should see symptom improvement in 1-2 days after starting the medication regimen.  Call us if symptoms are not improved in 1-2 days.  Home Care: Wash your hands often! Do not wear your contacts until you complete your treatment plan. Avoid sharing towels, bed linen, personal items with a person who has pink eye. See attention for anyone in your home with similar symptoms.  Get Help Right Away If: Your symptoms do not improve. You develop blurred or loss of vision. Your symptoms worsen (increased discharge, pain or redness)   Thank you for choosing an e-visit.  Your e-visit answers were reviewed by a board certified advanced clinical practitioner to complete your personal  care plan. Depending upon the condition, your plan could have included both over the counter or prescription medications.  Please review your pharmacy choice. Make sure the pharmacy is open so you can pick up prescription now. If there is a problem, you may contact your provider through [employer] and have the prescription routed to another pharmacy.  Your safety is important to [place]. If you have drug allergies check your prescription carefully.   For the next 24 hours you can use MyChart to ask questions about today's visit, request a non-urgent call back, or ask for a work or school excuse. You will get an email in the next two days asking about your experience. I hope that your e-visit has been valuable and will speed your recovery.  Approximately 5 minutes was spent documenting and reviewing patient's chart.

## 2023-05-13 ENCOUNTER — Other Ambulatory Visit: Payer: Self-pay | Admitting: Internal Medicine

## 2023-05-13 DIAGNOSIS — I10 Essential (primary) hypertension: Secondary | ICD-10-CM

## 2023-05-13 DIAGNOSIS — R000 Tachycardia, unspecified: Secondary | ICD-10-CM

## 2023-05-14 ENCOUNTER — Other Ambulatory Visit (HOSPITAL_COMMUNITY): Payer: Self-pay

## 2023-05-15 ENCOUNTER — Other Ambulatory Visit: Payer: Self-pay

## 2023-05-15 ENCOUNTER — Other Ambulatory Visit (HOSPITAL_COMMUNITY): Payer: Self-pay

## 2023-05-15 MED ORDER — NEBIVOLOL HCL 5 MG PO TABS
5.0000 mg | ORAL_TABLET | Freq: Every day | ORAL | 0 refills | Status: DC
  Filled 2023-05-15: qty 90, 90d supply, fill #0

## 2023-05-17 ENCOUNTER — Other Ambulatory Visit: Payer: Self-pay | Admitting: Internal Medicine

## 2023-05-17 DIAGNOSIS — I10 Essential (primary) hypertension: Secondary | ICD-10-CM

## 2023-05-20 ENCOUNTER — Other Ambulatory Visit (HOSPITAL_COMMUNITY): Payer: Self-pay

## 2023-05-20 ENCOUNTER — Other Ambulatory Visit: Payer: Self-pay

## 2023-05-20 MED ORDER — OLMESARTAN MEDOXOMIL 20 MG PO TABS
20.0000 mg | ORAL_TABLET | Freq: Every day | ORAL | 0 refills | Status: DC
  Filled 2023-05-20: qty 90, 90d supply, fill #0

## 2023-05-21 ENCOUNTER — Other Ambulatory Visit: Payer: Self-pay

## 2023-05-29 ENCOUNTER — Ambulatory Visit
Admission: RE | Admit: 2023-05-29 | Discharge: 2023-05-29 | Disposition: A | Discharge status: Home / Self Care | Payer: Commercial Managed Care - PPO | Source: Ambulatory Visit | Attending: Internal Medicine | Admitting: Internal Medicine

## 2023-05-29 DIAGNOSIS — Z1231 Encounter for screening mammogram for malignant neoplasm of breast: Secondary | ICD-10-CM

## 2023-06-12 ENCOUNTER — Telehealth: Payer: Commercial Managed Care - PPO | Admitting: Family Medicine

## 2023-06-12 DIAGNOSIS — J069 Acute upper respiratory infection, unspecified: Secondary | ICD-10-CM

## 2023-06-12 NOTE — Progress Notes (Signed)
Because recently treated for a sinus infection at the end of Dec, we your condition warrants further evaluation and recommend that you be seen in a face-to-face visit at your PCP office or local urgent care.    NOTE: There will be NO CHARGE for this E-Visit   If you are having a true medical emergency, please call 911.

## 2023-06-13 ENCOUNTER — Ambulatory Visit (HOSPITAL_BASED_OUTPATIENT_CLINIC_OR_DEPARTMENT_OTHER)
Admission: EM | Admit: 2023-06-13 | Discharge: 2023-06-13 | Disposition: A | Discharge status: Home / Self Care | Payer: Commercial Managed Care - PPO | Attending: Family Medicine | Admitting: Family Medicine

## 2023-06-13 ENCOUNTER — Other Ambulatory Visit (HOSPITAL_BASED_OUTPATIENT_CLINIC_OR_DEPARTMENT_OTHER): Payer: Self-pay

## 2023-06-13 ENCOUNTER — Other Ambulatory Visit: Payer: Self-pay | Admitting: Primary Care

## 2023-06-13 ENCOUNTER — Encounter (HOSPITAL_BASED_OUTPATIENT_CLINIC_OR_DEPARTMENT_OTHER): Payer: Self-pay

## 2023-06-13 DIAGNOSIS — J019 Acute sinusitis, unspecified: Secondary | ICD-10-CM | POA: Diagnosis not present

## 2023-06-13 DIAGNOSIS — J4521 Mild intermittent asthma with (acute) exacerbation: Secondary | ICD-10-CM | POA: Diagnosis not present

## 2023-06-13 MED ORDER — ALBUTEROL SULFATE HFA 108 (90 BASE) MCG/ACT IN AERS
2.0000 | INHALATION_SPRAY | RESPIRATORY_TRACT | 0 refills | Status: AC | PRN
Start: 2023-06-13 — End: ?
  Filled 2023-06-13: qty 6.7, 17d supply, fill #0

## 2023-06-13 MED ORDER — PREDNISONE 20 MG PO TABS
40.0000 mg | ORAL_TABLET | Freq: Every day | ORAL | 0 refills | Status: AC
Start: 2023-06-13 — End: 2023-06-16
  Filled 2023-06-13: qty 6, 3d supply, fill #0

## 2023-06-13 MED ORDER — AMOXICILLIN-POT CLAVULANATE 875-125 MG PO TABS
1.0000 | ORAL_TABLET | Freq: Two times a day (BID) | ORAL | 0 refills | Status: AC
  Filled 2023-06-13: qty 14, 7d supply, fill #0

## 2023-06-13 MED ORDER — ONDANSETRON 4 MG PO TBDP
4.0000 mg | ORAL_TABLET | Freq: Three times a day (TID) | ORAL | 0 refills | Status: DC | PRN
Start: 2023-06-13 — End: 2023-09-08
  Filled 2023-06-13: qty 10, 4d supply, fill #0

## 2023-06-13 NOTE — Discharge Instructions (Signed)
Albuterol inhaler--do 2 puffs every 4 hours as needed for shortness of breath or wheezing  Take prednisone 20 mg--2 daily for 3 days  Take amoxicillin-clavulanate 875 mg--1 tab twice daily with food for 7 days  Ondansetron dissolved in the mouth every 8 hours as needed for nausea or vomiting. Clear liquids(water, gatorade/pedialyte, ginger ale/sprite, chicken broth/soup) and bland things(crackers/toast, rice, potato, bananas) to eat. Avoid acidic foods like lemon/lime/orange/tomato, and avoid greasy/spicy foods.

## 2023-06-13 NOTE — ED Provider Notes (Signed)
Evert Kohl Duran    CSN: [id number] Arrival date & time: 06/13/23  0806      History   Chief Complaint Chief Complaint  Patient presents with   Facial Pain   Sore Throat   Cough    HPI Megan Duran is a 43 y.o. female.    Sore Throat  Cough Here for cough and congestion and sinus pressure.  She is also had some rhinorrhea.  Symptoms began about 8 or 9 days ago, and then they got worse yesterday with some more sore throat and a lot more sinus pressure and pressure in her ears.  She maybe had a temperature of 99.52 days ago.  She has had nausea from the sinus drainage but no vomiting or diarrhea.  She is allergic to sulfa, macrolides, tetracyclines, and Keflex.  All those medications cause hives.  She tolerates penicillins and Augmentin without trouble.  Past medical history significant for hypertension and diabetes.  She states her sugars have been doing well lately.  Has a history of asthma.  Her chest is felt a little tight in the last couple of days.  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   Arthritis of both hands 02/20/2023   Diabetes mellitus (HCC) 08/23/2022   Hypertension associated with diabetes (HCC) 08/23/2022   Severe sleep apnea 02/28/2022   Insomnia 02/28/2022   Need for vaccination 11/26/2021   PHN (postherpetic neuralgia) 11/13/2021   Nasal turbinate hypertrophy 04/17/2021   Primary hypertension 12/05/2020   Morbid obesity with BMI of 40.0-44.9, adult (HCC) 12/05/2020   Encounter for general adult medical examination with abnormal findings 12/05/2020   LVH (left ventricular hypertrophy) due to hypertensive disease,  without heart failure 12/05/2020   Visit for screening mammogram 12/05/2020   Iron deficiency 12/22/2015   Vitamin D deficiency 12/22/2015   Gastroparesis 12/20/2015   IBS (irritable bowel syndrome) 12/20/2015   Osteoarthritis involving multiple joints on both sides of body 12/20/2015   GERD (gastroesophageal reflux disease) 12/20/2015    Past Surgical History:  Procedure Laterality Date   GALLBLADDER SURGERY  2007   NASAL SEPTOPLASTY W/ TURBINOPLASTY Bilateral 07/11/2021   Procedure: NASAL SEPTOPLASTY WITH TURBINATE REDUCTION;  Surgeon: Osborn Coho, MD;  Location: [facility];  Service: ENT;  Laterality: Bilateral;    OB History     Gravida  2   Para  2   Term  2   Preterm      AB      Living  2      SAB      IAB      Ectopic      Multiple  0   Live Births  1            Home Medications    Prior to Admission medications   Medication Sig Start Date End Date Taking? Authorizing Provider  albuterol (VENTOLIN HFA) 108 (90 Base) MCG/ACT inhaler Inhale 2 puffs into the lungs every 4 (four) hours as needed for wheezing or shortness of breath. 06/13/23  Yes Zenia Resides, MD  amoxicillin-clavulanate (AUGMENTIN) 875-125 MG  tablet Take 1 tablet by mouth 2 (two) times daily for 7 days. 06/13/23 06/20/23 Yes Zenia Resides, MD  ondansetron (ZOFRAN-ODT) 4 MG disintegrating tablet Take 1 tablet (4 mg total) by mouth every 8 (eight) hours as needed for nausea or vomiting. 06/13/23  Yes Lexii Walsh, Janace Aris, MD  predniSONE (DELTASONE) 20 MG tablet Take 2 tablets (40 mg total) by mouth daily with breakfast for 3 days. 06/13/23 06/16/23 Yes Zenia Resides, MD  Cholecalciferol (VITAMIN D3) POWD Take by mouth. 04/17/21   [provider]  fluticasone (FLONASE) 50 MCG/ACT nasal spray Place 2 sprays into both nostrils daily. 07/24/22   Junie Spencer, FNP  gabapentin (NEURONTIN) 300 MG capsule Take 1 capsule (300 mg total) by mouth 2 (two) times daily  as needed (pain). 04/09/23   Viviano Simas, FNP  levonorgestrel (MIRENA, 52 MG,) 20 MCG/24HR IUD Mirena 20 mcg/24 hours (6 yrs) 52 mg intrauterine device  Take 1 device by intrauterine route.    [provider]  loratadine (CLARITIN) 10 MG tablet Take 10 mg by mouth daily. 12/20/15   [provider]  Multiple Vitamin (MULTIVITAMIN PO) Take by mouth.    [provider]  nebivolol (BYSTOLIC) 5 MG tablet Take 1 tablet (5 mg total) by mouth daily. 05/15/23   Etta Grandchild, MD  olmesartan (BENICAR) 20 MG tablet Take 1 tablet (20 mg total) by mouth daily. 05/20/23   Etta Grandchild, MD  omeprazole (PRILOSEC) 40 MG capsule Take 1 capsule (40 mg total) by mouth 2 (two) times daily. 06/20/22   Kozlow, Alvira Philips, MD  tirzepatide [facility]) 15 MG/0.5ML Pen Inject 15 mg into the skin once a week. 04/28/23   Etta Grandchild, MD  traZODone (DESYREL) 100 MG tablet Take 1 tablet (100 mg total) by mouth at bedtime as needed for sleep. 04/15/23   Glenford Bayley, NP  VITAMIN D PO Take by mouth daily.    [provider]    Family History Family History  Problem Relation Age of Onset   Anxiety disorder Mother    Depression Mother    Heart Problems Mother    Hypertension Mother    Diabetes Mother    AVM Mother    Stroke Mother    Atrial fibrillation Mother    Heart attack Mother    Fibromyalgia Mother    Breast cancer Mother 59       recur @ 24   Congestive Heart Failure Mother    Hypertension Father    Diabetes Father    Obesity Father    Heart attack Father    High Cholesterol Father    Depression Sister    Pancreatitis Sister    Diabetes Sister    Hypertension Sister    GER disease Sister    High Cholesterol Sister    Migraines Sister    Pancreatic cancer Maternal Aunt    Pancreatic cancer Maternal Uncle    Dementia Maternal Grandmother    Hypertension Maternal Grandmother    Hyperthyroidism Maternal Grandmother    Throat cancer Maternal Grandfather    Heart  attack Paternal Grandmother     Social History Social History   Tobacco Use   Smoking status: Never    Passive exposure: Past   Smokeless tobacco: Never  Vaping Use   Vaping status: Never Used  Substance Use Topics   Alcohol use: No    Alcohol/week: 0.0 standard drinks of alcohol   Drug use: Never  Allergies   Bactrim [sulfamethoxazole-trimethoprim], Biaxin [clarithromycin], Doxycycline, Effexor [venlafaxine], Erythromycin, Imitrex [sumatriptan], Keflex [cephalexin], and Minocycline   Review of Systems Review of Systems  Respiratory:  Positive for cough.      Physical Exam Triage Vital Signs ED Triage Vitals  Encounter Vitals Group     BP 06/13/23 0844 (!) 136/97     Systolic BP Percentile --      Diastolic BP Percentile --      Pulse Rate 06/13/23 0844 90     Resp 06/13/23 0844 20     Temp 06/13/23 0844 97.8 F (36.6 C)     Temp Source 06/13/23 0844 Oral     SpO2 06/13/23 0844 99 %     Weight --      Height --      Head Circumference --      Peak Flow --      Pain Score 06/13/23 0846 6     Pain Loc --      Pain Education --      Exclude from Growth Chart --    No data found.  Updated Vital Signs BP (!) 136/97 (BP Location: Right Arm)   Pulse 90   Temp 97.8 F (36.6 C) (Oral)   Resp 20   SpO2 99%   Visual Acuity Right Eye Distance:   Left Eye Distance:   Bilateral Distance:    Right Eye Near:   Left Eye Near:    Bilateral Near:     Physical Exam Vitals reviewed.  Constitutional:      General: She is not in acute distress.    Appearance: She is not ill-appearing, toxic-appearing or diaphoretic.  HENT:     Right Ear: Tympanic membrane and ear canal normal.     Left Ear: Tympanic membrane and ear canal normal.     Nose: Congestion present.     Mouth/Throat:     Mouth: Mucous membranes are moist.     Comments: There is white and clear Eyes:     Extraocular Movements: Extraocular movements intact.     Conjunctiva/sclera: Conjunctivae  normal.     Pupils: Pupils are equal, round, and reactive to light.  Cardiovascular:     Rate and Rhythm: Normal rate and regular rhythm.     Heart sounds: No murmur heard. Pulmonary:     Effort: No respiratory distress.     Breath sounds: No stridor. No wheezing, rhonchi or rales.  Musculoskeletal:     Cervical back: Neck supple.  Lymphadenopathy:     Cervical: No cervical adenopathy.  Skin:    Capillary Refill: Capillary refill takes less than 2 seconds.     Coloration: Skin is not jaundiced or pale.  Neurological:     General: No focal deficit present.     Mental Status: She is alert and oriented to person, place, and time.  Psychiatric:        Behavior: Behavior normal.      UC Treatments / Results  Labs (all labs ordered are listed, but only abnormal results are displayed) Labs Reviewed - No data to display  EKG   Radiology No results found.  Procedures Procedures (including critical Duran time)  Medications Ordered in UC Medications - No data to display  Initial Impression / Assessment and Plan / UC Course  I have reviewed the triage vital signs and the nursing notes.  Pertinent labs & imaging results that were available during my Duran of the patient were reviewed by me  and considered in my medical decision making (see chart for details).     Augmentin is sent in to treat the acute sinusitis.  She has a lengthy timeframe of symptoms and has had double sickening.  Prednisone for 3 days is sent and to treat possible asthma exacerbation and help aid in the sinusitis improving.  She is aware that this not raise her sugars.  Albuterol is also sent in and Zofran is sent in for her nausea that she is having with the postnasal drainage. Final Clinical Impressions(s) / UC Diagnoses   Final diagnoses:  Acute sinusitis, recurrence not specified, unspecified location  Mild intermittent asthma with (acute) exacerbation     Discharge Instructions      Albuterol  inhaler--do 2 puffs every 4 hours as needed for shortness of breath or wheezing  Take prednisone 20 mg--2 daily for 3 days  Take amoxicillin-clavulanate 875 mg--1 tab twice daily with food for 7 days  Ondansetron dissolved in the mouth every 8 hours as needed for nausea or vomiting. Clear liquids(water, gatorade/pedialyte, ginger ale/sprite, chicken broth/soup) and bland things(crackers/toast, rice, potato, bananas) to eat. Avoid acidic foods like lemon/lime/orange/tomato, and avoid greasy/spicy foods.       ED Prescriptions     Medication Sig Dispense Auth. Provider   amoxicillin-clavulanate (AUGMENTIN) 875-125 MG tablet Take 1 tablet by mouth 2 (two) times daily for 7 days. 14 tablet Nameer Summer, Janace Aris, MD   predniSONE (DELTASONE) 20 MG tablet Take 2 tablets (40 mg total) by mouth daily with breakfast for 3 days. 6 tablet Kalise Fickett, Janace Aris, MD   albuterol (VENTOLIN HFA) 108 (90 Base) MCG/ACT inhaler Inhale 2 puffs into the lungs every 4 (four) hours as needed for wheezing or shortness of breath. 1 each Zenia Resides, MD   ondansetron (ZOFRAN-ODT) 4 MG disintegrating tablet Take 1 tablet (4 mg total) by mouth every 8 (eight) hours as needed for nausea or vomiting. 10 tablet Marlinda Mike Janace Aris, MD      PDMP not reviewed this encounter.   Zenia Resides, MD 06/13/23 [phone]

## 2023-06-13 NOTE — ED Triage Notes (Signed)
Reporting sinus pain/pressure, cough, sore throat, headache. Symptoms "for over a week". Hx of sinus infections.

## 2023-06-17 ENCOUNTER — Other Ambulatory Visit: Payer: Self-pay | Admitting: Primary Care

## 2023-06-18 ENCOUNTER — Other Ambulatory Visit: Payer: Self-pay

## 2023-06-23 ENCOUNTER — Ambulatory Visit: Payer: Commercial Managed Care - PPO | Admitting: Primary Care

## 2023-06-23 ENCOUNTER — Other Ambulatory Visit (HOSPITAL_COMMUNITY): Payer: Self-pay

## 2023-06-23 ENCOUNTER — Other Ambulatory Visit: Payer: Self-pay

## 2023-06-23 ENCOUNTER — Encounter: Payer: Self-pay | Admitting: Primary Care

## 2023-06-23 VITALS — BP 126/84 | HR 110 | Temp 96.9°F | Ht 65.0 in | Wt 314.2 lb

## 2023-06-23 DIAGNOSIS — J45909 Unspecified asthma, uncomplicated: Secondary | ICD-10-CM | POA: Diagnosis not present

## 2023-06-23 DIAGNOSIS — G4733 Obstructive sleep apnea (adult) (pediatric): Secondary | ICD-10-CM

## 2023-06-23 DIAGNOSIS — G4700 Insomnia, unspecified: Secondary | ICD-10-CM

## 2023-06-23 MED ORDER — TRAZODONE HCL 150 MG PO TABS
150.0000 mg | ORAL_TABLET | Freq: Every day | ORAL | 3 refills | Status: DC
Start: 2023-06-23 — End: 2023-10-12
  Filled 2023-06-23: qty 30, 30d supply, fill #0
  Filled 2023-07-19: qty 30, 30d supply, fill #1
  Filled 2023-08-17: qty 30, 30d supply, fill #2
  Filled 2023-09-17: qty 30, 30d supply, fill #3

## 2023-06-23 MED ORDER — BUDESONIDE-FORMOTEROL FUMARATE 160-4.5 MCG/ACT IN AERO
2.0000 | INHALATION_SPRAY | Freq: Two times a day (BID) | RESPIRATORY_TRACT | 3 refills | Status: DC
  Filled 2023-06-23: qty 10.2, 30d supply, fill #0
  Filled 2023-07-19: qty 10.2, 30d supply, fill #1
  Filled 2023-08-18: qty 10.2, 30d supply, fill #2
  Filled 2023-09-17: qty 10.2, 30d supply, fill #3

## 2023-06-23 NOTE — Patient Instructions (Signed)
-  OBSTRUCTIVE SLEEP APNEA: Obstructive sleep apnea is a condition where your breathing stops and starts repeatedly during sleep. You are managing it well with your CPAP therapy, and your apnea score is excellent. Continue using your CPAP every night.  -INSOMNIA: Insomnia is difficulty falling or staying asleep. Your current medication, Trazodone , has helped but not completely resolved your symptoms. We are increasing your dose to 150mg  nightly, taken 30-60 minutes before bedtime. Please report any grogginess you experience.  -ASTHMA: Asthma is a condition where your airways narrow and swell, causing difficulty breathing. You have been experiencing symptoms like cough and wheezing. We are prescribing Symbicort  160 for daily use with 3 refills. Remember to rinse your mouth after use to prevent oral thrush. If your symptoms are well controlled, we may consider reducing the dose.  INSTRUCTIONS:  Please return in 1 year for sleep apnea management. If your asthma management continues with this provider, return in 6 months.

## 2023-06-23 NOTE — Progress Notes (Signed)
 @Patient  ID: Megan Duran, female    DOB: June 23, 1980, 43 y.o.   MRN: [id number]  Chief Complaint  Patient presents with   Follow-up    Obstructive Sleep apnea    Referring provider: Arcadio Knuckles, MD  HPI: 43 year old female, never smoked.  Past medical history significant for left ventricular hypertrophy without heart failure, hypertension, vitamin D  deficiency, gastroparesis, IBS, iron deficiency, morbid obesity.  Previous [facility] pulmonary encounters:  09/26/2021 Patient presents today for sleep consult. Referred by ENT, Dr. Melissa Spring d/t possible OSA. She had septoplasty and turbinate reduction  in March 2023. Her sleep has improved some since surgery but she continues to have snoring symptoms. No significant daytime sleepiness. She has difficulty initiating sleep and trouble staying asleep.  Typical bedtime is between 10 and 11 PM.  It can take her an hour to fall asleep.  She wakes up on average 1 or 2 times a night.  She starts her day at 6:30 AM.  She has two children ages 47 and 75.  She had previously lost approximately 100 pounds with a weight loss program but has since gained that weight back.  She has never had a sleep study.  She does not wear CPAP or oxygen.  Epworth score is 3.  Sleep questionnaire Symptoms- loud snoring, trouble staying asleep  Prior sleep study- No Bedtime- 10-11pm Time to fall asleep- about an hour Nocturnal awakenings- 1-2 times Out of bed/start of day- 6:30am Weight changes- up 100lbs  Do you operate heavy machinery- No Do you currently wear CPAP- No Do you current wear oxygen- No Epworth- 3  12/25/2021 Patient contacted today for virtual video visit to review sleep study results. She has symptoms of snoring and insomnia. She had a home sleep study on 12/10/2021 that showed evidence of severe obstructive sleep apnea, AHI 41.7/hour. Reviewed sleep study results patient patient and treatment options, recommending she be started on CPAP d/t severity  of slepe apnea and patient in agreement with plan. She has trouble falling and staying asleep.   Severe OSA - HST 12/10/21 >> AHI 41.7/hour - Starting auto CPAP 5-20cm h20 with mask of choice, heated humidity - Advised patient aim to wear CPAP every night for 4-6 hours or longer - Encourage weight loss efforts, side sleeping position  - FU 31-90 days after starting CPAP for compliance check      02/28/2022- Interim hx  Patient presents today for 2-3 month follow-up.  Patient has severe obstructive sleep apnea.  She was started on auto CPAP 5 to 20 cm H2O back in August.  We also started her on trazodone  50 to 100 mg at bedtime as needed for insomnia.  She is doing great, adjusted well to wearing CPAP. She reports CPAP has helped significantly with headaches. She has more energy. 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 She take trazodone  100mg  at bedtime which has also helped with insomnia. She is getting 8 hours of sleep a night. No residual daytime sleepiness.  Airview download 01/28/2022 - 02/26/2022 30/30 days; 97% greater than 4 hours Average usage 8 hours 13 minutes Pressure 5 to 20 cm H2O (13.4 cm H2O-95%) Air leaks 0.6 L/min (95%) AHI 0.7   06/23/2023- Interim hx  Discussed the use of AI scribe software for clinical note transcription with the patient, who gave verbal consent to proceed.  History of Present Illness   Megan Duran is a 43 year old female with  severe sleep apnea who presents for a regular follow-up.  She has severe sleep apnea, diagnosed in July 2023, with an average of 41 apneic events per hour. She was started on auto CPAP therapy and had a follow-up in October 2023, where she adjusted well to the CPAP. She uses the CPAP every night for an average of eight hours, with a current apnea score of 0.2. There is improvement in her symptoms, but she notes occasional nights without CPAP use due to sinus issues,  resulting in snoring as reported by her husband.  She has a history of asthma, diagnosed based on a breathing test several years ago. She has used maintenance inhalers in the past, including Breztri  and Symbicort . This year, she reports increased asthma symptoms, including cough and wheezing, occurring about three times a week, often aggravated by sinus issues. She has not used a maintenance inhaler for several months.  She has previously been on Breztri  and Symbicort .  She also has a history of insomnia for which she was prescribed trazodone . She takes 100 mg of trazodone  every night, about an hour before bed, which has improved her insomnia but not completely resolved it.  It takes her about an hour to fall asleep with medication.  She denies any residual grogginess from the medication. It takes her about an hour to fall asleep.      Airview download 05/20/22-06/19/23 Usage days 30/30 days (100%) Average usage 8 hours 11 minutes Pressure 5 to 20 cm H2O (10.3 cm H2O-95%) Air leaks 0.4 L/min (95%) AHI 0.2       Allergies  Allergen Reactions   Bactrim [Sulfamethoxazole-Trimethoprim] Hives   Biaxin [Clarithromycin] Hives   Doxycycline Hives   Effexor [Venlafaxine] Hives   Erythromycin Hives   Imitrex [Sumatriptan] Hives   Keflex [Cephalexin] Hives   Minocycline Hives    Immunization History  Administered Date(s) Administered   Hepatitis B 10/12/2006   Influenza Split 02/24/2022   Influenza-Unspecified 02/10/2013, 03/03/2021, 02/24/2022   MMR 05/13/1981   PFIZER(Purple Top)SARS-COV-2 Vaccination 06/01/2019, 06/24/2019, 03/24/2020   PNEUMOCOCCAL CONJUGATE-20 11/26/2021, 08/22/2022   Tdap 05/13/2005, 01/21/2017    Past Medical History:  Diagnosis Date   Asthma    only uses maintanence inhaler   Gastroparesis    GERD (gastroesophageal reflux disease)    takes meds   History of MRSA infection    IBS (irritable bowel syndrome)    Migraine    PONV (postoperative nausea and  vomiting)     Tobacco History: Social History   Tobacco Use  Smoking Status Never   Passive exposure: Past  Smokeless Tobacco Never   Counseling given: Not Answered   Outpatient Medications Prior to Visit  Medication Sig Dispense Refill   albuterol  (VENTOLIN  HFA) 108 (90 Base) MCG/ACT inhaler Inhale 2 puffs into the lungs every 4 (four) hours as needed for wheezing or shortness of breath. 6.7 g 0   Cholecalciferol (VITAMIN D3) POWD Take by mouth.     fluticasone  (FLONASE ) 50 MCG/ACT nasal spray Place 2 sprays into both nostrils daily. 16 g 6   gabapentin  (NEURONTIN ) 300 MG capsule Take 1 capsule (300 mg total) by mouth 2 (two) times daily as needed (pain). 30 capsule 0   levonorgestrel (MIRENA, 52 MG,) 20 MCG/24HR IUD Mirena 20 mcg/24 hours (6 yrs) 52 mg intrauterine device  Take 1 device by intrauterine route.     loratadine  (CLARITIN ) 10 MG tablet Take 10 mg by mouth daily.     Multiple Vitamin (MULTIVITAMIN PO) Take by  mouth.     nebivolol  (BYSTOLIC ) 5 MG tablet Take 1 tablet (5 mg total) by mouth daily. 90 tablet 0   olmesartan  (BENICAR ) 20 MG tablet Take 1 tablet (20 mg total) by mouth daily. 90 tablet 0   omeprazole  (PRILOSEC) 40 MG capsule Take 1 capsule (40 mg total) by mouth 2 (two) times daily. 60 capsule 2   ondansetron  (ZOFRAN -ODT) 4 MG disintegrating tablet Take 1 tablet (4 mg total) by mouth every 8 (eight) hours as needed for nausea or vomiting. 10 tablet 0   tirzepatide  (MOUNJARO ) 15 MG/0.5ML Pen Inject 15 mg into the skin once a week. 6 mL 0   traZODone  (DESYREL ) 100 MG tablet Take 1 tablet (100 mg total) by mouth at bedtime as needed for sleep. 30 tablet 1   VITAMIN D  PO Take by mouth daily.     No facility-administered medications prior to visit.   Review of Systems  Review of Systems  Constitutional: Negative.   HENT: Negative.    Respiratory:  Positive for cough and wheezing.        Intermittent asthma symptoms   Cardiovascular: Negative.       Physical Exam  There were no vitals taken for this visit. Physical Exam Constitutional:      General: She is not in acute distress.    Appearance: Normal appearance. She is obese. She is not ill-appearing.  Cardiovascular:     Rate and Rhythm: Normal rate and regular rhythm.  Pulmonary:     Effort: Pulmonary effort is normal.     Breath sounds: Normal breath sounds. No wheezing, rhonchi or rales.     Comments: CTA Musculoskeletal:        General: Normal range of motion.  Skin:    General: Skin is warm and dry.  Neurological:     Mental Status: She is alert.  Psychiatric:        Mood and Affect: Mood normal.        Behavior: Behavior normal.        Thought Content: Thought content normal.      Lab Results:  CBC    Component Value Date/Time   WBC 9.9 02/20/2023 1531   RBC 4.39 02/20/2023 1531   HGB 13.1 02/20/2023 1531   HCT 40.6 02/20/2023 1531   PLT 311.0 02/20/2023 1531   MCV 92.4 02/20/2023 1531   MCH 29.6 04/02/2017 0515   MCHC 32.2 02/20/2023 1531   RDW 15.4 02/20/2023 1531   LYMPHSABS 1.4 02/20/2023 1531   MONOABS 0.7 02/20/2023 1531   EOSABS 0.1 02/20/2023 1531   BASOSABS 0.0 02/20/2023 1531    BMET    Component Value Date/Time   NA 134 (L) 02/20/2023 1531   NA 138 11/19/2021 0000   K 4.1 02/20/2023 1531   CL 102 02/20/2023 1531   CO2 26 02/20/2023 1531   GLUCOSE 115 (H) 02/20/2023 1531   BUN 16 02/20/2023 1531   BUN 22 (A) 11/19/2021 0000   CREATININE 0.87 02/20/2023 1531   CALCIUM 9.7 02/20/2023 1531   GFRNONAA >60 05/04/2009 1849   GFRAA  05/04/2009 1849    >60        The eGFR has been calculated using the MDRD equation. This calculation has not been validated in all clinical situations. eGFR's persistently <60 mL/min signify possible Chronic Kidney Disease.    BNP No results found for: "BNP"  ProBNP No results found for: "PROBNP"  Imaging: MM 3D SCREENING MAMMOGRAM BILATERAL BREAST Result Date: 05/30/2023 CLINICAL  DATA:   Screening. EXAM: DIGITAL SCREENING BILATERAL MAMMOGRAM WITH TOMOSYNTHESIS AND CAD TECHNIQUE: Bilateral screening digital craniocaudal and mediolateral oblique mammograms were obtained. Bilateral screening digital breast tomosynthesis was performed. The images were evaluated with computer-aided detection. COMPARISON:  Previous exam(s). ACR Breast Density Category b: There are scattered areas of fibroglandular density. FINDINGS: There are no findings suspicious for malignancy. IMPRESSION: No mammographic evidence of malignancy. A result letter of this screening mammogram will be mailed directly to the patient. RECOMMENDATION: Screening mammogram in one year. (Code:SM-B-01Y) The American Cancer Society recommends annual MRI and mammography in patients with an estimated lifetime risk of developing breast cancer greater than 20 - 25%, or who are known or suspected to be positive for the breast cancer gene. BI-RADS CATEGORY  1: Negative. Electronically Signed   By: Dina  Arceo M.D.   On: 05/30/2023 15:55     Assessment & Plan:   1. Insomnia, unspecified type (Primary)  2. OSA on CPAP  3. Asthma, unspecified asthma severity, unspecified whether complicated, unspecified whether persistent     Obstructive Sleep Apnea Severe sleep apnea managed with auto CPAP (5-15cm h20). Excellent compliance and efficacy with an apnea score of 0.2/hour. No issues reported with CPAP use. -Continue auto CPAP nightly 4-6 hours.  Insomnia Partial response to Trazodone  100mg  nightly. No residual grogginess reported. Sleep latency of approximately 1 hour. -Increase Trazodone  to 150mg  nightly, taken 30-60 minutes before bedtime. -Report any residual grogginess for dose adjustment.  Asthma Reports of cough and wheezing approximately 3 times per week. Previously managed with Breztri  and Symbicort  160 -Prescribe Symbicort  160 two puffs twice daily -Rinse mouth after use to prevent oral thrush -Consider dose reduction to  Symbicort  80 if symptoms well controlled at follow-up  Follow-up -Return in 1 year for sleep apnea management. -Return in 6 months if asthma management continues with this provider.       Antonio Baumgarten, NP 06/23/2023

## 2023-06-24 ENCOUNTER — Other Ambulatory Visit (HOSPITAL_COMMUNITY): Payer: Self-pay

## 2023-07-18 ENCOUNTER — Other Ambulatory Visit: Payer: Self-pay | Admitting: Internal Medicine

## 2023-07-18 ENCOUNTER — Telehealth: Admitting: Family Medicine

## 2023-07-18 DIAGNOSIS — J019 Acute sinusitis, unspecified: Secondary | ICD-10-CM

## 2023-07-18 DIAGNOSIS — B9689 Other specified bacterial agents as the cause of diseases classified elsewhere: Secondary | ICD-10-CM

## 2023-07-18 DIAGNOSIS — E1169 Type 2 diabetes mellitus with other specified complication: Secondary | ICD-10-CM

## 2023-07-18 IMAGING — MG MM DIGITAL SCREENING BILAT W/ TOMO AND CAD
6 of 12 series · 6 of 36 positions shown · non-contrast
Comparison: Previous exam(s).

CLINICAL DATA: Screening.

EXAM:
DIGITAL SCREENING BILATERAL MAMMOGRAM WITH TOMOSYNTHESIS AND CAD
TECHNIQUE: Bilateral screening digital craniocaudal and mediolateral oblique
mammograms were obtained. Bilateral screening digital breast
tomosynthesis was performed. The images were evaluated with
computer-aided detection.

[R CC synth-2D]
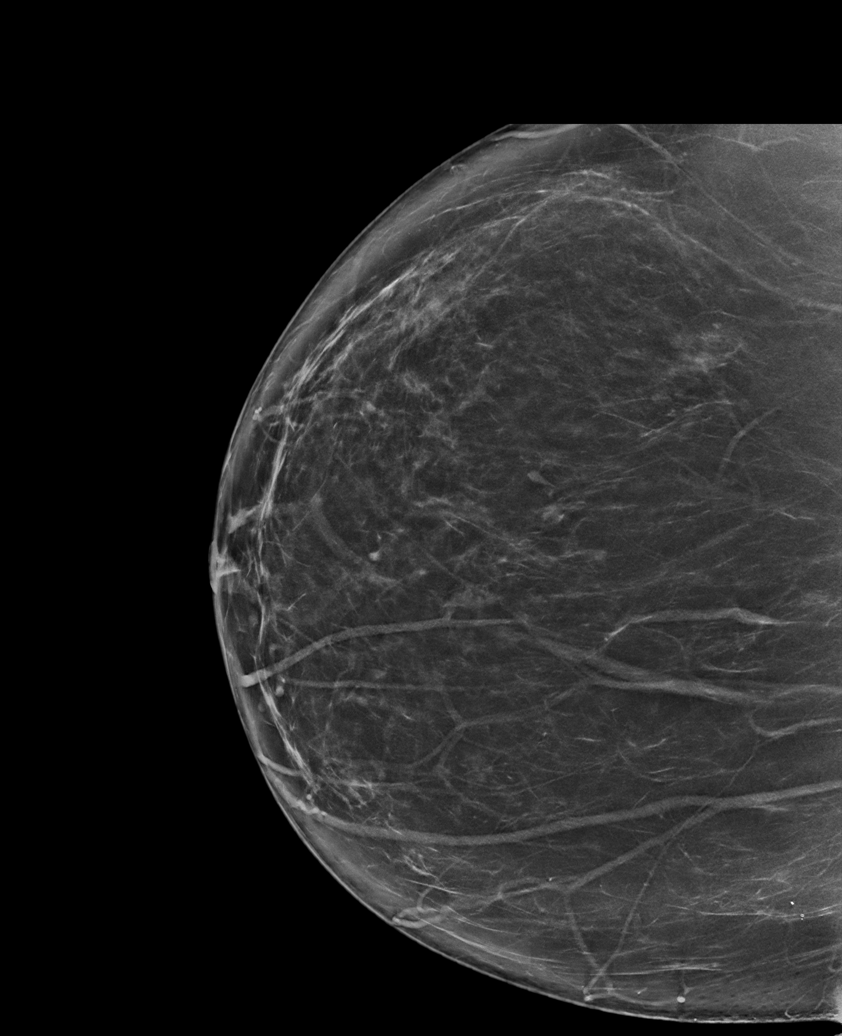

[L XCCL synth-2D]
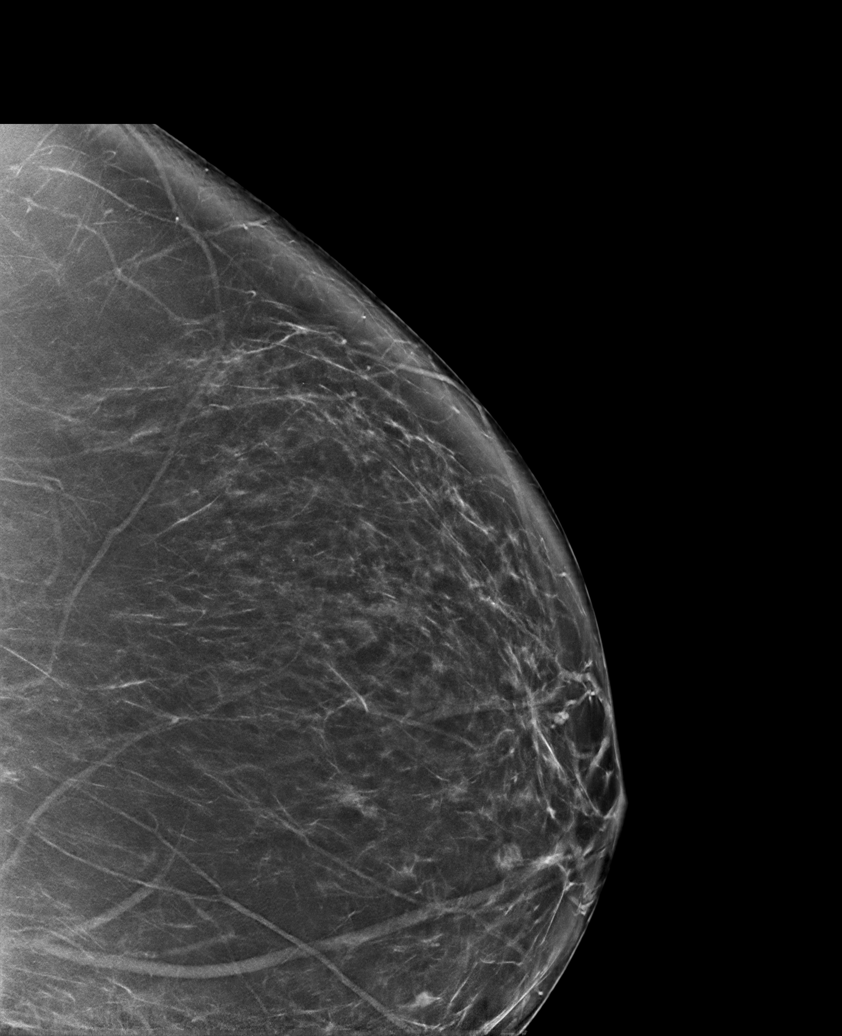

[L CC synth-2D]
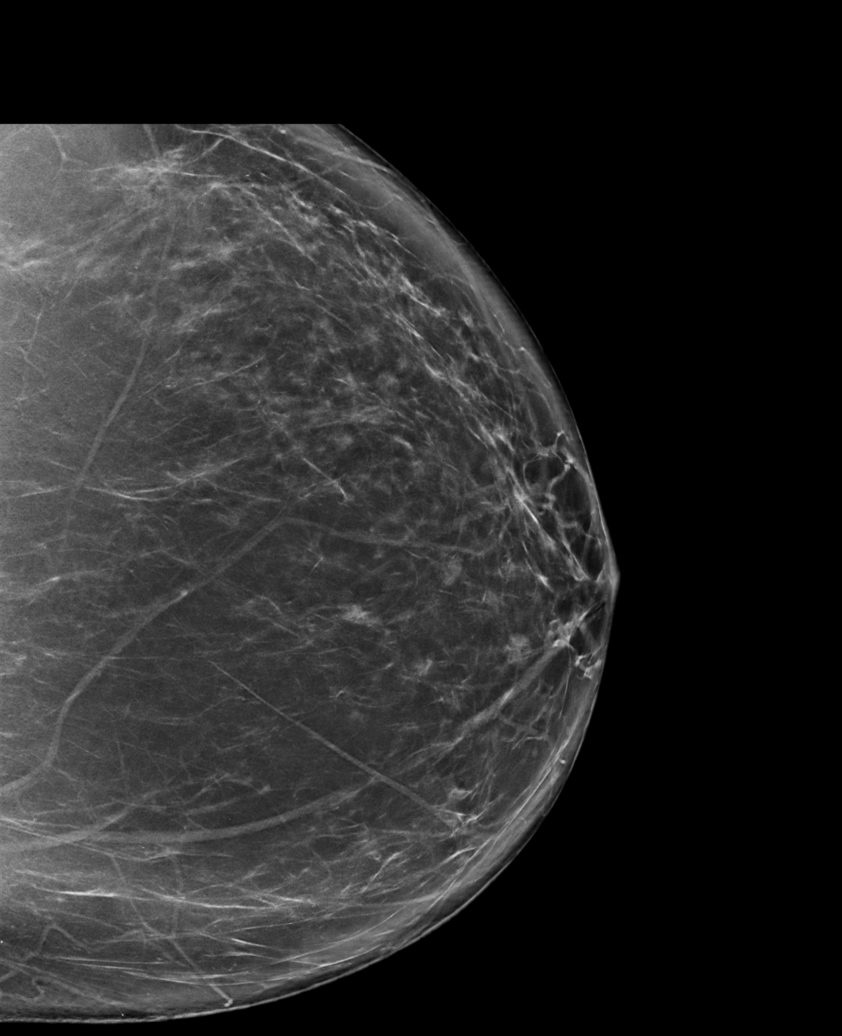

[L MLO synth-2D]
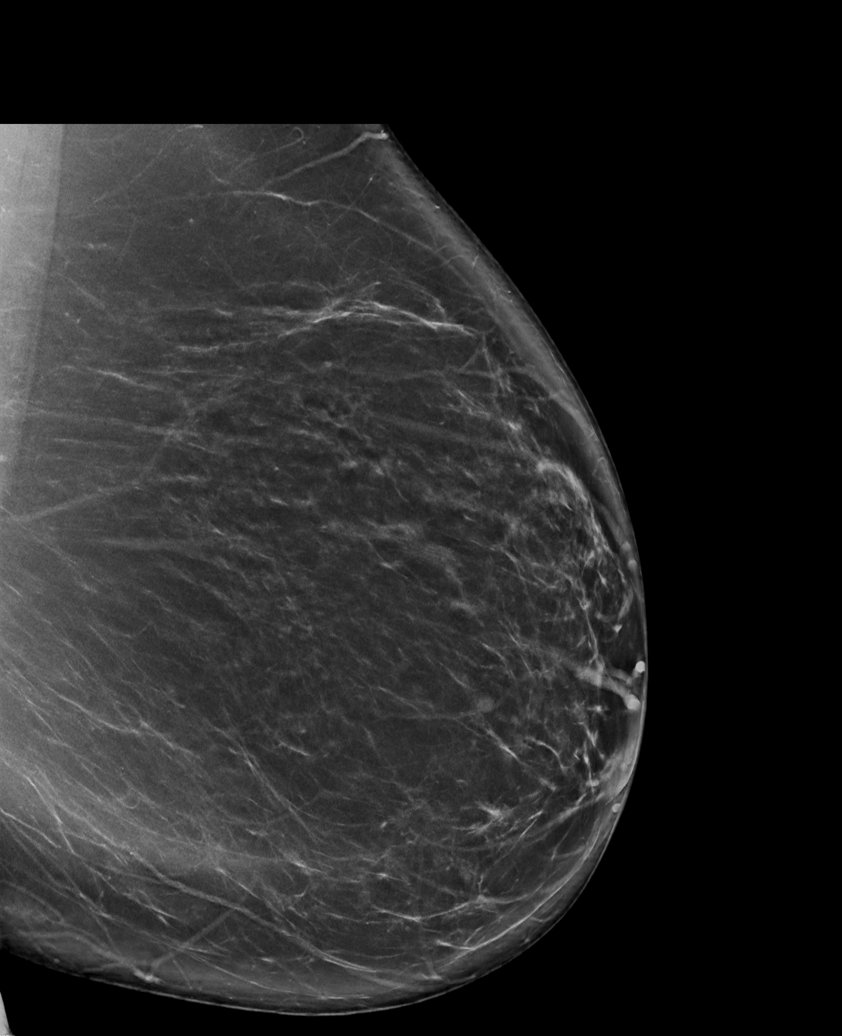

[R MLO synth-2D (1 of 2)]
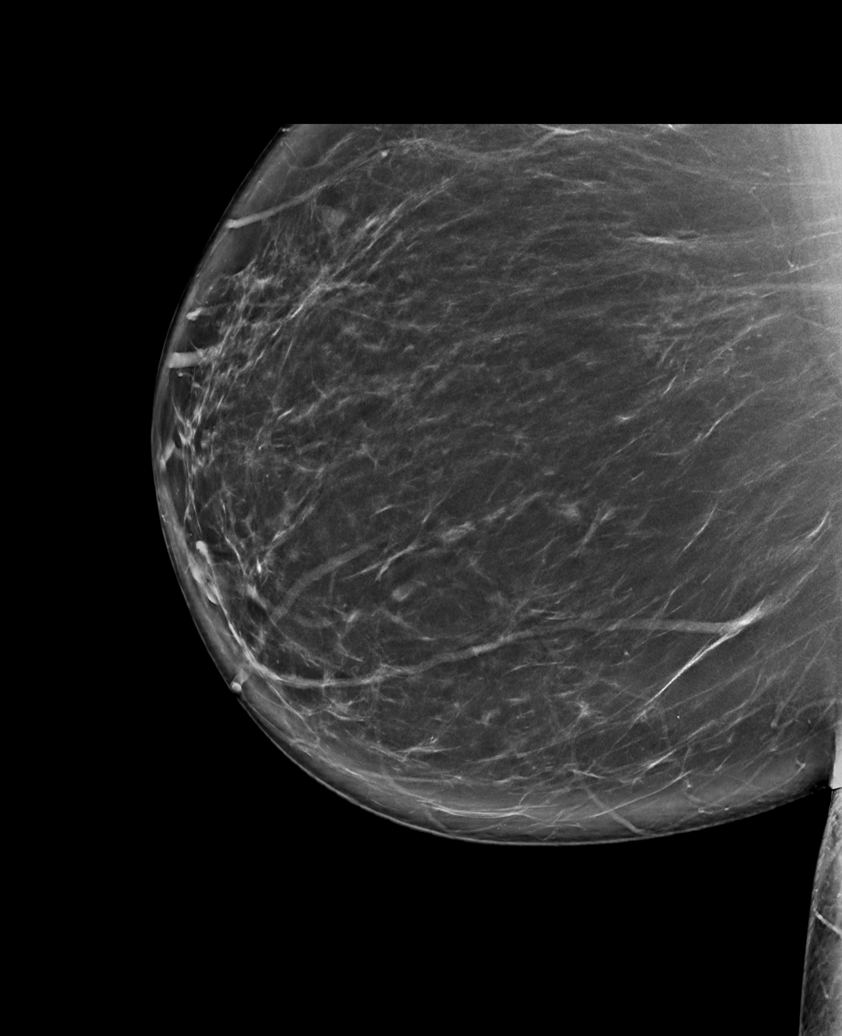

[R MLO synth-2D (2 of 2)]
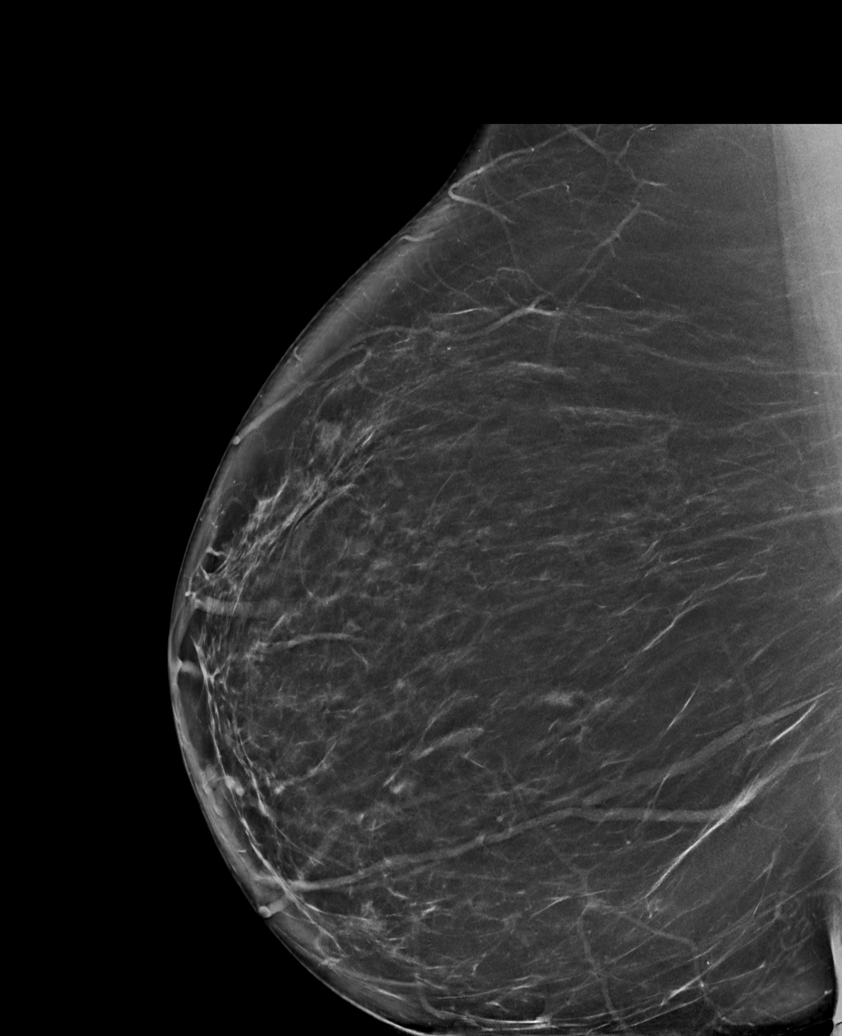

[6 of 36 positions shown; findings below may reference images not displayed]

ACR Breast Density Category b: There are scattered areas of
fibroglandular density.
FINDINGS: In the left breast, a possible mass warrants further evaluation. In
the right breast, no findings suspicious for malignancy.
IMPRESSION: Further evaluation is suggested for a possible mass in the left
breast.

RECOMMENDATION:
Diagnostic mammogram and possibly ultrasound of the left breast.
(Code:[id number]-X-44M)

The patient will be contacted regarding the findings, and additional
imaging will be scheduled.

BI-RADS CATEGORY  0: Incomplete. Need additional imaging evaluation
and/or prior mammograms for comparison.

## 2023-07-18 MED ORDER — AMOXICILLIN-POT CLAVULANATE 875-125 MG PO TABS: 1.0000 | ORAL_TABLET | Freq: Two times a day (BID) | ORAL | 0 refills | Status: DC

## 2023-07-18 MED ORDER — PROMETHAZINE-DM 6.25-15 MG/5ML PO SYRP: 5.0000 mL | ORAL_SOLUTION | Freq: Four times a day (QID) | ORAL | 0 refills | Status: AC | PRN

## 2023-07-18 NOTE — Progress Notes (Signed)

## 2023-07-19 ENCOUNTER — Other Ambulatory Visit (HOSPITAL_COMMUNITY): Payer: Self-pay

## 2023-07-21 ENCOUNTER — Other Ambulatory Visit (HOSPITAL_COMMUNITY): Payer: Self-pay

## 2023-07-21 ENCOUNTER — Other Ambulatory Visit: Payer: Self-pay

## 2023-07-21 DIAGNOSIS — G4733 Obstructive sleep apnea (adult) (pediatric): Secondary | ICD-10-CM | POA: Diagnosis not present

## 2023-07-21 MED ORDER — MOUNJARO 15 MG/0.5ML ~~LOC~~ SOAJ
15.0000 mg | SUBCUTANEOUS | 0 refills | Status: DC
Start: 2023-07-21 — End: 2023-10-08
  Filled 2023-07-21: qty 2, 28d supply, fill #0
  Filled 2023-08-13: qty 2, 28d supply, fill #1
  Filled 2023-09-10: qty 2, 28d supply, fill #2

## 2023-07-22 ENCOUNTER — Other Ambulatory Visit (HOSPITAL_COMMUNITY): Payer: Self-pay

## 2023-08-03 ENCOUNTER — Encounter (HOSPITAL_BASED_OUTPATIENT_CLINIC_OR_DEPARTMENT_OTHER): Payer: Self-pay

## 2023-08-03 ENCOUNTER — Ambulatory Visit (HOSPITAL_BASED_OUTPATIENT_CLINIC_OR_DEPARTMENT_OTHER)
Admission: RE | Admit: 2023-08-03 | Discharge: 2023-08-03 | Disposition: A | Discharge status: Home / Self Care | Source: Ambulatory Visit | Attending: Family Medicine | Admitting: Family Medicine

## 2023-08-03 ENCOUNTER — Ambulatory Visit (HOSPITAL_BASED_OUTPATIENT_CLINIC_OR_DEPARTMENT_OTHER)
Admit: 2023-08-03 | Discharge: 2023-08-03 | Disposition: A | Discharge status: Home / Self Care | Attending: Family Medicine | Admitting: Family Medicine

## 2023-08-03 VITALS — BP 120/84 | HR 107 | Temp 97.8°F | Resp 18

## 2023-08-03 DIAGNOSIS — S93401A Sprain of unspecified ligament of right ankle, initial encounter: Secondary | ICD-10-CM | POA: Diagnosis not present

## 2023-08-03 DIAGNOSIS — M7731 Calcaneal spur, right foot: Secondary | ICD-10-CM | POA: Diagnosis not present

## 2023-08-03 DIAGNOSIS — M25571 Pain in right ankle and joints of right foot: Secondary | ICD-10-CM

## 2023-08-03 DIAGNOSIS — M7989 Other specified soft tissue disorders: Secondary | ICD-10-CM | POA: Diagnosis not present

## 2023-08-03 DIAGNOSIS — M25471 Effusion, right ankle: Secondary | ICD-10-CM | POA: Diagnosis not present

## 2023-08-03 MED ORDER — CYCLOBENZAPRINE HCL 10 MG PO TABS: 10.0000 mg | ORAL_TABLET | Freq: Every evening | ORAL | 0 refills | Status: AC | PRN

## 2023-08-03 MED ORDER — DICLOFENAC SODIUM 50 MG PO TBEC: 50.0000 mg | DELAYED_RELEASE_TABLET | Freq: Three times a day (TID) | ORAL | 0 refills | Status: AC | PRN

## 2023-08-03 NOTE — ED Provider Notes (Signed)
 [facility]    CSN: [id number] Arrival date & time: 08/03/23  1315      History   Chief Complaint Chief Complaint  Patient presents with   Ankle Pain    Entered by patient    HPI Megan Duran is a 43 y.o. female.   Patient reports that she has had significant problems with sprains and strains of her right ankle and at one point had 16 weeks nonweightbearing time due to injury.  On the night of 08/01/2023, she turned and kind of twisted her ankle.  It did not seem as bad that day but by 08/02/2023, she was unable to sleep due to pain.  She is having swelling, bruising and pain in the right lateral ankle and some in the right medial ankle.  She has pain when walking on it.  She has been using an elastic ankle brace and that has been helpful.  She had a fracture in her foot in February 2024 and just wants to be sure that this is only a sprain.   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   Arthritis of both hands 02/20/2023   Diabetes mellitus (HCC) 08/23/2022   Hypertension associated with diabetes (HCC) 08/23/2022   Severe sleep apnea 02/28/2022   Insomnia 02/28/2022   Need for vaccination 11/26/2021   PHN (postherpetic neuralgia) 11/13/2021   Nasal turbinate hypertrophy 04/17/2021   Primary hypertension 12/05/2020   Morbid obesity with BMI of 40.0-44.9, adult (HCC) 12/05/2020   Encounter for general adult medical examination with abnormal findings 12/05/2020   LVH (left ventricular hypertrophy) due to hypertensive disease, without heart failure 12/05/2020   Visit for screening  mammogram 12/05/2020   Iron deficiency 12/22/2015   Vitamin D deficiency 12/22/2015   Gastroparesis 12/20/2015   IBS (irritable bowel syndrome) 12/20/2015   Osteoarthritis involving multiple joints on both sides of body 12/20/2015   GERD (gastroesophageal reflux disease) 12/20/2015    Past Surgical History:  Procedure Laterality Date   GALLBLADDER SURGERY  2007   NASAL SEPTOPLASTY W/ TURBINOPLASTY Bilateral 07/11/2021   Procedure: NASAL SEPTOPLASTY WITH TURBINATE REDUCTION;  Surgeon: Osborn Coho, MD;  Location: [facility];  Service: ENT;  Laterality: Bilateral;    OB History     Gravida  2   Para  2   Term  2   Preterm      AB      Living  2      SAB      IAB      Ectopic      Multiple  0   Live Births  1            Home Medications    Prior to Admission medications   Medication Sig Start Date End Date Taking? Authorizing Provider  cyclobenzaprine (FLEXERIL) 10 MG tablet Take 1 tablet (10 mg total) by mouth at bedtime as needed for up to 15 days for muscle spasms. Do not use and drive. 08/03/23 08/18/23 Yes Prescilla Sours,  FNP  diclofenac (VOLTAREN) 50 MG EC tablet Take 1 tablet (50 mg total) by mouth 3 (three) times daily with meals as needed for up to 15 days (ankle pain.). 08/03/23 08/18/23 Yes Prescilla Sours, FNP  albuterol (VENTOLIN HFA) 108 (90 Base) MCG/ACT inhaler Inhale 2 puffs into the lungs every 4 (four) hours as needed for wheezing or shortness of breath. 06/13/23   Zenia Resides, MD  budesonide-formoterol [facility]) 160-4.5 MCG/ACT inhaler Inhale 2 puffs into the lungs 2 (two) times daily. 06/23/23   Glenford Bayley, NP  Cholecalciferol (VITAMIN D3) POWD Take by mouth. 04/17/21   [provider]  fluticasone (FLONASE) 50 MCG/ACT nasal spray Place 2 sprays into both nostrils daily. 07/24/22   Junie Spencer, FNP  levonorgestrel (MIRENA, 52 MG,) 20 MCG/24HR IUD Mirena 20 mcg/24 hours (6 yrs) 52 mg intrauterine device   Take 1 device by intrauterine route.    [provider]  loratadine (CLARITIN) 10 MG tablet Take 10 mg by mouth daily. 12/20/15   [provider]  Multiple Vitamin (MULTIVITAMIN PO) Take by mouth.    [provider]  nebivolol (BYSTOLIC) 5 MG tablet Take 1 tablet (5 mg total) by mouth daily. 05/15/23   Etta Grandchild, MD  olmesartan (BENICAR) 20 MG tablet Take 1 tablet (20 mg total) by mouth daily. 05/20/23   Etta Grandchild, MD  omeprazole (PRILOSEC) 40 MG capsule Take 1 capsule (40 mg total) by mouth 2 (two) times daily. 06/20/22   Kozlow, Alvira Philips, MD  ondansetron (ZOFRAN-ODT) 4 MG disintegrating tablet Take 1 tablet (4 mg total) by mouth every 8 (eight) hours as needed for nausea or vomiting. 06/13/23   Zenia Resides, MD  tirzepatide [facility]) 15 MG/0.5ML Pen Inject 15 mg into the skin once a week. 07/21/23   Etta Grandchild, MD  traZODone (DESYREL) 150 MG tablet Take 1 tablet (150 mg total) by mouth at bedtime. 06/23/23   Glenford Bayley, NP  VITAMIN D PO Take by mouth daily.    [provider]    Family History Family History  Problem Relation Age of Onset   Anxiety disorder Mother    Depression Mother    Heart Problems Mother    Hypertension Mother    Diabetes Mother    AVM Mother    Stroke Mother    Atrial fibrillation Mother    Heart attack Mother    Fibromyalgia Mother    Breast cancer Mother 76       recur @ 80   Congestive Heart Failure Mother    Hypertension Father    Diabetes Father    Obesity Father    Heart attack Father    High Cholesterol Father    Depression Sister    Pancreatitis Sister    Diabetes Sister    Hypertension Sister    GER disease Sister    High Cholesterol Sister    Migraines Sister    Pancreatic cancer Maternal Aunt    Pancreatic cancer Maternal Uncle    Dementia Maternal Grandmother    Hypertension Maternal Grandmother    Hyperthyroidism Maternal Grandmother    Throat cancer Maternal Grandfather    Heart  attack Paternal Grandmother     Social History Social History   Tobacco Use   Smoking status: Never    Passive exposure: Past   Smokeless tobacco: Never  Vaping Use   Vaping status: Never Used  Substance Use Topics   Alcohol use: No  Alcohol/week: 0.0 standard drinks of alcohol   Drug use: Never     Allergies   Bactrim [sulfamethoxazole-trimethoprim], Biaxin [clarithromycin], Doxycycline, Effexor [venlafaxine], Erythromycin, Imitrex [sumatriptan], Keflex [cephalexin], and Minocycline   Review of Systems Review of Systems  Constitutional:  Negative for fever.  Respiratory:  Negative for cough.   Cardiovascular:  Negative for chest pain.  Gastrointestinal:  Negative for abdominal pain, constipation, diarrhea, nausea and vomiting.  Musculoskeletal:  Positive for joint swelling (right ankle). Negative for arthralgias and back pain.  Skin:  Negative for color change and rash.  Neurological:  Negative for syncope.  All other systems reviewed and are negative.    Physical Exam Triage Vital Signs ED Triage Vitals  Encounter Vitals Group     BP 08/03/23 1334 120/84     Systolic BP Percentile --      Diastolic BP Percentile --      Pulse Rate 08/03/23 1334 (!) 107     Resp 08/03/23 1334 18     Temp 08/03/23 1334 97.8 F (36.6 C)     Temp Source 08/03/23 1334 Oral     SpO2 08/03/23 1334 96 %     Weight --      Height --      Head Circumference --      Peak Flow --      Pain Score 08/03/23 1333 7     Pain Loc --      Pain Education --      Exclude from Growth Chart --    No data found.  Updated Vital Signs BP 120/84 (BP Location: Right Arm)   Pulse (!) 107   Temp 97.8 F (36.6 C) (Oral)   Resp 18   SpO2 96%   Visual Acuity Right Eye Distance:   Left Eye Distance:   Bilateral Distance:    Right Eye Near:   Left Eye Near:    Bilateral Near:     Physical Exam Vitals and nursing note reviewed.  Constitutional:      General: She is not in acute  distress.    Appearance: She is well-developed. She is not ill-appearing or toxic-appearing.  HENT:     Head: Normocephalic and atraumatic.     Right Ear: External ear normal.     Left Ear: External ear normal.     Nose: Nose normal.     Mouth/Throat:     Lips: Pink.     Mouth: Mucous membranes are moist.  Eyes:     Conjunctiva/sclera: Conjunctivae normal.     Pupils: Pupils are equal, round, and reactive to light.  Cardiovascular:     Rate and Rhythm: Normal rate and regular rhythm.     Heart sounds: S1 normal and S2 normal. No murmur heard. Pulmonary:     Effort: Pulmonary effort is normal. No respiratory distress.     Breath sounds: Normal breath sounds. No decreased breath sounds, wheezing, rhonchi or rales.  Musculoskeletal:        General: No swelling.     Right lower leg: Normal.     Left lower leg: Normal.     Right ankle: Swelling (Moderate around the lateral malleolus and mild around the medial malleolus) and ecchymosis (Moderate around the lateral malleolus and mild around the medial malleolus) present. Tenderness (Moderate around the lateral malleolus and mild around the medial malleolus) present. Decreased range of motion (due to pain).     Left ankle: Normal.  Skin:    General: Skin is  warm and dry.     Capillary Refill: Capillary refill takes less than 2 seconds.     Findings: No rash.  Neurological:     Mental Status: She is alert and oriented to person, place, and time.  Psychiatric:        Mood and Affect: Mood normal.      UC Treatments / Results  Labs (all labs ordered are listed, but only abnormal results are displayed) Basic metabolic panel:02/20/23:      Component Ref Range & Units (hover) 5 mo ago (02/20/23) 11 mo ago (08/22/22)  Sodium 134 Low  135  Potassium 4.1 4.5  Chloride 102 100  CO2 26 26  Glucose, Bld 115 High  118 High   BUN 16 17  Creatinine, Ser 0.87 0.90  GFR 81.50 78.52 CM  Comment: Calculated using the CKD-EPI Creatinine  Equation (2021)  Calcium 9.7 9.7  Resulting Agency [place] [facility] [place] [facility]      EKG   Radiology No results found.  Procedures Procedures (including critical care time)  Medications Ordered in UC Medications - No data to display  Initial Impression / Assessment and Plan / UC Course  I have reviewed the triage vital signs and the nursing notes.  Pertinent labs & imaging results that were available during my care of the patient were reviewed by me and considered in my medical decision making (see chart for details).     X-rays appear negative.  Provided a ankle brace with metal supports for prevention of lateral stress.  Encouraged RICE therapy.  Cyclobenzaprine 10 mg nightly for muscle spasms, do not use and drive.  Diclofenac, 50 mg every 8 hours with food if needed for pain.  Work excuse provided.  Follow-up if symptoms do not improve, worsen or new symptoms occur.  Final Clinical Impressions(s) / UC Diagnoses   Final diagnoses:  Pain and swelling of right ankle  Sprain of right ankle, unspecified ligament, initial encounter     Discharge Instructions      X-rays appear negative.  Will update the patient if the radiology review differs.  Continue with elastic ankle brace.  Provided supportive ankle brace with metal stays for lateral support.  Encouraged RICE therapy.  Provided cyclobenzaprine, 10 mg 1 nightly for muscle spasms.  Do not use and and drive.  Provided diclofenac, 50 mg, every 8 hours with food, if needed for pain.  Work excuse provided.  Follow-up if symptoms do not improve, worsen or new symptoms occur.     ED Prescriptions     Medication Sig Dispense Auth. Provider   cyclobenzaprine (FLEXERIL) 10 MG tablet Take 1 tablet (10 mg total) by mouth at bedtime as needed for up to 15 days for muscle spasms. Do not use and drive. 15 tablet Prescilla Sours, FNP   diclofenac (VOLTAREN) 50 MG EC tablet Take 1 tablet (50 mg total) by mouth 3 (three) times  daily with meals as needed for up to 15 days (ankle pain.). 45 tablet Prescilla Sours, FNP      PDMP not reviewed this encounter.   Prescilla Sours, FNP 08/03/23 [phone]

## 2023-08-03 NOTE — Progress Notes (Signed)
 Right Ankle X-Ray IMPRESSION:   No acute osseous abnormality.  Patient was updated on these results during her visit.  No change in the plan of care.

## 2023-08-03 NOTE — Discharge Instructions (Addendum)
 X-rays appear negative.  Will update the patient if the radiology review differs.  Continue with elastic ankle brace.  Provided supportive ankle brace with metal stays for lateral support.  Encouraged RICE therapy.  Provided cyclobenzaprine, 10 mg 1 nightly for muscle spasms.  Do not use and and drive.  Provided diclofenac, 50 mg, every 8 hours with food, if needed for pain.  Work excuse provided.  Follow-up if symptoms do not improve, worsen or new symptoms occur.

## 2023-08-03 NOTE — ED Triage Notes (Signed)
 Pt presents with right ankle and foot pain x 2 days. Pt states she slightly twisted her ankle but the pain became worse last night. Pt reports a fracture in that foot 06/19/2022.

## 2023-08-10 ENCOUNTER — Other Ambulatory Visit: Payer: Self-pay | Admitting: Internal Medicine

## 2023-08-10 DIAGNOSIS — I10 Essential (primary) hypertension: Secondary | ICD-10-CM

## 2023-08-10 DIAGNOSIS — R000 Tachycardia, unspecified: Secondary | ICD-10-CM

## 2023-08-11 ENCOUNTER — Other Ambulatory Visit (HOSPITAL_COMMUNITY): Payer: Self-pay

## 2023-08-11 ENCOUNTER — Other Ambulatory Visit: Payer: Self-pay

## 2023-08-11 MED ORDER — NEBIVOLOL HCL 5 MG PO TABS
5.0000 mg | ORAL_TABLET | Freq: Every day | ORAL | 0 refills | Status: DC
  Filled 2023-08-11: qty 90, 90d supply, fill #0

## 2023-08-13 ENCOUNTER — Other Ambulatory Visit (HOSPITAL_COMMUNITY): Payer: Self-pay

## 2023-08-15 ENCOUNTER — Other Ambulatory Visit: Payer: Self-pay | Admitting: Internal Medicine

## 2023-08-15 ENCOUNTER — Other Ambulatory Visit (HOSPITAL_COMMUNITY): Payer: Self-pay

## 2023-08-15 DIAGNOSIS — I10 Essential (primary) hypertension: Secondary | ICD-10-CM

## 2023-08-15 MED ORDER — OLMESARTAN MEDOXOMIL 20 MG PO TABS
20.0000 mg | ORAL_TABLET | Freq: Every day | ORAL | 0 refills | Status: DC
  Filled 2023-08-15: qty 90, 90d supply, fill #0

## 2023-08-18 ENCOUNTER — Other Ambulatory Visit (HOSPITAL_COMMUNITY): Payer: Self-pay

## 2023-08-21 ENCOUNTER — Ambulatory Visit: Payer: Commercial Managed Care - PPO | Admitting: Internal Medicine

## 2023-08-21 ENCOUNTER — Encounter: Payer: Self-pay | Admitting: Internal Medicine

## 2023-08-21 ENCOUNTER — Other Ambulatory Visit: Payer: Self-pay

## 2023-08-21 VITALS — BP 118/78 | HR 104 | Temp 98.6°F | Resp 16 | Ht 65.0 in | Wt 317.8 lb

## 2023-08-21 DIAGNOSIS — Z6841 Body Mass Index (BMI) 40.0 and over, adult: Secondary | ICD-10-CM

## 2023-08-21 DIAGNOSIS — M19041 Primary osteoarthritis, right hand: Secondary | ICD-10-CM | POA: Diagnosis not present

## 2023-08-21 DIAGNOSIS — E611 Iron deficiency: Secondary | ICD-10-CM | POA: Diagnosis not present

## 2023-08-21 DIAGNOSIS — E1159 Type 2 diabetes mellitus with other circulatory complications: Secondary | ICD-10-CM | POA: Diagnosis not present

## 2023-08-21 DIAGNOSIS — E1169 Type 2 diabetes mellitus with other specified complication: Secondary | ICD-10-CM | POA: Diagnosis not present

## 2023-08-21 DIAGNOSIS — Z7985 Long-term (current) use of injectable non-insulin antidiabetic drugs: Secondary | ICD-10-CM

## 2023-08-21 DIAGNOSIS — I10 Essential (primary) hypertension: Secondary | ICD-10-CM | POA: Diagnosis not present

## 2023-08-21 DIAGNOSIS — I152 Hypertension secondary to endocrine disorders: Secondary | ICD-10-CM

## 2023-08-21 DIAGNOSIS — R000 Tachycardia, unspecified: Secondary | ICD-10-CM

## 2023-08-21 DIAGNOSIS — M19042 Primary osteoarthritis, left hand: Secondary | ICD-10-CM | POA: Diagnosis not present

## 2023-08-21 LAB — URINALYSIS, ROUTINE W REFLEX MICROSCOPIC
Bilirubin Urine: NEGATIVE
Hgb urine dipstick: NEGATIVE
Ketones, ur: NEGATIVE
Leukocytes,Ua: NEGATIVE
Nitrite: NEGATIVE
RBC / HPF: NONE SEEN (ref 0–?)
Specific Gravity, Urine: 1.01 (ref 1.000–1.030)
Total Protein, Urine: NEGATIVE
Urine Glucose: NEGATIVE
Urobilinogen, UA: 0.2 (ref 0.0–1.0)
WBC, UA: NONE SEEN (ref 0–?)
pH: 6 (ref 5.0–8.0)

## 2023-08-21 LAB — IBC + FERRITIN
Ferritin: 20.7 ng/mL (ref 10.0–291.0)
Iron: 44 ug/dL (ref 42–145)
Saturation Ratios: 10.1 % — ABNORMAL LOW (ref 20.0–50.0)
TIBC: 434 ug/dL (ref 250.0–450.0)
Transferrin: 310 mg/dL (ref 212.0–360.0)

## 2023-08-21 LAB — TSH: TSH: 0.71 u[IU]/mL (ref 0.35–5.50)

## 2023-08-21 LAB — CBC WITH DIFFERENTIAL/PLATELET
Basophils Absolute: 0 10*3/uL (ref 0.0–0.1)
Basophils Relative: 0.3 % (ref 0.0–3.0)
Eosinophils Absolute: 0.1 10*3/uL (ref 0.0–0.7)
Eosinophils Relative: 0.6 % (ref 0.0–5.0)
HCT: 39.3 % (ref 36.0–46.0)
Hemoglobin: 12.5 g/dL (ref 12.0–15.0)
Lymphocytes Relative: 11.9 % — ABNORMAL LOW (ref 12.0–46.0)
Lymphs Abs: 1.5 10*3/uL (ref 0.7–4.0)
MCHC: 31.8 g/dL (ref 30.0–36.0)
MCV: 93.3 fl (ref 78.0–100.0)
Monocytes Absolute: 0.8 10*3/uL (ref 0.1–1.0)
Monocytes Relative: 5.9 % (ref 3.0–12.0)
Neutro Abs: 10.5 10*3/uL — ABNORMAL HIGH (ref 1.4–7.7)
Neutrophils Relative %: 81.3 % — ABNORMAL HIGH (ref 43.0–77.0)
Platelets: 300 10*3/uL (ref 150.0–400.0)
RBC: 4.21 Mil/uL (ref 3.87–5.11)
RDW: 15.1 % (ref 11.5–15.5)
WBC: 12.9 10*3/uL — ABNORMAL HIGH (ref 4.0–10.5)

## 2023-08-21 LAB — BASIC METABOLIC PANEL WITH GFR
BUN: 16 mg/dL (ref 6–23)
CO2: 26 meq/L (ref 19–32)
Calcium: 9.4 mg/dL (ref 8.4–10.5)
Chloride: 101 meq/L (ref 96–112)
Creatinine, Ser: 0.99 mg/dL (ref 0.40–1.20)
GFR: 69.55 mL/min (ref >60.00)
Glucose, Bld: 99 mg/dL (ref 70–99)
Potassium: 4.4 meq/L (ref 3.5–5.1)
Sodium: 135 meq/L (ref 135–145)

## 2023-08-21 LAB — HEPATIC FUNCTION PANEL
ALT: 28 U/L (ref 0–35)
AST: 18 U/L (ref 0–37)
Albumin: 4.5 g/dL (ref 3.5–5.2)
Alkaline Phosphatase: 87 U/L (ref 39–117)
Bilirubin, Direct: 0 mg/dL (ref 0.0–0.3)
Total Bilirubin: 0.3 mg/dL (ref 0.2–1.2)
Total Protein: 7.7 g/dL (ref 6.0–8.3)

## 2023-08-21 LAB — C-REACTIVE PROTEIN: CRP: <1 mg/dL (ref 0.5–20.0)

## 2023-08-21 LAB — MICROALBUMIN / CREATININE URINE RATIO
Creatinine,U: 108.4 mg/dL
Microalb Creat Ratio: 11.7 mg/g (ref 0.0–30.0)
Microalb, Ur: 1.3 mg/dL (ref 0.0–1.9)

## 2023-08-21 LAB — HEMOGLOBIN A1C: Hgb A1c MFr Bld: 6 % (ref 4.6–6.5)

## 2023-08-21 MED ORDER — NEBIVOLOL HCL 10 MG PO TABS
10.0000 mg | ORAL_TABLET | Freq: Every day | ORAL | 1 refills | Status: DC
  Filled 2023-08-21: qty 90, 90d supply, fill #0
  Filled 2023-11-15 – 2023-11-17 (×2): qty 90, 90d supply, fill #1

## 2023-08-21 NOTE — Patient Instructions (Signed)
Sinus Tachycardia  Sinus tachycardia is a fast heartbeat. In sinus tachycardia, the heart beats more than 100 times a minute. Sinus tachycardia starts in the part of the heart called the sinoatrial (SA) node. Sinus tachycardia may be harmless, or it may be a sign of a serious condition. What are the causes? This condition may be caused by: Exercise or exertion. A fever. Pain. Loss of body fluids (dehydration). Severe bleeding (hemorrhage). Anxiety and stress. Certain substances, including: Alcohol. Caffeine. Tobacco and nicotine products. Cold medicines. Illegal drugs. Medical conditions including: Heart disease. An infection. An overactive thyroid (hyperthyroidism). A lack of red blood cells (anemia). What are the signs or symptoms? Symptoms of this condition include: A feeling that the heart is beating fast or unevenly (palpitations). Suddenly noticing your heartbeat (cardiac awareness). Lightheadedness. Tiredness (fatigue). Shortness of breath. Chest pain. Nausea. Fainting. How is this diagnosed? This condition is diagnosed with: A physical exam. Tests or monitoring, such as: Blood tests. An electrocardiogram (ECG). This test measures the electrical activity of the heart. Ambulatory cardiac monitor. This records your heartbeats for 24 hours or more. You may be referred to a heart specialist (cardiologist). How is this treated? Treatment for this condition depends on the cause. Treatment may involve: Treating the underlying condition. Taking new medicines or changing your current medicines as told by your health care provider. Making changes to your diet or lifestyle. Follow these instructions at home: Lifestyle  Do not use any products that contain nicotine or tobacco. These products include cigarettes, chewing tobacco, and vaping devices, such as e-cigarettes. If you need help quitting, ask your health care provider. Do not use illegal drugs, such as  cocaine. Learn relaxation methods to help you when you get stressed or anxious. These include deep breathing. Avoid caffeine or other stimulants, including herbal stimulants that are found in energy drinks. Alcohol use  Do not drink alcohol if: Your health care provider tells you not to drink. You are pregnant, may be pregnant, or are planning to become pregnant. If you drink alcohol: Limit how much you have to: 0-1 drink a day for women. 0-2 drinks a day for men. Know how much alcohol is in your drink. In the [place] one drink equals one 12 oz bottle of beer (355 mL), one 5 oz glass of wine (148 mL), or one 1 oz glass of hard liquor (44 mL). General instructions Drink enough fluids to keep your urine pale yellow. Take over-the-counter and prescription medicines only as told by your health care provider. Ask your health care provider about taking vitamins, herbs, and supplements. Contact a health care provider if: You have vomiting or diarrhea that does not go away. You have a fever. You have weakness or dizziness. You feel faint. Get help right away if: You have pain in your chest, upper arms, jaw, or neck. You have palpitations that do not go away. Summary In sinus tachycardia, the heart beats more than 100 times a minute. Sinus tachycardia may be harmless, or it may be a sign of a serious condition. Treatment for this condition depends on the cause or the underlying condition. Get help right away if you have pain in your chest, upper arms, jaw, or neck. This information is not intended to replace advice given to you by your health care provider. Make sure you discuss any questions you have with your health care provider. Document Revised: 08/28/2021 Document Reviewed: 08/28/2021 Elsevier Patient Education  2024 [employer]  

## 2023-08-21 NOTE — Progress Notes (Unsigned)
 Subjective:  Patient ID: Megan Duran, female    DOB: 07/09/1980  Age: 43 y.o. MRN: [id number]  CC: Diabetes and Hypertension   HPI Megan Duran presents for f/up ---  Discussed the use of AI scribe software for clinical note transcription with the patient, who gave verbal consent to proceed.  History of Present Illness   Megan Duran is a 43 year old female who presents with palpitations and joint pain.  She experiences palpitations described as 'extra beats' and sometimes a racing heart rate. These episodes occur intermittently and are sometimes accompanied by dizziness, though she has not experienced syncope.   She has ongoing joint pain, particularly in her hands, wrists, and ankles. The pain in her hands affects activities such as driving, with prolonged positioning exacerbating the discomfort. She notes that her pinkies, especially the right one, have started to bow out more than before. She takes Aleve for the pain but feels it may not be sufficient.  She experiences occasional heartburn or indigestion, particularly in the evenings, for which she takes omeprazole.  She mentions feeling anemic at times and takes a Flintstones vitamin with iron.  She is scheduled for an eye exam on the 29th of this month and a GYN appointment on the 30th. She reports good hydration habits.       Outpatient Medications Prior to Visit  Medication Sig Dispense Refill   albuterol (VENTOLIN HFA) 108 (90 Base) MCG/ACT inhaler Inhale 2 puffs into the lungs every 4 (four) hours as needed for wheezing or shortness of breath. 6.7 g 0   budesonide-formoterol (SYMBICORT) 160-4.5 MCG/ACT inhaler Inhale 2 puffs into the lungs 2 (two) times daily. 10.2 g 3   Cholecalciferol (VITAMIN D3) POWD Take by mouth.     fluticasone (FLONASE) 50 MCG/ACT nasal spray Place 2 sprays into both nostrils daily. 16 g 6   levonorgestrel (MIRENA, 52 MG,) 20 MCG/24HR IUD Mirena 20 mcg/24 hours (6 yrs) 52 mg  intrauterine device  Take 1 device by intrauterine route.     loratadine (CLARITIN) 10 MG tablet Take 10 mg by mouth daily.     Multiple Vitamin (MULTIVITAMIN PO) Take by mouth.     omeprazole (PRILOSEC) 40 MG capsule Take 1 capsule (40 mg total) by mouth 2 (two) times daily. 60 capsule 2   ondansetron (ZOFRAN-ODT) 4 MG disintegrating tablet Take 1 tablet (4 mg total) by mouth every 8 (eight) hours as needed for nausea or vomiting. 10 tablet 0   tirzepatide (MOUNJARO) 15 MG/0.5ML Pen Inject 15 mg into the skin once a week. 6 mL 0   traZODone (DESYREL) 150 MG tablet Take 1 tablet (150 mg total) by mouth at bedtime. 30 tablet 3   VITAMIN D PO Take by mouth daily.     nebivolol (BYSTOLIC) 5 MG tablet Take 1 tablet (5 mg total) by mouth daily. 90 tablet 0   olmesartan (BENICAR) 20 MG tablet Take 1 tablet (20 mg total) by mouth daily. 90 tablet 0   No facility-administered medications prior to visit.    ROS Review of Systems  Constitutional:  Negative for appetite change, chills, diaphoresis, fatigue and fever.  HENT: Negative.  Negative for sore throat and trouble swallowing.   Eyes: Negative.   Respiratory:  Negative for cough, chest tightness, shortness of breath and wheezing.   Cardiovascular:  Positive for palpitations. Negative for chest pain and leg swelling.  Gastrointestinal: Negative.  Negative for abdominal pain, diarrhea, nausea and vomiting.  Endocrine: Negative.  Genitourinary: Negative.  Negative for difficulty urinating.  Musculoskeletal:  Positive for arthralgias. Negative for back pain and myalgias.  Skin: Negative.   Neurological:  Positive for dizziness and light-headedness.  Hematological:  Negative for adenopathy. Does not bruise/bleed easily.  Psychiatric/Behavioral: Negative.      Objective:  BP 118/78 (BP Location: Left Arm, Patient Position: Sitting, Cuff Size: Large)   Pulse (!) 104   Temp 98.6 F (37 C) (Temporal)   Resp 16   Ht 5\' 5"  (1.651 m)   Wt (!)  317 lb 12.8 oz (144.2 kg)   LMP  (LMP Unknown)   SpO2 99%   Breastfeeding No   BMI 52.88 kg/m   BP Readings from Last 3 Encounters:  08/21/23 118/78  08/03/23 120/84  06/23/23 126/84    Wt Readings from Last 3 Encounters:  08/21/23 (!) 317 lb 12.8 oz (144.2 kg)  06/23/23 (!) 314 lb 3.2 oz (142.5 kg)  02/20/23 (!) 322 lb 12.8 oz (146.4 kg)    Physical Exam Vitals reviewed.  Constitutional:      Appearance: She is obese. She is not ill-appearing.  HENT:     Nose: Nose normal.     Mouth/Throat:     Mouth: Mucous membranes are moist.  Eyes:     General: No scleral icterus.    Conjunctiva/sclera: Conjunctivae normal.  Cardiovascular:     Rate and Rhythm: Regular rhythm. Tachycardia present.     Heart sounds: No murmur heard.    No friction rub. No gallop.     Comments: EKG-- ST, 103 bpm No LVH, Q waves, or ST/T wave changes  Pulmonary:     Effort: Pulmonary effort is normal.     Breath sounds: No stridor. No wheezing, rhonchi or rales.  Abdominal:     General: Abdomen is protuberant. Bowel sounds are normal. There is no distension.     Palpations: Abdomen is soft. There is no hepatomegaly, splenomegaly or mass.     Tenderness: There is no abdominal tenderness. There is no guarding or rebound.  Musculoskeletal:        General: Deformity (OA) present. No swelling. Normal range of motion.     Cervical back: Neck supple.     Right lower leg: No edema.     Left lower leg: No edema.  Lymphadenopathy:     Cervical: No cervical adenopathy.  Skin:    General: Skin is warm and dry.     Findings: No rash.  Neurological:     General: No focal deficit present.     Mental Status: She is alert.  Psychiatric:        Mood and Affect: Mood normal.        Behavior: Behavior normal.     Lab Results  Component Value Date   WBC 12.9 (H) 08/21/2023   HGB 12.5 08/21/2023   HCT 39.3 08/21/2023   PLT 300.0 08/21/2023   GLUCOSE 99 08/21/2023   CHOL 288 (A) 11/19/2021   TRIG 118  11/19/2021   HDL 51 11/19/2021   LDLCALC 216 11/19/2021   ALT 28 08/21/2023   AST 18 08/21/2023   NA 135 08/21/2023   K 4.4 08/21/2023   CL 101 08/21/2023   CREATININE 0.99 08/21/2023   BUN 16 08/21/2023   CO2 26 08/21/2023   TSH 0.71 08/21/2023   HGBA1C 6.0 08/21/2023   MICROALBUR 1.3 08/21/2023    DG Ankle Complete Right Result Date: 08/03/2023 CLINICAL DATA:  Right ankle pain and swelling. No  trauma history is submitted. EXAM: RIGHT ANKLE - COMPLETE 3+ VIEW COMPARISON:  06/19/2022 foot and ankle radiographs FINDINGS: No acute fracture or dislocation. Tiny Achilles and calcaneal spurs. Interval healing of previously described base of fifth metatarsal fracture. No significant soft tissue swelling. IMPRESSION: No acute osseous abnormality. Electronically Signed   By: Lore Rode M.D.   On: 08/03/2023 14:46    Assessment & Plan:   Type 2 diabetes mellitus with other specified complication, without long-term current use of insulin (HCC)- Blood sugar is well controlled. -     Basic metabolic panel with GFR; Future -     Urinalysis, Routine w reflex microscopic; Future -     Hemoglobin A1c; Future -     Microalbumin / creatinine urine ratio; Future -     HM Diabetes Foot Exam  Iron deficiency -     CBC with Differential/Platelet; Future -     IBC + Ferritin; Future  Primary hypertension- BP is over-controlled. Will discontinue the ARB -     Basic metabolic panel with GFR; Future -     CBC with Differential/Platelet; Future -     Urinalysis, Routine w reflex microscopic; Future  Tachycardia- HR > 100. Will increase the dose of the BB. -     EKG 12-Lead -     TSH; Future -     Nebivolol HCl; Take 1 tablet (10 mg total) by mouth daily.  Dispense: 90 tablet; Refill: 1  Arthritis of both hands- Labs are normal. Will treat for OA. -     ANA; Future -     C-reactive protein; Future -     Rheumatoid factor; Future -     Cyclic citrul peptide antibody, IgG; Future -     B.  burgdorfi antibodies by WB; Future -     Celecoxib; Take 1 capsule (100 mg total) by mouth daily.  Dispense: 90 capsule; Refill: 0  Hypertension associated with diabetes (HCC) -     TSH; Future -     Hepatic function panel; Future  Morbid obesity with BMI of 40.0-44.9, adult (HCC) -     Hepatic function panel; Future     Follow-up: Return in about 6 months (around 02/20/2024).  Sandra Crouch, MD

## 2023-08-24 MED ORDER — CELECOXIB 100 MG PO CAPS
100.0000 mg | ORAL_CAPSULE | Freq: Every day | ORAL | 0 refills | Status: DC
  Filled 2023-08-24: qty 90, 90d supply, fill #0

## 2023-08-25 ENCOUNTER — Other Ambulatory Visit (HOSPITAL_COMMUNITY): Payer: Self-pay

## 2023-08-26 ENCOUNTER — Encounter: Payer: Self-pay | Admitting: Internal Medicine

## 2023-08-26 LAB — B. BURGDORFI ANTIBODIES BY WB

## 2023-08-26 LAB — CYCLIC CITRUL PEPTIDE ANTIBODY, IGG: Cyclic Citrullin Peptide Ab: <16 U

## 2023-08-26 LAB — ANA: Anti Nuclear Antibody (ANA): NEGATIVE

## 2023-08-26 LAB — RHEUMATOID FACTOR: Rheumatoid fact SerPl-aCnc: <10 [IU]/mL (ref <14)

## 2023-09-07 ENCOUNTER — Telehealth: Admitting: Physician Assistant

## 2023-09-07 DIAGNOSIS — M5431 Sciatica, right side: Secondary | ICD-10-CM | POA: Diagnosis not present

## 2023-09-07 DIAGNOSIS — M5432 Sciatica, left side: Secondary | ICD-10-CM

## 2023-09-07 DIAGNOSIS — R110 Nausea: Secondary | ICD-10-CM

## 2023-09-07 DIAGNOSIS — S39012A Strain of muscle, fascia and tendon of lower back, initial encounter: Secondary | ICD-10-CM

## 2023-09-08 MED ORDER — CYCLOBENZAPRINE HCL 10 MG PO TABS: 5.0000 mg | ORAL_TABLET | Freq: Three times a day (TID) | ORAL | 0 refills | Status: DC | PRN

## 2023-09-08 MED ORDER — ONDANSETRON HCL 4 MG PO TABS: 4.0000 mg | ORAL_TABLET | Freq: Three times a day (TID) | ORAL | 0 refills | Status: DC | PRN

## 2023-09-08 NOTE — Progress Notes (Signed)
 E-Visit for Back Pain   We are sorry that you are not feeling well.  Here is how we plan to help!  Based on what you have shared with me it looks like you mostly have acute back pain.  Acute back pain is defined as musculoskeletal pain that can resolve in 1-3 weeks with conservative treatment.  I have prescribed Flexeril  10 mg every eight hours as needed which is a muscle relaxer  Some patients experience stomach irritation or in increased heartburn with anti-inflammatory drugs.  Please keep in mind that muscle relaxer's can cause fatigue and should not be taken while at work or driving.  Back pain is very common.  The pain often gets better over time.  The cause of back pain is usually not dangerous.  Most people can learn to manage their back pain on their own.  I have added Zofran  as requested.  Home Care Stay active.  Start with short walks on flat ground if you can.  Try to walk farther each day. Do not sit, drive or stand in one place for more than 30 minutes.  Do not stay in bed. Do not avoid exercise or work.  Activity can help your back heal faster. Be careful when you bend or lift an object.  Bend at your knees, keep the object close to you, and do not twist. Sleep on a firm mattress.  Lie on your side, and bend your knees.  If you lie on your back, put a pillow under your knees. Only take medicines as told by your doctor. Put ice on the injured area. Put ice in a plastic bag Place a towel between your skin and the bag Leave the ice on for 15-20 minutes, 3-4 times a day for the first 2-3 days. 210 After that, you can switch between ice and heat packs. Ask your doctor about back exercises or massage. Avoid feeling anxious or stressed.  Find good ways to deal with stress, such as exercise.  Get Help Right Way If: Your pain does not go away with rest or medicine. Your pain does not go away in 1 week. You have new problems. You do not feel well. The pain spreads into your  legs. You cannot control when you poop (bowel movement) or pee (urinate) You feel sick to your stomach (nauseous) or throw up (vomit) You have belly (abdominal) pain. You feel like you may pass out (faint). If you develop a fever.  Make Sure you: Understand these instructions. Will watch your condition Will get help right away if you are not doing well or get worse.  Your e-visit answers were reviewed by a board certified advanced clinical practitioner to complete your personal care plan.  Depending on the condition, your plan could have included both over the counter or prescription medications.  If there is a problem please reply  once you have received a response from your provider.  Your safety is important to us .  If you have drug allergies check your prescription carefully.    You can use MyChart to ask questions about today's visit, request a non-urgent call back, or ask for a work or school excuse for 24 hours related to this e-Visit. If it has been greater than 24 hours you will need to follow up with your provider, or enter a new e-Visit to address those concerns.  You will get an e-mail in the next two days asking about your experience.  I hope that your e-visit has  been valuable and will speed your recovery. Thank you for using e-visits.   I have spent 5 minutes in review of e-visit questionnaire, review and updating patient chart, medical decision making and response to patient.   Angelia Kelp, PA-C

## 2023-09-09 LAB — HM DIABETES EYE EXAM

## 2023-09-10 ENCOUNTER — Other Ambulatory Visit (HOSPITAL_COMMUNITY): Payer: Self-pay

## 2023-09-10 ENCOUNTER — Other Ambulatory Visit: Payer: Self-pay

## 2023-09-10 DIAGNOSIS — N951 Menopausal and female climacteric states: Secondary | ICD-10-CM | POA: Diagnosis not present

## 2023-09-10 DIAGNOSIS — Z975 Presence of (intrauterine) contraceptive device: Secondary | ICD-10-CM | POA: Diagnosis not present

## 2023-09-10 DIAGNOSIS — Z01419 Encounter for gynecological examination (general) (routine) without abnormal findings: Secondary | ICD-10-CM | POA: Diagnosis not present

## 2023-09-10 MED ORDER — ESTRADIOL 0.0375 MG/24HR TD PTWK
0.0375 mg | MEDICATED_PATCH | TRANSDERMAL | 4 refills | Status: AC
  Filled 2023-09-10: qty 12, 84d supply, fill #0
  Filled 2023-11-15 – 2023-11-17 (×2): qty 12, 84d supply, fill #1
  Filled 2024-02-01: qty 12, 84d supply, fill #2
  Filled 2024-02-03 – 2024-04-22 (×3): qty 12, 84d supply, fill #3

## 2023-09-17 ENCOUNTER — Other Ambulatory Visit (HOSPITAL_COMMUNITY): Payer: Self-pay

## 2023-10-08 ENCOUNTER — Other Ambulatory Visit: Payer: Self-pay | Admitting: Internal Medicine

## 2023-10-08 DIAGNOSIS — E1169 Type 2 diabetes mellitus with other specified complication: Secondary | ICD-10-CM

## 2023-10-10 ENCOUNTER — Other Ambulatory Visit: Payer: Self-pay

## 2023-10-10 MED ORDER — MOUNJARO 15 MG/0.5ML ~~LOC~~ SOAJ
15.0000 mg | SUBCUTANEOUS | 0 refills | Status: DC
Start: 2023-10-10 — End: 2024-01-02
  Filled 2023-10-10: qty 6, 84d supply, fill #0

## 2023-10-12 ENCOUNTER — Other Ambulatory Visit: Payer: Self-pay | Admitting: Primary Care

## 2023-10-13 ENCOUNTER — Other Ambulatory Visit (HOSPITAL_COMMUNITY): Payer: Self-pay

## 2023-10-13 ENCOUNTER — Other Ambulatory Visit: Payer: Self-pay

## 2023-10-13 MED ORDER — TRAZODONE HCL 150 MG PO TABS
150.0000 mg | ORAL_TABLET | Freq: Every day | ORAL | 3 refills | Status: DC
  Filled 2023-10-13: qty 30, 30d supply, fill #0
  Filled 2023-11-12: qty 30, 30d supply, fill #1
  Filled 2023-12-09: qty 30, 30d supply, fill #2
  Filled 2024-01-05: qty 30, 30d supply, fill #3

## 2023-10-13 MED ORDER — BUDESONIDE-FORMOTEROL FUMARATE 160-4.5 MCG/ACT IN AERO
2.0000 | INHALATION_SPRAY | Freq: Two times a day (BID) | RESPIRATORY_TRACT | 3 refills | Status: AC
  Filled 2023-10-13: qty 10.2, 30d supply, fill #0
  Filled 2023-11-12: qty 10.2, 30d supply, fill #1
  Filled 2023-12-12: qty 10.2, 30d supply, fill #2
  Filled 2024-02-01: qty 10.2, 30d supply, fill #3

## 2023-10-19 DIAGNOSIS — G4733 Obstructive sleep apnea (adult) (pediatric): Secondary | ICD-10-CM | POA: Diagnosis not present

## 2023-11-12 ENCOUNTER — Other Ambulatory Visit (HOSPITAL_COMMUNITY): Payer: Self-pay

## 2023-11-15 ENCOUNTER — Other Ambulatory Visit (HOSPITAL_COMMUNITY): Payer: Self-pay

## 2023-11-24 ENCOUNTER — Other Ambulatory Visit: Payer: Self-pay | Admitting: Internal Medicine

## 2023-11-24 DIAGNOSIS — M19041 Primary osteoarthritis, right hand: Secondary | ICD-10-CM

## 2023-11-25 ENCOUNTER — Other Ambulatory Visit: Payer: Self-pay

## 2023-11-25 ENCOUNTER — Other Ambulatory Visit (HOSPITAL_COMMUNITY): Payer: Self-pay

## 2023-11-25 MED ORDER — CELECOXIB 100 MG PO CAPS
100.0000 mg | ORAL_CAPSULE | Freq: Every day | ORAL | 0 refills | Status: DC
  Filled 2023-11-25: qty 90, 90d supply, fill #0

## 2023-11-28 ENCOUNTER — Telehealth: Admitting: Family Medicine

## 2023-11-28 DIAGNOSIS — L03012 Cellulitis of left finger: Secondary | ICD-10-CM

## 2023-11-28 DIAGNOSIS — R110 Nausea: Secondary | ICD-10-CM

## 2023-11-29 MED ORDER — ONDANSETRON HCL 4 MG PO TABS: 4.0000 mg | ORAL_TABLET | Freq: Three times a day (TID) | ORAL | 0 refills | Status: DC | PRN

## 2023-11-29 MED ORDER — CLINDAMYCIN HCL 300 MG PO CAPS: 300.0000 mg | ORAL_CAPSULE | Freq: Three times a day (TID) | ORAL | 0 refills | Status: AC

## 2023-11-29 NOTE — Progress Notes (Signed)
 E-Visit for Cellulitis  We are sorry that you are not feeling well. Here is how we plan to help!  Based on what you shared with me it looks like you have cellulitis.  Cellulitis looks like areas of skin redness, swelling, and warmth; it develops as a result of bacteria entering under the skin. Little red spots and/or bleeding can be seen in skin, and tiny surface sacs containing fluid can occur. Fever can be present. Cellulitis is almost always on one side of a body, and the lower limbs are the most common site of involvement.   I have prescribed:    HOME CARE:  Take your medications as ordered and take all of them, even if the skin irritation appears to be healing.   GET HELP RIGHT AWAY IF:  Symptoms that don't begin to go away within 48 hours. Severe redness persists or worsens If the area turns color, spreads or swells. If it blisters and opens, develops yellow-brown crust or bleeds. You develop a fever or chills. If the pain increases or becomes unbearable.  Are unable to keep fluids and food down.  MAKE SURE YOU   Understand these instructions. Will watch your condition. Will get help right away if you are not doing well or get worse.  Thank you for choosing an e-visit.  Your e-visit answers were reviewed by a board certified advanced clinical practitioner to complete your personal care plan. Depending upon the condition, your plan could have included both over the counter or prescription medications.  Please review your pharmacy choice. Make sure the pharmacy is open so you can pick up prescription now. If there is a problem, you may contact your provider through [employer] and have the prescription routed to another pharmacy.  Your safety is important to us . If you have drug allergies check your prescription carefully.   For the next 24 hours you can use MyChart to ask questions about today's visit, request a non-urgent call back, or ask for a work or school  excuse. You will get an email in the next two days asking about your experience. I hope that your e-visit has been valuable and will speed your recovery.    have provided 5 minutes of non face to face time during this encounter for chart review and documentation.

## 2023-12-10 ENCOUNTER — Other Ambulatory Visit: Payer: Self-pay

## 2023-12-12 ENCOUNTER — Other Ambulatory Visit: Payer: Self-pay

## 2023-12-29 ENCOUNTER — Telehealth: Admitting: Physician Assistant

## 2023-12-29 DIAGNOSIS — B9689 Other specified bacterial agents as the cause of diseases classified elsewhere: Secondary | ICD-10-CM | POA: Diagnosis not present

## 2023-12-29 DIAGNOSIS — J019 Acute sinusitis, unspecified: Secondary | ICD-10-CM | POA: Diagnosis not present

## 2023-12-30 MED ORDER — ONDANSETRON 4 MG PO TBDP: 4.0000 mg | ORAL_TABLET | Freq: Three times a day (TID) | ORAL | 0 refills | Status: DC | PRN

## 2023-12-30 MED ORDER — AMOXICILLIN-POT CLAVULANATE 875-125 MG PO TABS
1.0000 | ORAL_TABLET | Freq: Two times a day (BID) | ORAL | 0 refills | Status: DC
Start: 2023-12-30 — End: 2024-01-28

## 2023-12-30 MED ORDER — PREDNISONE 10 MG (21) PO TBPK: ORAL_TABLET | ORAL | 0 refills | Status: DC

## 2023-12-30 NOTE — Progress Notes (Signed)
 I have spent 5 minutes in review of e-visit questionnaire, review and updating patient chart, medical decision making and response to patient.   Elsie Velma Lunger, PA-C

## 2023-12-30 NOTE — Progress Notes (Signed)
 E-Visit for Sinus Problems  We are sorry that you are not feeling well.  Here is how we plan to help!  Based on what you have shared with me it looks like you have sinusitis.  Sinusitis is inflammation and infection in the sinus cavities of the head.  Based on your presentation I believe you most likely have Acute Bacterial Sinusitis.  This is an infection caused by bacteria and is treated with antibiotics. I have prescribed Augmentin  875mg /125mg  one tablet twice daily with food, for 7 days. You may use an oral decongestant such as Mucinex D or if you have glaucoma or high blood pressure use plain Mucinex. Saline nasal spray help and can safely be used as often as needed for congestion.    I have added on a prednisone  pack to help with sinus inflammation and ear pressure/dizziness since your last A1C was at a good level. I have also sent in Zofran  in case of nausea from antibiotics. Make sure to take antibiotics with food.    If you develop worsening sinus pain, fever or notice severe headache and vision changes, or if symptoms are not better after completion of antibiotic, please schedule an appointment with a health care provider.    Sinus infections are not as easily transmitted as other respiratory infection, however we still recommend that you avoid close contact with loved ones, especially the very young and elderly.  Remember to wash your hands thoroughly throughout the day as this is the number one way to prevent the spread of infection!  Home Care: Only take medications as instructed by your medical team. Complete the entire course of an antibiotic. Do not take these medications with alcohol. A steam or ultrasonic humidifier can help congestion.  You can place a towel over your head and breathe in the steam from hot water coming from a faucet. Avoid close contacts especially the very young and the elderly. Cover your mouth when you cough or sneeze. Always remember to wash your  hands.  Get Help Right Away If: You develop worsening fever or sinus pain. You develop a severe head ache or visual changes. Your symptoms persist after you have completed your treatment plan.  Make sure you Understand these instructions. Will watch your condition. Will get help right away if you are not doing well or get worse.  Thank you for choosing an e-visit.  Your e-visit answers were reviewed by a board certified advanced clinical practitioner to complete your personal care plan. Depending upon the condition, your plan could have included both over the counter or prescription medications.  Please review your pharmacy choice. Make sure the pharmacy is open so you can pick up prescription now. If there is a problem, you may contact your provider through [employer] and have the prescription routed to another pharmacy.  Your safety is important to us . If you have drug allergies check your prescription carefully.   For the next 24 hours you can use MyChart to ask questions about today's visit, request a non-urgent call back, or ask for a work or school excuse. You will get an email in the next two days asking about your experience. I hope that your e-visit has been valuable and will speed your recovery.

## 2024-01-02 ENCOUNTER — Other Ambulatory Visit: Payer: Self-pay | Admitting: Internal Medicine

## 2024-01-02 DIAGNOSIS — E1169 Type 2 diabetes mellitus with other specified complication: Secondary | ICD-10-CM

## 2024-01-05 ENCOUNTER — Other Ambulatory Visit: Payer: Self-pay

## 2024-01-05 ENCOUNTER — Other Ambulatory Visit (HOSPITAL_COMMUNITY): Payer: Self-pay

## 2024-01-05 MED ORDER — MOUNJARO 15 MG/0.5ML ~~LOC~~ SOAJ
15.0000 mg | SUBCUTANEOUS | 0 refills | Status: DC
  Filled 2024-01-05: qty 6, 84d supply, fill #0

## 2024-01-19 DIAGNOSIS — G4733 Obstructive sleep apnea (adult) (pediatric): Secondary | ICD-10-CM | POA: Diagnosis not present

## 2024-01-28 ENCOUNTER — Telehealth: Admitting: Physician Assistant

## 2024-01-28 DIAGNOSIS — J019 Acute sinusitis, unspecified: Secondary | ICD-10-CM

## 2024-01-28 DIAGNOSIS — B9689 Other specified bacterial agents as the cause of diseases classified elsewhere: Secondary | ICD-10-CM

## 2024-01-28 DIAGNOSIS — R110 Nausea: Secondary | ICD-10-CM

## 2024-01-28 MED ORDER — ONDANSETRON 4 MG PO TBDP: 4.0000 mg | ORAL_TABLET | Freq: Three times a day (TID) | ORAL | 0 refills | Status: DC | PRN

## 2024-01-28 MED ORDER — AMOXICILLIN-POT CLAVULANATE 875-125 MG PO TABS: 1.0000 | ORAL_TABLET | Freq: Two times a day (BID) | ORAL | 0 refills | Status: DC

## 2024-01-28 MED ORDER — FLUTICASONE PROPIONATE 50 MCG/ACT NA SUSP: 2.0000 | Freq: Every day | NASAL | 0 refills | Status: AC

## 2024-01-28 NOTE — Progress Notes (Signed)
 E-Visit for Sinus Problems  We are sorry that you are not feeling well.  Here is how we plan to help!  Based on what you have shared with me it looks like you have sinusitis.  Sinusitis is inflammation and infection in the sinus cavities of the head.  Based on your presentation I believe you most likely have Acute Bacterial Sinusitis.  This is an infection caused by bacteria and is treated with antibiotics. I have prescribed Augmentin  875mg /125mg  one tablet twice daily with food, for 7 days. I have also sent in Flonase  nasal spray Use 2 sprays in each nostril daily. I have refilled Zofran .  You may use an oral decongestant such as Mucinex D or if you have glaucoma or high blood pressure use plain Mucinex. Saline nasal spray help and can safely be used as often as needed for congestion.  If you develop worsening sinus pain, fever or notice severe headache and vision changes, or if symptoms are not better after completion of antibiotic, please schedule an appointment with a health care provider.    Sinus infections are not as easily transmitted as other respiratory infection, however we still recommend that you avoid close contact with loved ones, especially the very young and elderly.  Remember to wash your hands thoroughly throughout the day as this is the number one way to prevent the spread of infection!  Home Care: Only take medications as instructed by your medical team. Complete the entire course of an antibiotic. Do not take these medications with alcohol. A steam or ultrasonic humidifier can help congestion.  You can place a towel over your head and breathe in the steam from hot water coming from a faucet. Avoid close contacts especially the very young and the elderly. Cover your mouth when you cough or sneeze. Always remember to wash your hands.  Get Help Right Away If: You develop worsening fever or sinus pain. You develop a severe head ache or visual changes. Your symptoms persist after  you have completed your treatment plan.  Make sure you Understand these instructions. Will watch your condition. Will get help right away if you are not doing well or get worse.  Thank you for choosing an e-visit.  Your e-visit answers were reviewed by a board certified advanced clinical practitioner to complete your personal care plan. Depending upon the condition, your plan could have included both over the counter or prescription medications.  Please review your pharmacy choice. Make sure the pharmacy is open so you can pick up prescription now. If there is a problem, you may contact your provider through [employer] and have the prescription routed to another pharmacy.  Your safety is important to us . If you have drug allergies check your prescription carefully.   For the next 24 hours you can use MyChart to ask questions about today's visit, request a non-urgent call back, or ask for a work or school excuse. You will get an email in the next two days asking about your experience. I hope that your e-visit has been valuable and will speed your recovery.    I have spent 5 minutes in review of e-visit questionnaire, review and updating patient chart, medical decision making and response to patient.   Delon CHRISTELLA Dickinson, PA-C

## 2024-01-29 ENCOUNTER — Other Ambulatory Visit: Payer: Self-pay | Admitting: Primary Care

## 2024-01-30 ENCOUNTER — Other Ambulatory Visit (HOSPITAL_COMMUNITY): Payer: Self-pay

## 2024-01-30 MED ORDER — TRAZODONE HCL 150 MG PO TABS
150.0000 mg | ORAL_TABLET | Freq: Every day | ORAL | 3 refills | Status: DC
  Filled 2024-01-30: qty 30, 30d supply, fill #0
  Filled 2024-02-26: qty 30, 30d supply, fill #1
  Filled 2024-03-24: qty 30, 30d supply, fill #2
  Filled 2024-04-18 – 2024-04-22 (×2): qty 30, 30d supply, fill #3

## 2024-01-30 NOTE — Telephone Encounter (Signed)
 Fine to refill

## 2024-01-30 NOTE — Telephone Encounter (Signed)
 Pt requesting refill of Trazodone . LOV 06/23/23, last fill 10/12/23. Please advise, thank you!

## 2024-02-02 ENCOUNTER — Other Ambulatory Visit: Payer: Self-pay

## 2024-02-03 ENCOUNTER — Other Ambulatory Visit (HOSPITAL_BASED_OUTPATIENT_CLINIC_OR_DEPARTMENT_OTHER): Payer: Self-pay

## 2024-02-03 ENCOUNTER — Other Ambulatory Visit: Payer: Self-pay

## 2024-02-03 ENCOUNTER — Ambulatory Visit (HOSPITAL_BASED_OUTPATIENT_CLINIC_OR_DEPARTMENT_OTHER)
Admission: RE | Admit: 2024-02-03 | Discharge: 2024-02-03 | Disposition: A | Discharge status: Home / Self Care | Attending: Family Medicine | Admitting: Family Medicine

## 2024-02-03 ENCOUNTER — Encounter (HOSPITAL_BASED_OUTPATIENT_CLINIC_OR_DEPARTMENT_OTHER): Payer: Self-pay

## 2024-02-03 VITALS — BP 144/91 | HR 95 | Temp 98.0°F | Resp 20

## 2024-02-03 DIAGNOSIS — J0111 Acute recurrent frontal sinusitis: Secondary | ICD-10-CM | POA: Diagnosis not present

## 2024-02-03 DIAGNOSIS — R110 Nausea: Secondary | ICD-10-CM

## 2024-02-03 DIAGNOSIS — M546 Pain in thoracic spine: Secondary | ICD-10-CM

## 2024-02-03 DIAGNOSIS — H6993 Unspecified Eustachian tube disorder, bilateral: Secondary | ICD-10-CM | POA: Diagnosis not present

## 2024-02-03 MED ORDER — PREDNISONE 20 MG PO TABS
40.0000 mg | ORAL_TABLET | Freq: Every day | ORAL | 0 refills | Status: AC
  Filled 2024-02-03: qty 10, 5d supply, fill #0

## 2024-02-03 MED ORDER — ONDANSETRON 4 MG PO TBDP
4.0000 mg | ORAL_TABLET | Freq: Three times a day (TID) | ORAL | 0 refills | Status: DC | PRN
  Filled 2024-02-03: qty 20, 7d supply, fill #0

## 2024-02-03 MED ORDER — CEFDINIR 300 MG PO CAPS
300.0000 mg | ORAL_CAPSULE | Freq: Two times a day (BID) | ORAL | 0 refills | Status: AC
  Filled 2024-02-03: qty 14, 7d supply, fill #0

## 2024-02-03 MED ORDER — METHOCARBAMOL 500 MG PO TABS
500.0000 mg | ORAL_TABLET | Freq: Two times a day (BID) | ORAL | 0 refills | Status: DC
  Filled 2024-02-03: qty 20, 10d supply, fill #0

## 2024-02-03 NOTE — Discharge Instructions (Signed)
 Take the medication as prescribed You can take over-the-counter medications for symptoms as needed.  Follow-up as needed

## 2024-02-03 NOTE — ED Triage Notes (Signed)
 Being treated for sinus infection with Augmentin . Sinus congestion, bilateral ear pain. States it was an e-visit so ears were not looked at. Feels as if sinus congestion, ear pain worsening. Taking Coricidin, nasal allergy sprays. Covid test yesterday negative. Patient has hx of asthma as well.

## 2024-02-03 NOTE — ED Provider Notes (Signed)
 [facility]    CSN: [id number] Arrival date & time: 02/03/24  9147      History   Chief Complaint Chief Complaint  Patient presents with   Ear Fullness    Being treated for sinus infection. It is now worse. - Entered by patient    HPI Megan Duran is a 43 y.o. female.   Patient is a 43 year old female who presents today with bilateral ear fullness, upper back pain, worsening congestion.  Being treated for sinus infection with Augmentin . Sinus congestion, bilateral ear pain. States it was an e-visit so ears were not looked at. Feels as if sinus congestion, ear pain worsening. Taking Coricidin, nasal allergy sprays. Covid test yesterday negative. Patient has hx of asthma as well.    Ear Fullness    Past Medical History:  Diagnosis Date   Asthma    only uses maintanence inhaler   Gastroparesis    GERD (gastroesophageal reflux disease)    takes meds   History of MRSA infection    IBS (irritable bowel syndrome)    Migraine    PONV (postoperative nausea and vomiting)     Patient Active Problem List   Diagnosis Date Noted   Arthritis of both hands 02/20/2023   Diabetes mellitus (HCC) 08/23/2022   Hypertension associated with diabetes (HCC) 08/23/2022   Severe sleep apnea 02/28/2022   Insomnia 02/28/2022   Need for vaccination 11/26/2021   PHN (postherpetic neuralgia) 11/13/2021   Nasal turbinate hypertrophy 04/17/2021   Primary hypertension 12/05/2020   Morbid obesity with BMI of 40.0-44.9, adult (HCC) 12/05/2020   Encounter for general adult medical examination with abnormal findings 12/05/2020   LVH (left ventricular hypertrophy) due to hypertensive disease, without heart failure 12/05/2020   Visit for screening mammogram 12/05/2020   Iron deficiency 12/22/2015   Vitamin D  deficiency 12/22/2015   Gastroparesis 12/20/2015   IBS (irritable bowel syndrome) 12/20/2015   Osteoarthritis involving multiple joints on both sides of body 12/20/2015   GERD  (gastroesophageal reflux disease) 12/20/2015    Past Surgical History:  Procedure Laterality Date   GALLBLADDER SURGERY  2007   NASAL SEPTOPLASTY W/ TURBINOPLASTY Bilateral 07/11/2021   Procedure: NASAL SEPTOPLASTY WITH TURBINATE REDUCTION;  Surgeon: Mable Lenis, MD;  Location: [facility];  Service: ENT;  Laterality: Bilateral;    OB History     Gravida  2   Para  2   Term  2   Preterm      AB      Living  2      SAB      IAB      Ectopic      Multiple  0   Live Births  1            Home Medications    Prior to Admission medications   Medication Sig Start Date End Date Taking? Authorizing Provider  cefdinir  (OMNICEF ) 300 MG capsule Take 1 capsule (300 mg total) by mouth 2 (two) times daily for 7 days. 02/03/24 02/10/24 Yes Cristella Stiver A, FNP  methocarbamol  (ROBAXIN ) 500 MG tablet Take 1 tablet (500 mg total) by mouth 2 (two) times daily. 02/03/24  Yes Shenique Childers A, FNP  predniSONE  (DELTASONE ) 20 MG tablet Take 2 tablets (40 mg total) by mouth daily with breakfast for 5 days. 02/03/24 02/08/24 Yes Laryn Venning A, FNP  albuterol  (VENTOLIN  HFA) 108 (90 Base) MCG/ACT inhaler Inhale 2 puffs into the lungs every 4 (four) hours as needed for  wheezing or shortness of breath. 06/13/23   Banister, Pamela K, MD  budesonide -formoterol  (SYMBICORT ) 160-4.5 MCG/ACT inhaler Inhale 2 puffs into the lungs 2 (two) times daily. 10/13/23   Hope Almarie ORN, NP  celecoxib  (CELEBREX ) 100 MG capsule Take 1 capsule (100 mg total) by mouth daily. 11/25/23   Joshua Debby CROME, MD  Cholecalciferol (VITAMIN D3) POWD Take by mouth. 04/17/21   [provider]  estradiol  (CLIMARA  - DOSED IN MG/24 HR) 0.0375 mg/24hr patch Place 1 patch (0.0375 mg total) onto the skin once a week. 09/10/23     fluticasone  (FLONASE ) 50 MCG/ACT nasal spray Place 2 sprays into both nostrils daily. 01/28/24   Burnette, Venise M, PA-C  levonorgestrel (MIRENA, 52 MG,) 20 MCG/24HR IUD Mirena 20 mcg/24  hours (6 yrs) 52 mg intrauterine device  Take 1 device by intrauterine route.    [provider]  Multiple Vitamin (MULTIVITAMIN PO) Take by mouth.    [provider]  nebivolol  (BYSTOLIC ) 10 MG tablet Take 1 tablet (10 mg total) by mouth daily. 08/21/23   Joshua Debby CROME, MD  omeprazole  (PRILOSEC) 40 MG capsule Take 1 capsule (40 mg total) by mouth 2 (two) times daily. 06/20/22   Kozlow, Camellia PARAS, MD  ondansetron  (ZOFRAN -ODT) 4 MG disintegrating tablet Take 1 tablet (4 mg total) by mouth every 8 (eight) hours as needed. 02/03/24   Adah Corning A, FNP  tirzepatide  (MOUNJARO ) 15 MG/0.5ML Pen Inject 15 mg into the skin once a week. 01/05/24   Joshua Debby CROME, MD  traZODone  (DESYREL ) 150 MG tablet Take 1 tablet (150 mg total) by mouth at bedtime. 01/30/24   Hope Almarie ORN, NP  VITAMIN D  PO Take by mouth daily.    [provider]    Family History Family History  Problem Relation Age of Onset   Anxiety disorder Mother    Depression Mother    Heart Problems Mother    Hypertension Mother    Diabetes Mother    AVM Mother    Stroke Mother    Atrial fibrillation Mother    Heart attack Mother    Fibromyalgia Mother    Breast cancer Mother 57       recur @ 40   Congestive Heart Failure Mother    Hypertension Father    Diabetes Father    Obesity Father    Heart attack Father    High Cholesterol Father    Depression Sister    Pancreatitis Sister    Diabetes Sister    Hypertension Sister    GER disease Sister    High Cholesterol Sister    Migraines Sister    Pancreatic cancer Maternal Aunt    Pancreatic cancer Maternal Uncle    Dementia Maternal Grandmother    Hypertension Maternal Grandmother    Hyperthyroidism Maternal Grandmother    Throat cancer Maternal Grandfather    Heart attack Paternal Grandmother     Social History Social History   Tobacco Use   Smoking status: Never    Passive exposure: Past   Smokeless tobacco: Never  Vaping Use   Vaping  status: Never Used  Substance Use Topics   Alcohol use: No    Alcohol/week: 0.0 standard drinks of alcohol   Drug use: Never     Allergies   Bactrim [sulfamethoxazole-trimethoprim], Biaxin [clarithromycin], Doxycycline, Effexor [venlafaxine], Erythromycin, Imitrex [sumatriptan], Keflex [cephalexin], and Minocycline   Review of Systems Review of Systems  See HPI Physical Exam Triage Vital Signs ED Triage Vitals [02/03/24 0905]  Encounter Vitals Group     BP (!) 144/91     Girls Systolic BP Percentile      Girls Diastolic BP Percentile      Boys Systolic BP Percentile      Boys Diastolic BP Percentile      Pulse Rate 95     Resp 20     Temp 98 F (36.7 C)     Temp Source Oral     SpO2 97 %     Weight      Height      Head Circumference      Peak Flow      Pain Score      Pain Loc      Pain Education      Exclude from Growth Chart    No data found.  Updated Vital Signs BP (!) 144/91 (BP Location: Right Arm)   Pulse 95   Temp 98 F (36.7 C) (Oral)   Resp 20   SpO2 97%   Visual Acuity Right Eye Distance:   Left Eye Distance:   Bilateral Distance:    Right Eye Near:   Left Eye Near:    Bilateral Near:     Physical Exam Constitutional:      General: She is not in acute distress.    Appearance: Normal appearance. She is not ill-appearing, toxic-appearing or diaphoretic.  HENT:     Head: Normocephalic and atraumatic.     Right Ear: Tympanic membrane and ear canal normal.     Left Ear: Tympanic membrane and ear canal normal.     Nose: Congestion and rhinorrhea present.     Mouth/Throat:     Pharynx: Oropharynx is clear.  Eyes:     Conjunctiva/sclera: Conjunctivae normal.  Cardiovascular:     Rate and Rhythm: Normal rate and regular rhythm.     Pulses: Normal pulses.     Heart sounds: Normal heart sounds.  Pulmonary:     Effort: Pulmonary effort is normal.     Breath sounds: Normal breath sounds.  Skin:    General: Skin is warm and dry.   Neurological:     Mental Status: She is alert.  Psychiatric:        Mood and Affect: Mood normal.      UC Treatments / Results  Labs (all labs ordered are listed, but only abnormal results are displayed) Labs Reviewed - No data to display  EKG   Radiology No results found.  Procedures Procedures (including critical care time)  Medications Ordered in UC Medications - No data to display  Initial Impression / Assessment and Plan / UC Course  I have reviewed the triage vital signs and the nursing notes.  Pertinent labs & imaging results that were available during my care of the patient were reviewed by me and considered in my medical decision making (see chart for details).     Sinusitis, eustachian tube dysfunction and acute upper back pain-patient currently on Augmentin  but feels like her symptoms are worsening.  Will switch over to cefdinir  at this time for bacterial sinusitis.  Also prescribing prednisone  to take daily for the next 5 days. Patient requesting Zofran  for nausea and muscle relaxer to help her back pain.  These were sent to the pharmacy.  She can continue over-the-counter medications for symptoms as needed.  Follow-up as needed Final Clinical Impressions(s) / UC Diagnoses   Final diagnoses:  Acute recurrent frontal sinusitis  ETD (Eustachian tube dysfunction), bilateral  Acute thoracic back pain, unspecified back pain laterality     Discharge Instructions      Take the medication as prescribed You can take over-the-counter medications for symptoms as needed.  Follow-up as needed    ED Prescriptions     Medication Sig Dispense Auth. Provider   ondansetron  (ZOFRAN -ODT) 4 MG disintegrating tablet Take 1 tablet (4 mg total) by mouth every 8 (eight) hours as needed. 20 tablet Kanna Dafoe A, FNP   methocarbamol  (ROBAXIN ) 500 MG tablet Take 1 tablet (500 mg total) by mouth 2 (two) times daily. 20 tablet Laketta Soderberg A, FNP   cefdinir  (OMNICEF ) 300 MG  capsule Take 1 capsule (300 mg total) by mouth 2 (two) times daily for 7 days. 14 capsule Endrit Gittins A, FNP   predniSONE  (DELTASONE ) 20 MG tablet Take 2 tablets (40 mg total) by mouth daily with breakfast for 5 days. 10 tablet Adah Wilbert LABOR, FNP      PDMP not reviewed this encounter.   Adah Wilbert LABOR, FNP 02/03/24 [phone]

## 2024-02-15 ENCOUNTER — Other Ambulatory Visit: Payer: Self-pay | Admitting: Internal Medicine

## 2024-02-15 DIAGNOSIS — R000 Tachycardia, unspecified: Secondary | ICD-10-CM

## 2024-02-16 ENCOUNTER — Other Ambulatory Visit: Payer: Self-pay | Admitting: Internal Medicine

## 2024-02-16 DIAGNOSIS — M19041 Primary osteoarthritis, right hand: Secondary | ICD-10-CM

## 2024-02-17 ENCOUNTER — Other Ambulatory Visit: Payer: Self-pay

## 2024-02-17 ENCOUNTER — Other Ambulatory Visit (HOSPITAL_COMMUNITY): Payer: Self-pay

## 2024-02-17 MED ORDER — NEBIVOLOL HCL 10 MG PO TABS
10.0000 mg | ORAL_TABLET | Freq: Every day | ORAL | 1 refills | Status: AC
  Filled 2024-02-17: qty 90, 90d supply, fill #0
  Filled 2024-05-13: qty 90, 90d supply, fill #1

## 2024-02-17 MED ORDER — CELECOXIB 100 MG PO CAPS
100.0000 mg | ORAL_CAPSULE | Freq: Every day | ORAL | 0 refills | Status: AC
  Filled 2024-02-17: qty 90, 90d supply, fill #0

## 2024-02-18 ENCOUNTER — Other Ambulatory Visit: Payer: Self-pay

## 2024-02-18 ENCOUNTER — Other Ambulatory Visit (HOSPITAL_COMMUNITY): Payer: Self-pay

## 2024-02-23 ENCOUNTER — Ambulatory Visit: Admitting: Internal Medicine

## 2024-02-27 ENCOUNTER — Other Ambulatory Visit (HOSPITAL_COMMUNITY): Payer: Self-pay

## 2024-02-29 ENCOUNTER — Telehealth: Admitting: Family

## 2024-02-29 DIAGNOSIS — M546 Pain in thoracic spine: Secondary | ICD-10-CM | POA: Diagnosis not present

## 2024-02-29 MED ORDER — BACLOFEN 10 MG PO TABS: 10.0000 mg | ORAL_TABLET | Freq: Three times a day (TID) | ORAL | 0 refills | Status: DC

## 2024-02-29 MED ORDER — NAPROXEN 500 MG PO TABS
500.0000 mg | ORAL_TABLET | Freq: Two times a day (BID) | ORAL | 0 refills | Status: AC
Start: 2024-02-29 — End: ?

## 2024-02-29 NOTE — Progress Notes (Signed)

## 2024-03-04 ENCOUNTER — Ambulatory Visit: Admitting: Internal Medicine

## 2024-03-11 ENCOUNTER — Ambulatory Visit: Admitting: Internal Medicine

## 2024-03-11 ENCOUNTER — Encounter: Payer: Self-pay | Admitting: Internal Medicine

## 2024-03-11 ENCOUNTER — Other Ambulatory Visit (HOSPITAL_COMMUNITY): Payer: Self-pay

## 2024-03-11 ENCOUNTER — Ambulatory Visit: Payer: Self-pay | Admitting: Internal Medicine

## 2024-03-11 ENCOUNTER — Ambulatory Visit (INDEPENDENT_AMBULATORY_CARE_PROVIDER_SITE_OTHER)

## 2024-03-11 VITALS — BP 132/72 | HR 90 | Temp 98.3°F | Resp 16 | Ht 65.0 in | Wt 323.4 lb

## 2024-03-11 DIAGNOSIS — I152 Hypertension secondary to endocrine disorders: Secondary | ICD-10-CM

## 2024-03-11 DIAGNOSIS — M19042 Primary osteoarthritis, left hand: Secondary | ICD-10-CM

## 2024-03-11 DIAGNOSIS — K219 Gastro-esophageal reflux disease without esophagitis: Secondary | ICD-10-CM | POA: Diagnosis not present

## 2024-03-11 DIAGNOSIS — Z0000 Encounter for general adult medical examination without abnormal findings: Secondary | ICD-10-CM

## 2024-03-11 DIAGNOSIS — M1380 Other specified arthritis, unspecified site: Secondary | ICD-10-CM

## 2024-03-11 DIAGNOSIS — M19041 Primary osteoarthritis, right hand: Secondary | ICD-10-CM

## 2024-03-11 DIAGNOSIS — Z13220 Encounter for screening for lipoid disorders: Secondary | ICD-10-CM | POA: Diagnosis not present

## 2024-03-11 DIAGNOSIS — M79641 Pain in right hand: Secondary | ICD-10-CM | POA: Diagnosis not present

## 2024-03-11 DIAGNOSIS — E1169 Type 2 diabetes mellitus with other specified complication: Secondary | ICD-10-CM

## 2024-03-11 DIAGNOSIS — E1159 Type 2 diabetes mellitus with other circulatory complications: Secondary | ICD-10-CM | POA: Diagnosis not present

## 2024-03-11 DIAGNOSIS — Z0001 Encounter for general adult medical examination with abnormal findings: Secondary | ICD-10-CM

## 2024-03-11 DIAGNOSIS — M79642 Pain in left hand: Secondary | ICD-10-CM | POA: Diagnosis not present

## 2024-03-11 LAB — BASIC METABOLIC PANEL WITH GFR
BUN: 14 mg/dL (ref 6–23)
CO2: 23 meq/L (ref 19–32)
Calcium: 9.1 mg/dL (ref 8.4–10.5)
Chloride: 104 meq/L (ref 96–112)
Creatinine, Ser: 0.94 mg/dL (ref 0.40–1.20)
GFR: 73.72 mL/min (ref >60.00)
Glucose, Bld: 126 mg/dL — ABNORMAL HIGH (ref 70–99)
Potassium: 4 meq/L (ref 3.5–5.1)
Sodium: 137 meq/L (ref 135–145)

## 2024-03-11 LAB — HEPATIC FUNCTION PANEL
ALT: 32 U/L (ref 0–35)
AST: 22 U/L (ref 0–37)
Albumin: 4.2 g/dL (ref 3.5–5.2)
Alkaline Phosphatase: 81 U/L (ref 39–117)
Bilirubin, Direct: 0.1 mg/dL (ref 0.0–0.3)
Total Bilirubin: 0.3 mg/dL (ref 0.2–1.2)
Total Protein: 7.5 g/dL (ref 6.0–8.3)

## 2024-03-11 LAB — CBC WITH DIFFERENTIAL/PLATELET
Basophils Absolute: 0 K/uL (ref 0.0–0.1)
Basophils Relative: 0.4 % (ref 0.0–3.0)
Eosinophils Absolute: 0 K/uL (ref 0.0–0.7)
Eosinophils Relative: 0.2 % (ref 0.0–5.0)
HCT: 39 % (ref 36.0–46.0)
Hemoglobin: 12.7 g/dL (ref 12.0–15.0)
Lymphocytes Relative: 9.9 % — ABNORMAL LOW (ref 12.0–46.0)
Lymphs Abs: 1.2 K/uL (ref 0.7–4.0)
MCHC: 32.6 g/dL (ref 30.0–36.0)
MCV: 92.9 fl (ref 78.0–100.0)
Monocytes Absolute: 0.7 K/uL (ref 0.1–1.0)
Monocytes Relative: 5.6 % (ref 3.0–12.0)
Neutro Abs: 9.9 K/uL — ABNORMAL HIGH (ref 1.4–7.7)
Neutrophils Relative %: 83.9 % — ABNORMAL HIGH (ref 43.0–77.0)
Platelets: 310 K/uL (ref 150.0–400.0)
RBC: 4.2 Mil/uL (ref 3.87–5.11)
RDW: 15.8 % — ABNORMAL HIGH (ref 11.5–15.5)
WBC: 11.8 K/uL — ABNORMAL HIGH (ref 4.0–10.5)

## 2024-03-11 LAB — LIPID PANEL
Cholesterol: 243 mg/dL — ABNORMAL HIGH (ref 0–200)
HDL: 40.4 mg/dL (ref >39.00)
LDL Cholesterol: 150 mg/dL — ABNORMAL HIGH (ref 0–99)
NonHDL: 202.75
Total CHOL/HDL Ratio: 6
Triglycerides: 263 mg/dL — ABNORMAL HIGH (ref 0.0–149.0)
VLDL: 52.6 mg/dL — ABNORMAL HIGH (ref 0.0–40.0)

## 2024-03-11 LAB — C-REACTIVE PROTEIN: CRP: <0.5 mg/dL (ref 0.5–20.0)

## 2024-03-11 MED ORDER — COVID-19 MRNA VAC-TRIS(PFIZER) 30 MCG/0.3ML IM SUSY
0.3000 mL | PREFILLED_SYRINGE | Freq: Once | INTRAMUSCULAR | 0 refills | Status: AC
  Filled 2024-03-11: qty 0.3, 1d supply, fill #0

## 2024-03-11 NOTE — Patient Instructions (Signed)

## 2024-03-11 NOTE — Progress Notes (Unsigned)
 Subjective:  Patient ID: Megan Duran, female    DOB: Mar 25, 1981  Age: 43 y.o. MRN: [id number]  CC: Medical Management of Chronic Issues (6 month follow up /Finger pain, tightness. )   HPI Megan Duran presents for ***  Discussed the use of AI scribe software for clinical note transcription with the patient, who gave verbal consent to proceed.  History of Present Illness Megan Duran is a 43 year old female who presents with persistent hand pain and swelling.  She experiences persistent hand pain and swelling, particularly affecting the pinkies, with increased discomfort during activities such as driving or extended hand use. Stiffness and occasional spasms in the right hand disrupt her sleep. Her hands sometimes become red and swollen, with more pronounced nodules on the joints. Despite using Celebrex , the pain persists.  She also experiences wrist pain, particularly at night, which she manages with Voltaren  gel. She has been tested for lupus and rheumatoid arthritis in the past, with no conclusive results. She recalls a borderline Lyme disease diagnosis, which was treated, and subsequent tests were negative. Her grandmother had severe rheumatoid arthritis, which significantly affected her hands.  She reports episodes of heart racing during routine activities, accompanied by shortness of breath, dizziness, and occasional headaches. These symptoms can appear suddenly and are sometimes mistaken for sinus issues.  She is not very active during the week due to time constraints but tries to be more active with her children on weekends. Her sleep quality varies, with some nights being better than others. She is currently taking Mounjaro  without any side effects such as nausea, abdominal pain, or bowel issues.     Outpatient Medications Prior to Visit  Medication Sig Dispense Refill   albuterol  (VENTOLIN  HFA) 108 (90 Base) MCG/ACT inhaler Inhale 2 puffs into the lungs every 4  (four) hours as needed for wheezing or shortness of breath. 6.7 g 0   baclofen (LIORESAL) 10 MG tablet Take 1 tablet (10 mg total) by mouth 3 (three) times daily. 30 each 0   budesonide -formoterol  (SYMBICORT ) 160-4.5 MCG/ACT inhaler Inhale 2 puffs into the lungs 2 (two) times daily. 10.2 g 3   celecoxib  (CELEBREX ) 100 MG capsule Take 1 capsule (100 mg total) by mouth daily. 90 capsule 0   Cholecalciferol (VITAMIN D3) POWD Take by mouth.     estradiol  (CLIMARA  - DOSED IN MG/24 HR) 0.0375 mg/24hr patch Place 1 patch (0.0375 mg total) onto the skin once a week. 12 patch 4   fluticasone  (FLONASE ) 50 MCG/ACT nasal spray Place 2 sprays into both nostrils daily. 16 g 0   levonorgestrel (MIRENA, 52 MG,) 20 MCG/24HR IUD Mirena 20 mcg/24 hours (6 yrs) 52 mg intrauterine device  Take 1 device by intrauterine route.     Multiple Vitamin (MULTIVITAMIN PO) Take by mouth.     naproxen (NAPROSYN) 500 MG tablet Take 1 tablet (500 mg total) by mouth 2 (two) times daily with a meal. 30 tablet 0   nebivolol  (BYSTOLIC ) 10 MG tablet Take 1 tablet (10 mg total) by mouth daily. 90 tablet 1   omeprazole  (PRILOSEC) 40 MG capsule Take 1 capsule (40 mg total) by mouth 2 (two) times daily. 60 capsule 2   ondansetron  (ZOFRAN -ODT) 4 MG disintegrating tablet Take 1 tablet (4 mg total) by mouth every 8 (eight) hours as needed. 20 tablet 0   tirzepatide  (MOUNJARO ) 15 MG/0.5ML Pen Inject 15 mg into the skin once a week. 6 mL 0   traZODone  (DESYREL ) 150 MG  tablet Take 1 tablet (150 mg total) by mouth at bedtime. 30 tablet 3   VITAMIN D  PO Take by mouth daily.     No facility-administered medications prior to visit.    ROS Review of Systems  Objective:  BP 132/72 (BP Location: Left Arm, Patient Position: Sitting, Cuff Size: Large)   Pulse 90   Temp 98.3 F (36.8 C) (Oral)   Resp 16   Ht 5' 5 (1.651 m)   Wt (!) 323 lb 6.4 oz (146.7 kg)   SpO2 96%   BMI 53.82 kg/m   BP Readings from Last 3 Encounters:  03/11/24  132/72  02/03/24 (!) 144/91  08/21/23 118/78    Wt Readings from Last 3 Encounters:  03/11/24 (!) 323 lb 6.4 oz (146.7 kg)  08/21/23 (!) 317 lb 12.8 oz (144.2 kg)  06/23/23 (!) 314 lb 3.2 oz (142.5 kg)    Physical Exam  Lab Results  Component Value Date   WBC 12.9 (H) 08/21/2023   HGB 12.5 08/21/2023   HCT 39.3 08/21/2023   PLT 300.0 08/21/2023   GLUCOSE 99 08/21/2023   CHOL 288 (A) 11/19/2021   TRIG 118 11/19/2021   HDL 51 11/19/2021   LDLCALC 216 11/19/2021   ALT 28 08/21/2023   AST 18 08/21/2023   NA 135 08/21/2023   K 4.4 08/21/2023   CL 101 08/21/2023   CREATININE 0.99 08/21/2023   BUN 16 08/21/2023   CO2 26 08/21/2023   TSH 0.71 08/21/2023   HGBA1C 6.0 08/21/2023   MICROALBUR 1.3 08/21/2023    DG Hand Complete Right Result Date: 03/11/2024 EXAM: 3 OR MORE VIEW(S) XRAY OF THE RIGHT HAND 03/11/2024 03:17:28 PM COMPARISON: None available. CLINICAL HISTORY: pain pain FINDINGS: BONES AND JOINTS: No acute fracture. No focal osseous lesion. No joint dislocation. Mild joint space narrowing involving the 2nd through 5th DIP joints, moderate at the 5th DIP joint. Minimal joint space narrowing at the 3rd, 4th, and 5th PIP joints. No frank erosions. SOFT TISSUES: The soft tissues are unremarkable. IMPRESSION: 1. Arthritis involving the dip and pip joints as above, worse at the fifth dip joint. Electronically signed by: Luke Bun MD 03/11/2024 03:37 PM EDT RP Workstation: [id number]   DG Hand Complete Left Result Date: 03/11/2024 EXAM: 3 OR MORE VIEW(S) XRAY OF THE LEFT HAND 03/11/2024 03:17:28 PM COMPARISON: None available. CLINICAL HISTORY: pain pain FINDINGS: BONES AND JOINTS: No acute fracture. No focal osseous lesion. No joint dislocation. Mild joint space narrowing involving the fourth and fifth PIP joints. Diffuse joint space narrowing involving the second through fourth DIP joints, worst at the fifth DIP joint. SOFT TISSUES: The soft tissues are unremarkable.  IMPRESSION: 1. Arthritis involving the dip and pip joints as above, worst at the fifth dip joint. Electronically signed by: Luke Bun MD 03/11/2024 03:35 PM EDT RP Workstation: [id number]     Assessment & Plan:  Type 2 diabetes mellitus with other specified complication, without long-term current use of insulin (HCC) -     Basic metabolic panel with GFR; Future -     Hemoglobin A1c; Future -     Hepatitis B surface antibody,quantitative; Future -     Hepatic function panel; Future -     COVID-19 mRNA Vac-TriS(Pf[employer]); Inject 0.3 mLs into the muscle once for 1 dose.  Dispense: 0.3 mL; Refill: 0  Hypertension associated with diabetes (HCC) -     CBC with Differential/Platelet; Future -     Hepatic function panel; Future  Gastroesophageal reflux  disease without esophagitis -     CBC with Differential/Platelet; Future  Encounter for general adult medical examination with abnormal findings -     Lipid panel; Future  Inflammatory arthritis -     Ambulatory referral to Rheumatology -     DG Hand Complete Left; Future -     DG Hand Complete Right; Future -     C-reactive protein; Future  Arthritis of both hands -     DG Hand Complete Left; Future -     DG Hand Complete Right; Future     Follow-up: Return in about 6 months (around 09/09/2024).  Debby Molt, MD

## 2024-03-12 LAB — HEMOGLOBIN A1C: Hgb A1c MFr Bld: 6.2 % (ref 4.6–6.5)

## 2024-03-12 LAB — HEPATITIS B SURFACE ANTIBODY, QUANTITATIVE: Hep B S AB Quant (Post): >1000 m[IU]/mL (ref >=10)

## 2024-03-23 NOTE — Progress Notes (Addendum)
 Office Visit Note  Patient: Megan Duran             Date of Birth: [birth year]           MRN: [id number]             PCP: Joshua Debby LITTIE, MD Referring: Joshua Debby LITTIE, MD Visit Date: 03/24/2024 Occupation: @GUAROCC @  Subjective:  New Patient (Initial Visit) (Joint Pain )  Discussed the use of AI scribe software for clinical note transcription with the patient, who gave verbal consent to proceed.  History of Present Illness Megan Duran is a 43 year old female who presents with worsening joint pain.  She has experienced joint pain for several years, which has worsened over the past year, particularly affecting her fingers, ankles, and hips. The pain is bilateral and impacts daily activities, including work-related tasks that require hand use. Her pinkies have become more curved, and she has difficulty with fine motor tasks such as buttoning clothes and assembling Legos with her child.  The joint pain is worse in the morning, with stiffness lasting 30 to 45 minutes, especially in her knees and ankles. The pain improves somewhat during the day but worsens again at night. Her hands become more painful with use, such as typing or assembling equipment, and she experiences swelling in her hands, wrists, ankles, and feet. She also reports neck stiffness and headaches.  She has a history of borderline Lyme disease, which was around the time her joint pain started to change and become a persistent issue. She has a family history of rheumatoid arthritis in her grandmother and osteoarthritis in her mother.   She experiences sun sensitivity, resulting in a red, raised rash on her chest and face, and reports a history of raised rashes on her arms and shoulders that come and go, sometimes leaving scars. She reports new symptoms of dry mouth and thinning hair over the past year and a half, with a family history of thin hair on her paternal grandmother's side. No sores in her mouth or patchy hair  loss. She denies raynaud's symptoms.  Her current medications include Celebrex , which she finds helpful, and she occasionally takes naproxen for additional pain relief. She has a history of asthma diagnosed in her late twenties and is currently on medication for her tachycardia.    Activities of Daily Living:  Patient reports morning stiffness for 30 minutes.   Patient Reports nocturnal pain.  Difficulty dressing/grooming: Denies Difficulty climbing stairs: Reports Difficulty getting out of chair: Reports Difficulty using hands for taps, buttons, cutlery, and/or writing: Reports  Review of Systems  Constitutional:  Positive for fatigue.  HENT:  Positive for mouth dryness. Negative for mouth sores.   Eyes:  Positive for dryness.  Respiratory:  Positive for shortness of breath.   Cardiovascular:  Positive for chest pain and palpitations.  Gastrointestinal:  Negative for blood in stool, constipation and diarrhea.  Endocrine: Negative for increased urination.  Genitourinary:  Negative for involuntary urination.  Musculoskeletal:  Positive for joint pain, gait problem, joint pain, joint swelling, myalgias, muscle weakness, morning stiffness, muscle tenderness and myalgias.  Skin:  Positive for rash, hair loss and sensitivity to sunlight. Negative for color change.  Allergic/Immunologic: Negative for susceptible to infections.  Neurological:  Positive for dizziness and headaches.  Hematological:  Positive for swollen glands.  Psychiatric/Behavioral:  Positive for sleep disturbance. Negative for depressed mood. The patient is not nervous/anxious.     PMFS History:  Patient Active  Problem List   Diagnosis Date Noted   Inflammatory arthritis 03/11/2024   Arthritis of both hands 03/11/2024   Diabetes mellitus (HCC) 08/23/2022   Hypertension associated with diabetes (HCC) 08/23/2022   Severe sleep apnea 02/28/2022   Insomnia 02/28/2022   Need for vaccination 11/26/2021   PHN  (postherpetic neuralgia) 11/13/2021   Nasal turbinate hypertrophy 04/17/2021   Primary hypertension 12/05/2020   Morbid obesity with BMI of 40.0-44.9, adult (HCC) 12/05/2020   Encounter for general adult medical examination with abnormal findings 12/05/2020   LVH (left ventricular hypertrophy) due to hypertensive disease, without heart failure 12/05/2020   Visit for screening mammogram 12/05/2020   Iron deficiency 12/22/2015   Vitamin D  deficiency 12/22/2015   Gastroparesis 12/20/2015   IBS (irritable bowel syndrome) 12/20/2015   GERD (gastroesophageal reflux disease) 12/20/2015    Past Medical History:  Diagnosis Date   Asthma    only uses maintanence inhaler   Gastroparesis    GERD (gastroesophageal reflux disease)    takes meds   History of MRSA infection    IBS (irritable bowel syndrome)    Migraine    PONV (postoperative nausea and vomiting)     Family History  Problem Relation Age of Onset   Anxiety disorder Mother    Depression Mother    Heart Problems Mother    Hypertension Mother    Diabetes Mother    AVM Mother    Stroke Mother    Atrial fibrillation Mother    Heart attack Mother    Fibromyalgia Mother    Breast cancer Mother 65       recur @ 72   Congestive Heart Failure Mother    Hypertension Father    Diabetes Father    Obesity Father    Heart attack Father    High Cholesterol Father    Depression Sister    Pancreatitis Sister    Diabetes Sister    Hypertension Sister    GER disease Sister    High Cholesterol Sister    Migraines Sister    Rheum arthritis Maternal Grandmother    Dementia Maternal Grandmother    Hypertension Maternal Grandmother    Hyperthyroidism Maternal Grandmother    Throat cancer Maternal Grandfather    Heart attack Paternal Grandmother    Pancreatic cancer Maternal Aunt    Pancreatic cancer Maternal Uncle    Asthma Daughter    Past Surgical History:  Procedure Laterality Date   GALLBLADDER SURGERY  2007   NASAL  SEPTOPLASTY W/ TURBINOPLASTY Bilateral 07/11/2021   Procedure: NASAL SEPTOPLASTY WITH TURBINATE REDUCTION;  Surgeon: Mable Lenis, MD;  Location: [facility];  Service: ENT;  Laterality: Bilateral;   Social History   Social History Narrative   Not on file   Immunization History  Administered Date(s) Administered   Fluad Trivalent(High Dose 65+) 02/18/2024   Hepatitis B 10/12/2006   Influenza Split 02/24/2022   Influenza-Unspecified 02/10/2013, 03/03/2021, 02/24/2022   MMR 05/13/1981   PFIZER(Purple Top)SARS-COV-2 Vaccination 06/01/2019, 06/24/2019, 03/24/2020   PNEUMOCOCCAL CONJUGATE-20 11/26/2021, 08/22/2022   Tdap 05/13/2005, 01/21/2017     Objective: Vital Signs: BP 123/86 (BP Location: Right Arm, Patient Position: Sitting, Cuff Size: Large)   Pulse 99   Temp 98.4 F (36.9 C)   Resp 14   Ht 5' 5.5 (1.664 m)   Wt (!) 323 lb (146.5 kg)   LMP  (LMP Unknown)   BMI 52.93 kg/m    Physical Exam Vitals and nursing note reviewed.  HENT:  Head: Normocephalic and atraumatic.     Nose: Nose normal.  Eyes:     Conjunctiva/sclera: Conjunctivae normal.     Pupils: Pupils are equal, round, and reactive to light.  Cardiovascular:     Rate and Rhythm: Normal rate and regular rhythm.     Heart sounds: Normal heart sounds.  Pulmonary:     Effort: Pulmonary effort is normal.     Breath sounds: Normal breath sounds.  Skin:    General: Skin is warm and dry.  Neurological:     Mental Status: She is alert. Mental status is at baseline.  Psychiatric:        Mood and Affect: Mood normal.        Behavior: Behavior normal.      Musculoskeletal Exam:   CDAI Exam: CDAI Score: -- Patient Global: --; Provider Global: -- Swollen: 0 ; Tender: 2  Joint Exam 03/24/2024      Right  Left  Knee   Tender   Tender     Investigation: No additional findings.  Imaging: DG Knee 1-2 Views Right Result Date: 03/27/2024 CLINICAL DATA:  Bilateral knee pain. EXAM: DG  KNEE 1-2V*R* COMPARISON:  None Available. FINDINGS: No evidence of fracture, dislocation, or joint effusion. The alignment and joint spaces are normal. No evidence of arthropathy or other focal bone abnormality. Soft tissues are unremarkable. IMPRESSION: Negative radiographs of the right knee. Electronically Signed   By: Andrea Gasman M.D.   On: 03/27/2024 10:28   DG Knee 1-2 Views Left Result Date: 03/27/2024 CLINICAL DATA:  Bilateral knee pain. EXAM: LEFT KNEE - 1-2 VIEW COMPARISON:  None Available. FINDINGS: No evidence of fracture, dislocation, or joint effusion. The alignment and joint spaces are normal. No evidence of arthropathy or other focal bone abnormality. Soft tissues are unremarkable. IMPRESSION: Negative radiographs of the left knee. Electronically Signed   By: Andrea Gasman M.D.   On: 03/27/2024 10:27   DG Hand Complete Right Result Date: 03/11/2024 EXAM: 3 OR MORE VIEW(S) XRAY OF THE RIGHT HAND 03/11/2024 03:17:28 PM COMPARISON: None available. CLINICAL HISTORY: pain pain FINDINGS: BONES AND JOINTS: No acute fracture. No focal osseous lesion. No joint dislocation. Mild joint space narrowing involving the 2nd through 5th DIP joints, moderate at the 5th DIP joint. Minimal joint space narrowing at the 3rd, 4th, and 5th PIP joints. No frank erosions. SOFT TISSUES: The soft tissues are unremarkable. IMPRESSION: 1. Arthritis involving the dip and pip joints as above, worse at the fifth dip joint. Electronically signed by: Luke Bun MD 03/11/2024 03:37 PM EDT RP Workstation: [id number]   DG Hand Complete Left Result Date: 03/11/2024 EXAM: 3 OR MORE VIEW(S) XRAY OF THE LEFT HAND 03/11/2024 03:17:28 PM COMPARISON: None available. CLINICAL HISTORY: pain pain FINDINGS: BONES AND JOINTS: No acute fracture. No focal osseous lesion. No joint dislocation. Mild joint space narrowing involving the fourth and fifth PIP joints. Diffuse joint space narrowing involving the second through fourth DIP  joints, worst at the fifth DIP joint. SOFT TISSUES: The soft tissues are unremarkable. IMPRESSION: 1. Arthritis involving the dip and pip joints as above, worst at the fifth dip joint. Electronically signed by: Luke Bun MD 03/11/2024 03:35 PM EDT RP Workstation: [id number]    Recent Labs: Lab Results  Component Value Date   WBC 11.8 (H) 03/11/2024   HGB 12.7 03/11/2024   PLT 310.0 03/11/2024   NA 137 03/11/2024   K 4.0 03/11/2024   CL 104 03/11/2024   CO2 23 03/11/2024  GLUCOSE 126 (H) 03/11/2024   BUN 14 03/11/2024   CREATININE 0.94 03/11/2024   BILITOT 0.3 03/11/2024   ALKPHOS 81 03/11/2024   AST 22 03/11/2024   ALT 32 03/11/2024   PROT 7.5 03/11/2024   ALBUMIN 4.2 03/11/2024   CALCIUM 9.7 03/24/2024   GFRAA  05/04/2009    >60        The eGFR has been calculated using the MDRD equation. This calculation has not been validated in all clinical situations. eGFR's persistently <60 mL/min signify possible Chronic Kidney Disease.   Lab Results  Component Value Date   ANA NEGATIVE 08/21/2023   RF <10 08/21/2023   XR Hand B/L: I personally reviewed and interpreted the images. Based on my personal interpretation is moderate to severe joint space narrowing of PIPs and DIPs bilaterally w/ cytic changes b/l 5th DIP's. Question possible mild joint space narrowing b/l 2nd-3rd MCP's.  XR Knees B/L: I personally reviewed and interpreted the images. Based on my personal interpretation is mild medial joint space narrowing  Speciality Comments: No specialty comments available.  Procedures:  No procedures performed Allergies: Bactrim [sulfamethoxazole-trimethoprim], Biaxin [clarithromycin], Doxycycline, Effexor [venlafaxine], Erythromycin, Imitrex [sumatriptan], Keflex [cephalexin], and Minocycline   Assessment / Plan:     Visit Diagnoses: Osteoarthritis, unspecified osteoarthritis type, unspecified site - Plan: DG Knee 1-2 Views Left, DG Knee 1-2 Views Right, PTH, intact and  calcium, Magnesium  Polyarthralgia - Plan: Uric acid, Sedimentation rate, PTH, intact and calcium, Magnesium  Pain in both knees, unspecified chronicity - Plan: DG Knee 1-2 Views Left, DG Knee 1-2 Views Right  Polyarthralgia   Orders: Orders Placed This Encounter  Procedures   DG Knee 1-2 Views Left   DG Knee 1-2 Views Right   Uric acid   Sedimentation rate   PTH, intact and calcium   Magnesium   Meds ordered this encounter  Medications   celecoxib  (CELEBREX ) 200 MG capsule    Sig: Take 1 capsule (200 mg total) by mouth every 12 (twelve) hours as needed for moderate pain (pain score 4-6).    Dispense:  180 capsule    Refill:  0   Assessment and Plan Assessment & Plan Polyarthralgia Osteoarthritis Seronegative rheumatoid arthritis? Patient with hx of joint pain that is likely multifactorial in setting of OA and possible inflammatory component (prolonged AM stiffness, improvement with use). XR of hands demonstrate significant OA, especially of DIPs and PIPs, with cystic changes of b/l 5th DIP's. However, as discussed with patient, there may be some mild inflammatory component given her presentation and mild joint space narrowing of 2nd-3rd MCP's on XR.  Will check for secondary causes of early OA in a young patient. However, given possible inflammatory component, did discuss initiate HCQ 400mg  daily for a 6 month trial after results return. Hydroxychloroquine typically is very well tolerated. The most common side effects are nausea and diarrhea, which often improve with time. Less common side effects include rash, hair changes, and muscle weakness. Rarely, hydroxychloroquine can lead to a pigmented retinopathy, cardiomyopathy, myopathy, arrhythmias due to prolonged QTc.  Discussed with patient the need for annual eye exams. Discussed recommendation is for a baseline eye exam within the first year of use, then annually after 5 years of starting HCQ.   Patient was given handout on  Plaquenil and encouraged to ask questions.   Patient also given prescription for Celebrex  at this time. Discussed risks of NSAID use including but not limited to kidney injury, elevation in liver enzymes, GI side effects.  Advised not to combine with steroids or other NSAIDs, such as Aleve or Ibuprofen . Patient verbalizes understanding.   I personally spent a total of 60 minutes in the care of the patient today including preparing to see the patient, getting/reviewing separately obtained history, performing a medically appropriate exam/evaluation, counseling and educating, placing orders, documenting clinical information in the EHR, and independently interpreting results.  Follow-Up Instructions: Return in about 3 months (around 06/24/2024).   Asberry Claw, DO

## 2024-03-24 ENCOUNTER — Ambulatory Visit

## 2024-03-24 ENCOUNTER — Other Ambulatory Visit: Payer: Self-pay

## 2024-03-24 ENCOUNTER — Other Ambulatory Visit (HOSPITAL_COMMUNITY): Payer: Self-pay

## 2024-03-24 VITALS — BP 123/86 | HR 99 | Temp 98.4°F | Resp 14 | Ht 65.5 in | Wt 323.0 lb

## 2024-03-24 DIAGNOSIS — M25562 Pain in left knee: Secondary | ICD-10-CM

## 2024-03-24 DIAGNOSIS — M25561 Pain in right knee: Secondary | ICD-10-CM

## 2024-03-24 DIAGNOSIS — M1990 Unspecified osteoarthritis, unspecified site: Secondary | ICD-10-CM | POA: Diagnosis not present

## 2024-03-24 DIAGNOSIS — M2550 Pain in unspecified joint: Secondary | ICD-10-CM

## 2024-03-24 MED ORDER — CELECOXIB 200 MG PO CAPS
200.0000 mg | ORAL_CAPSULE | Freq: Two times a day (BID) | ORAL | 0 refills | Status: AC | PRN
  Filled 2024-03-24: qty 180, 90d supply, fill #0

## 2024-03-25 ENCOUNTER — Other Ambulatory Visit: Payer: Self-pay

## 2024-03-25 ENCOUNTER — Other Ambulatory Visit (HOSPITAL_COMMUNITY): Payer: Self-pay

## 2024-03-25 ENCOUNTER — Encounter: Payer: Self-pay | Admitting: Pharmacist

## 2024-03-25 LAB — PTH, INTACT AND CALCIUM
Calcium: 9.7 mg/dL (ref 8.6–10.2)
PTH: 41 pg/mL (ref 16–77)

## 2024-03-25 LAB — MAGNESIUM: Magnesium: 2.2 mg/dL (ref 1.5–2.5)

## 2024-03-25 LAB — SEDIMENTATION RATE: Sed Rate: 53 mm/h — ABNORMAL HIGH (ref 0–20)

## 2024-03-25 LAB — URIC ACID: Uric Acid, Serum: 5 mg/dL (ref 2.5–7.0)

## 2024-03-26 ENCOUNTER — Ambulatory Visit
Admission: RE | Admit: 2024-03-26 | Discharge: 2024-03-26 | Disposition: A | Discharge status: Home / Self Care | Source: Ambulatory Visit

## 2024-03-26 DIAGNOSIS — M1990 Unspecified osteoarthritis, unspecified site: Secondary | ICD-10-CM

## 2024-03-26 DIAGNOSIS — M25562 Pain in left knee: Secondary | ICD-10-CM | POA: Diagnosis not present

## 2024-03-26 DIAGNOSIS — M25561 Pain in right knee: Secondary | ICD-10-CM | POA: Diagnosis not present

## 2024-03-31 ENCOUNTER — Telehealth: Admitting: Physician Assistant

## 2024-03-31 DIAGNOSIS — B029 Zoster without complications: Secondary | ICD-10-CM | POA: Diagnosis not present

## 2024-04-01 MED ORDER — VALACYCLOVIR HCL 1 G PO TABS: 1000.0000 mg | ORAL_TABLET | Freq: Three times a day (TID) | ORAL | 0 refills | Status: AC

## 2024-04-01 MED ORDER — ONDANSETRON 4 MG PO TBDP: 4.0000 mg | ORAL_TABLET | Freq: Three times a day (TID) | ORAL | 0 refills | Status: AC | PRN

## 2024-04-01 NOTE — Progress Notes (Signed)
 E-visit for Shingles   We are sorry that you are not feeling well. Here is how we plan to help!  Based on what you shared with me it looks like you have shingles.  Shingles or herpes zoster, is a common infection of the nerves.  It is a painful rash caused by the herpes zoster virus.  This is the same virus that causes chickenpox.  After a person has chickenpox, the virus remains inactive in the nerve cells.  Years later, the virus can become active again and travel to the skin.  It typically will appear on one side of the face or body.  Burning or shooting pain, tingling, or itching are early signs of the infection.  Blisters typically scab over in 7 to 10 days and clear up within 2-4 weeks. Shingles is only contagious to people that have never had the chickenpox, the chickenpox vaccine, or anyone who has a compromised immune system.  You should avoid contact with these type of people until your blisters scab over.  I have prescribed Valacyclovir  1g three times daily for 7 days. I have also sent in Zofran  to help with nausea if needed.   HOME CARE: Apply ice packs (wrapped in a thin towel), cool compresses, or soak in cool bath to help reduce pain. Use calamine lotion to calm itchy skin. Avoid scratching the rash. Avoid direct sunlight.  GET HELP RIGHT AWAY IF: Symptoms that don't away after treatment. A rash or blisters near your eye. Increased drainage, fever, or rash after treatment. Severe pain that doesn't go away.   MAKE SURE YOU   Understand these instructions. Will watch your condition. Will get help right away if you are not doing well or get worse.  Thank you for choosing an e-visit. Your e-visit answers were reviewed by a board certified advanced clinical practitioner to complete your personal care plan. Depending upon the condition, your plan could have included both over the counter or prescription medications.  Please review your pharmacy choice. Make sure the pharmacy  is open so you can pick up prescription now. If there is a problem, you may contact your provider through [employer] and have the prescription routed to another pharmacy.  Your safety is important to us . If you have drug allergies check your prescription carefully.   For the next 24 hours you can use MyChart to ask questions about today's visit, request a non-urgent call back, or ask for a work or school excuse.  You will get an email in the next two days asking about your experience. I hope that your e-visit has been valuable and will speed your recovery  I have spent 5 minutes in review of e-visit questionnaire, review and updating patient chart, medical decision making and response to patient.   Elsie Velma Lunger, PA-C

## 2024-04-02 ENCOUNTER — Other Ambulatory Visit: Payer: Self-pay | Admitting: Internal Medicine

## 2024-04-02 ENCOUNTER — Other Ambulatory Visit: Payer: Self-pay

## 2024-04-02 ENCOUNTER — Other Ambulatory Visit (HOSPITAL_COMMUNITY): Payer: Self-pay

## 2024-04-02 DIAGNOSIS — E1169 Type 2 diabetes mellitus with other specified complication: Secondary | ICD-10-CM

## 2024-04-02 MED ORDER — HYDROXYCHLOROQUINE SULFATE 200 MG PO TABS
200.0000 mg | ORAL_TABLET | Freq: Two times a day (BID) | ORAL | 0 refills | Status: AC
  Filled 2024-04-02: qty 180, 90d supply, fill #0

## 2024-04-03 ENCOUNTER — Telehealth: Admitting: Family Medicine

## 2024-04-03 DIAGNOSIS — J019 Acute sinusitis, unspecified: Secondary | ICD-10-CM

## 2024-04-03 NOTE — Progress Notes (Signed)
   Thank you for the details you included in the comment boxes. Those details are very helpful in determining the best course of treatment for you and help us  to provide the best care.Because of your symptoms and multiple allergies, we recommend that you schedule a Virtual Urgent Care video visit in order for the provider to better assess what is going on.  The provider will be able to give you a more accurate diagnosis and treatment plan if we can more freely discuss your symptoms and with the addition of a virtual examination.   If you change your visit to a video visit, we will bill your insurance (similar to an office visit) and you will not be charged for this e-Visit. You will be able to stay at home and speak with the first available [facility] advanced practice provider. The link to do a video visit is in the drop down Menu tab of your Welcome screen in MyChart.    I have spent 5 minutes in review of e-visit questionnaire, review and updating patient chart, medical decision making and response to patient.   Roosvelt Mater, PA-C

## 2024-04-04 ENCOUNTER — Encounter (HOSPITAL_BASED_OUTPATIENT_CLINIC_OR_DEPARTMENT_OTHER): Payer: Self-pay

## 2024-04-04 ENCOUNTER — Ambulatory Visit (HOSPITAL_BASED_OUTPATIENT_CLINIC_OR_DEPARTMENT_OTHER)
Admission: RE | Admit: 2024-04-04 | Discharge: 2024-04-04 | Disposition: A | Discharge status: Home / Self Care | Attending: Family Medicine | Admitting: Family Medicine

## 2024-04-04 VITALS — BP 147/90 | HR 90 | Temp 97.8°F | Resp 20

## 2024-04-04 DIAGNOSIS — R051 Acute cough: Secondary | ICD-10-CM | POA: Diagnosis not present

## 2024-04-04 DIAGNOSIS — J0101 Acute recurrent maxillary sinusitis: Secondary | ICD-10-CM | POA: Diagnosis not present

## 2024-04-04 MED ORDER — METHOCARBAMOL 500 MG PO TABS: 500.0000 mg | ORAL_TABLET | Freq: Two times a day (BID) | ORAL | 0 refills | Status: DC

## 2024-04-04 MED ORDER — CEFDINIR 300 MG PO CAPS: 300.0000 mg | ORAL_CAPSULE | Freq: Two times a day (BID) | ORAL | 0 refills | Status: AC

## 2024-04-04 MED ORDER — PREDNISONE 20 MG PO TABS: 40.0000 mg | ORAL_TABLET | Freq: Every day | ORAL | 0 refills | Status: AC

## 2024-04-04 MED ORDER — ONDANSETRON 4 MG PO TBDP: 4.0000 mg | ORAL_TABLET | Freq: Three times a day (TID) | ORAL | 0 refills | Status: AC | PRN

## 2024-04-04 MED ORDER — PROMETHAZINE-DM 6.25-15 MG/5ML PO SYRP: 5.0000 mL | ORAL_SOLUTION | Freq: Four times a day (QID) | ORAL | 0 refills | Status: AC | PRN

## 2024-04-04 NOTE — Discharge Instructions (Signed)
 Treating you for sinus infection upper respiratory infection.  Medications as prescribed.  Follow-up as needed

## 2024-04-04 NOTE — ED Triage Notes (Signed)
 Onset 2 weeks ago of what patient thought was allergies. Sinus pressure, runny nose, headache, and sneezing. States symptoms persisting with now raspy voice, sinus drainage and cough. Using OTC coricidin hbp. States chest congestion.

## 2024-04-04 NOTE — ED Provider Notes (Signed)
 [facility]    CSN: [id number] Arrival date & time: 04/04/24  0847      History   Chief Complaint Chief Complaint  Patient presents with   Nasal Congestion    Sinus infection - Entered by patient   Cough   Sinus pressure    HPI Megan Duran is a 43 y.o. female.   Pt is a 43 year old female that presents with nasal congestion. Started 2 weeks ago of what patient thought was allergies. Sinus pressure, runny nose, headache, and sneezing. States symptoms persisting with now raspy voice, sinus drainage and cough. Using OTC coricidin hbp. States chest congestion.    Cough   Past Medical History:  Diagnosis Date   Asthma    only uses maintanence inhaler   Gastroparesis    GERD (gastroesophageal reflux disease)    takes meds   History of MRSA infection    IBS (irritable bowel syndrome)    Migraine    PONV (postoperative nausea and vomiting)     Patient Active Problem List   Diagnosis Date Noted   Inflammatory arthritis 03/11/2024   Arthritis of both hands 03/11/2024   Diabetes mellitus (HCC) 08/23/2022   Hypertension associated with diabetes (HCC) 08/23/2022   Severe sleep apnea 02/28/2022   Insomnia 02/28/2022   Need for vaccination 11/26/2021   PHN (postherpetic neuralgia) 11/13/2021   Nasal turbinate hypertrophy 04/17/2021   Primary hypertension 12/05/2020   Morbid obesity with BMI of 40.0-44.9, adult (HCC) 12/05/2020   Encounter for general adult medical examination with abnormal findings 12/05/2020   LVH (left ventricular hypertrophy) due to hypertensive disease, without heart failure 12/05/2020   Visit for screening mammogram 12/05/2020   Iron deficiency 12/22/2015   Vitamin D  deficiency 12/22/2015   Gastroparesis 12/20/2015   IBS (irritable bowel syndrome) 12/20/2015   GERD (gastroesophageal reflux disease) 12/20/2015    Past Surgical History:  Procedure Laterality Date   GALLBLADDER SURGERY  2007   NASAL SEPTOPLASTY W/ TURBINOPLASTY  Bilateral 07/11/2021   Procedure: NASAL SEPTOPLASTY WITH TURBINATE REDUCTION;  Surgeon: Mable Lenis, MD;  Location: [facility];  Service: ENT;  Laterality: Bilateral;    OB History     Gravida  2   Para  2   Term  2   Preterm      AB      Living  2      SAB      IAB      Ectopic      Multiple  0   Live Births  1            Home Medications    Prior to Admission medications   Medication Sig Start Date End Date Taking? Authorizing Provider  cefdinir  (OMNICEF ) 300 MG capsule Take 1 capsule (300 mg total) by mouth 2 (two) times daily for 7 days. 04/04/24 04/11/24 Yes Tere Mcconaughey A, FNP  methocarbamol  (ROBAXIN ) 500 MG tablet Take 1 tablet (500 mg total) by mouth 2 (two) times daily. 04/04/24  Yes Montgomery Favor A, FNP  ondansetron  (ZOFRAN -ODT) 4 MG disintegrating tablet Take 1 tablet (4 mg total) by mouth every 8 (eight) hours as needed for nausea or vomiting. 04/04/24  Yes Kathe Wirick A, FNP  predniSONE  (DELTASONE ) 20 MG tablet Take 2 tablets (40 mg total) by mouth daily with breakfast for 5 days. 04/04/24 04/09/24 Yes Damacio Weisgerber A, FNP  promethazine -dextromethorphan (PROMETHAZINE -DM) 6.25-15 MG/5ML syrup Take 5 mLs by mouth 4 (four) times daily as needed  for cough. 04/04/24  Yes Onna Nodal A, FNP  albuterol  (VENTOLIN  HFA) 108 (90 Base) MCG/ACT inhaler Inhale 2 puffs into the lungs every 4 (four) hours as needed for wheezing or shortness of breath. 06/13/23   Vonna Sharlet POUR, MD  baclofen  (LIORESAL ) 10 MG tablet Take 1 tablet (10 mg total) by mouth 3 (three) times daily. 02/29/24   Lavell Bari LABOR, FNP  budesonide -formoterol  (SYMBICORT ) 160-4.5 MCG/ACT inhaler Inhale 2 puffs into the lungs 2 (two) times daily. 10/13/23   Hope Almarie ORN, NP  celecoxib  (CELEBREX ) 100 MG capsule Take 1 capsule (100 mg total) by mouth daily. 02/17/24   Joshua Debby CROME, MD  celecoxib  (CELEBREX ) 200 MG capsule Take 1 capsule (200 mg total) by mouth every 12 (twelve) hours  as needed for moderate pain (pain score 4-6). 03/24/24   Szer, Asberry, DO  Cholecalciferol (VITAMIN D3) POWD Take by mouth. 04/17/21   [provider]  estradiol  (CLIMARA  - DOSED IN MG/24 HR) 0.0375 mg/24hr patch Place 1 patch (0.0375 mg total) onto the skin once a week. 09/10/23     fluticasone  (FLONASE ) 50 MCG/ACT nasal spray Place 2 sprays into both nostrils daily. 01/28/24   Vivienne Delon HERO, PA-C  hydroxychloroquine  (PLAQUENIL ) 200 MG tablet Take 1 tablet (200 mg total) by mouth 2 (two) times daily. 04/02/24   Szer, Asberry, DO  levonorgestrel (MIRENA, 52 MG,) 20 MCG/24HR IUD Mirena 20 mcg/24 hours (6 yrs) 52 mg intrauterine device  Take 1 device by intrauterine route.    [provider]  Multiple Vitamin (MULTIVITAMIN PO) Take by mouth.    [provider]  naproxen  (NAPROSYN ) 500 MG tablet Take 1 tablet (500 mg total) by mouth 2 (two) times daily with a meal. Patient not taking: Reported on 03/24/2024 02/29/24   Lavell Bari LABOR, FNP  nebivolol  (BYSTOLIC ) 10 MG tablet Take 1 tablet (10 mg total) by mouth daily. 02/17/24   Joshua Debby CROME, MD  omeprazole  (PRILOSEC) 40 MG capsule Take 1 capsule (40 mg total) by mouth 2 (two) times daily. 06/20/22   Kozlow, Camellia PARAS, MD  ondansetron  (ZOFRAN -ODT) 4 MG disintegrating tablet Take 1 tablet (4 mg total) by mouth every 8 (eight) hours as needed for nausea or vomiting. 04/01/24   Gladis Elsie BROCKS, PA-C  tirzepatide  (MOUNJARO ) 15 MG/0.5ML Pen Inject 15 mg into the skin once a week. 01/05/24   Joshua Debby CROME, MD  traZODone  (DESYREL ) 150 MG tablet Take 1 tablet (150 mg total) by mouth at bedtime. 01/30/24   Hope Almarie ORN, NP  valACYclovir  (VALTREX ) 1000 MG tablet Take 1 tablet (1,000 mg total) by mouth 3 (three) times daily for 7 days. 04/01/24 04/08/24  Gladis Elsie BROCKS, PA-C  VITAMIN D  PO Take by mouth daily.    [provider]    Family History Family History  Problem Relation Age of Onset   Anxiety disorder  Mother    Depression Mother    Heart Problems Mother    Hypertension Mother    Diabetes Mother    AVM Mother    Stroke Mother    Atrial fibrillation Mother    Heart attack Mother    Fibromyalgia Mother    Breast cancer Mother 59       recur @ 55   Congestive Heart Failure Mother    Hypertension Father    Diabetes Father    Obesity Father    Heart attack Father    High Cholesterol Father    Depression Sister  Pancreatitis Sister    Diabetes Sister    Hypertension Sister    GER disease Sister    High Cholesterol Sister    Migraines Sister    Rheum arthritis Maternal Grandmother    Dementia Maternal Grandmother    Hypertension Maternal Grandmother    Hyperthyroidism Maternal Grandmother    Throat cancer Maternal Grandfather    Heart attack Paternal Grandmother    Pancreatic cancer Maternal Aunt    Pancreatic cancer Maternal Uncle    Asthma Daughter     Social History Social History   Tobacco Use   Smoking status: Never    Passive exposure: Past   Smokeless tobacco: Never  Vaping Use   Vaping status: Never Used  Substance Use Topics   Alcohol use: No    Alcohol/week: 0.0 standard drinks of alcohol   Drug use: Never     Allergies   Bactrim [sulfamethoxazole-trimethoprim], Biaxin [clarithromycin], Doxycycline, Effexor [venlafaxine], Erythromycin, Imitrex [sumatriptan], Keflex [cephalexin], and Minocycline   Review of Systems Review of Systems  Respiratory:  Positive for cough.      Physical Exam Triage Vital Signs ED Triage Vitals  Encounter Vitals Group     BP 04/04/24 0859 (!) 147/90     Girls Systolic BP Percentile --      Girls Diastolic BP Percentile --      Boys Systolic BP Percentile --      Boys Diastolic BP Percentile --      Pulse Rate 04/04/24 0859 90     Resp 04/04/24 0859 20     Temp 04/04/24 0859 97.8 F (36.6 C)     Temp Source 04/04/24 0859 Oral     SpO2 04/04/24 0859 97 %     Weight --      Height --      Head Circumference  --      Peak Flow --      Pain Score 04/04/24 0901 6     Pain Loc --      Pain Education --      Exclude from Growth Chart --    No data found.  Updated Vital Signs BP (!) 147/90 (BP Location: Right Arm)   Pulse 90   Temp 97.8 F (36.6 C) (Oral)   Resp 20   LMP  (LMP Unknown)   SpO2 97%   Visual Acuity Right Eye Distance:   Left Eye Distance:   Bilateral Distance:    Right Eye Near:   Left Eye Near:    Bilateral Near:     Physical Exam Constitutional:      General: She is not in acute distress.    Appearance: Normal appearance. She is not ill-appearing, toxic-appearing or diaphoretic.  HENT:     Head: Normocephalic and atraumatic.     Right Ear: Tympanic membrane and ear canal normal.     Left Ear: Tympanic membrane and ear canal normal.     Nose: Congestion and rhinorrhea present.     Mouth/Throat:     Pharynx: Oropharynx is clear.  Eyes:     Conjunctiva/sclera: Conjunctivae normal.  Cardiovascular:     Rate and Rhythm: Normal rate and regular rhythm.     Pulses: Normal pulses.     Heart sounds: Normal heart sounds.  Pulmonary:     Effort: Pulmonary effort is normal.     Breath sounds: Normal breath sounds.  Skin:    General: Skin is warm and dry.  Neurological:     Mental Status:  She is alert.  Psychiatric:        Mood and Affect: Mood normal.      UC Treatments / Results  Labs (all labs ordered are listed, but only abnormal results are displayed) Labs Reviewed - No data to display  EKG   Radiology No results found.  Procedures Procedures (including critical care time)  Medications Ordered in UC Medications - No data to display  Initial Impression / Assessment and Plan / UC Course  I have reviewed the triage vital signs and the nursing notes.  Pertinent labs & imaging results that were available during my care of the patient were reviewed by me and considered in my medical decision making (see chart for details).     Acute sinusitis  with cough.  Treating for sinus and upper respiratory infection.  Medications as prescribed.  Follow-up as needed Final Clinical Impressions(s) / UC Diagnoses   Final diagnoses:  Acute recurrent maxillary sinusitis  Acute cough     Discharge Instructions      Treating you for sinus infection upper respiratory infection.  Medications as prescribed.  Follow-up as needed     ED Prescriptions     Medication Sig Dispense Auth. Provider   methocarbamol  (ROBAXIN ) 500 MG tablet Take 1 tablet (500 mg total) by mouth 2 (two) times daily. 20 tablet Perlie Stene A, FNP   ondansetron  (ZOFRAN -ODT) 4 MG disintegrating tablet Take 1 tablet (4 mg total) by mouth every 8 (eight) hours as needed for nausea or vomiting. 20 tablet Blaiden Werth A, FNP   cefdinir  (OMNICEF ) 300 MG capsule Take 1 capsule (300 mg total) by mouth 2 (two) times daily for 7 days. 14 capsule Tiearra Colwell A, FNP   predniSONE  (DELTASONE ) 20 MG tablet Take 2 tablets (40 mg total) by mouth daily with breakfast for 5 days. 10 tablet Rodman Recupero A, FNP   promethazine -dextromethorphan (PROMETHAZINE -DM) 6.25-15 MG/5ML syrup Take 5 mLs by mouth 4 (four) times daily as needed for cough. 118 mL Adah Corning A, FNP      PDMP not reviewed this encounter.   Adah Corning LABOR, FNP 04/04/24 [phone]

## 2024-04-05 ENCOUNTER — Other Ambulatory Visit: Payer: Self-pay

## 2024-04-05 ENCOUNTER — Other Ambulatory Visit (HOSPITAL_COMMUNITY): Payer: Self-pay

## 2024-04-05 MED ORDER — MOUNJARO 15 MG/0.5ML ~~LOC~~ SOAJ
15.0000 mg | SUBCUTANEOUS | 1 refills | Status: AC
  Filled 2024-04-05: qty 6, 84d supply, fill #0

## 2024-04-06 ENCOUNTER — Other Ambulatory Visit (HOSPITAL_COMMUNITY): Payer: Self-pay

## 2024-04-06 ENCOUNTER — Other Ambulatory Visit: Payer: Self-pay

## 2024-04-09 ENCOUNTER — Other Ambulatory Visit (HOSPITAL_COMMUNITY): Payer: Self-pay

## 2024-04-12 ENCOUNTER — Other Ambulatory Visit (HOSPITAL_COMMUNITY): Payer: Self-pay

## 2024-04-14 ENCOUNTER — Other Ambulatory Visit (HOSPITAL_COMMUNITY): Payer: Self-pay

## 2024-04-18 ENCOUNTER — Other Ambulatory Visit (HOSPITAL_COMMUNITY): Payer: Self-pay

## 2024-04-19 DIAGNOSIS — G4733 Obstructive sleep apnea (adult) (pediatric): Secondary | ICD-10-CM | POA: Diagnosis not present

## 2024-04-22 ENCOUNTER — Encounter: Payer: Self-pay | Admitting: Pharmacist

## 2024-04-22 ENCOUNTER — Other Ambulatory Visit: Payer: Self-pay

## 2024-04-22 ENCOUNTER — Other Ambulatory Visit (HOSPITAL_COMMUNITY): Payer: Self-pay

## 2024-05-01 ENCOUNTER — Telehealth: Admitting: Family Medicine

## 2024-05-01 DIAGNOSIS — J069 Acute upper respiratory infection, unspecified: Secondary | ICD-10-CM | POA: Diagnosis not present

## 2024-05-01 MED ORDER — LEVOFLOXACIN 500 MG PO TABS: 500.0000 mg | ORAL_TABLET | Freq: Every day | ORAL | 0 refills | Status: AC

## 2024-05-01 MED ORDER — BENZONATATE 200 MG PO CAPS: 200.0000 mg | ORAL_CAPSULE | Freq: Two times a day (BID) | ORAL | 0 refills | Status: AC | PRN

## 2024-05-01 NOTE — Progress Notes (Signed)
 We are sorry that you are not feeling well.  Here is how we plan to help!  Based on your presentation I believe you most likely have A cough due to bacteria.  When patients have a fever and a productive cough with a change in color or increased sputum production, we are concerned about bacterial bronchitis.  If left untreated it can progress to pneumonia.  If your symptoms do not improve with your treatment plan it is important that you contact your provider.   I have prescribed Levofloxacin  500 mg daily for 7 days    In addition you may use A prescription cough medication called Tessalon  Perles 100mg . You may take 1-2 capsules every 8 hours as needed for your cough.   From your responses in the eVisit questionnaire you describe inflammation in the upper respiratory tract which is causing a significant cough.  This is commonly called Bronchitis and has four common causes:   Allergies Viral Infections Acid Reflux Bacterial Infection Allergies, viruses and acid reflux are treated by controlling symptoms or eliminating the cause. An example might be a cough caused by taking certain blood pressure medications. You stop the cough by changing the medication. Another example might be a cough caused by acid reflux. Controlling the reflux helps control the cough.  USE OF BRONCHODILATOR (RESCUE) INHALERS: There is a risk from using your bronchodilator too frequently.  The risk is that over-reliance on a medication which only relaxes the muscles surrounding the breathing tubes can reduce the effectiveness of medications prescribed to reduce swelling and congestion of the tubes themselves.  Although you feel brief relief from the bronchodilator inhaler, your asthma may actually be worsening with the tubes becoming more swollen and filled with mucus.  This can delay other crucial treatments, such as oral steroid medications. If you need to use a bronchodilator inhaler daily, several times per day, you should  discuss this with your provider.  There are probably better treatments that could be used to keep your asthma under control.     HOME CARE Only take medications as instructed by your medical team. Complete the entire course of an antibiotic. Drink plenty of fluids and get plenty of rest. Avoid close contacts especially the very young and the elderly Cover your mouth if you cough or cough into your sleeve. Always remember to wash your hands A steam or ultrasonic humidifier can help congestion.   GET HELP RIGHT AWAY IF: You develop worsening fever. You become short of breath You cough up blood. Your symptoms persist after you have completed your treatment plan MAKE SURE YOU  Understand these instructions. Will watch your condition. Will get help right away if you are not doing well or get worse.  Your e-visit answers were reviewed by a board certified advanced clinical practitioner to complete your personal care plan.  Depending on the condition, your plan could have included both over the counter or prescription medications. If there is a problem please reply  once you have received a response from your provider. Your safety is important to us .  If you have drug allergies check your prescription carefully.    You can use MyChart to ask questions about todays visit, request a non-urgent call back, or ask for a work or school excuse for 24 hours related to this e-Visit. If it has been greater than 24 hours you will need to follow up with your provider, or enter a new e-Visit to address those concerns. You will get an  e-mail in the next two days asking about your experience.  I hope that your e-visit has been valuable and will speed your recovery. Thank you for using e-visits.   I have spent 5 minutes in review of e-visit questionnaire, review and updating patient chart, medical decision making and response to patient.   Roosvelt Mater, PA-C

## 2024-05-11 ENCOUNTER — Other Ambulatory Visit: Payer: Self-pay | Admitting: Internal Medicine

## 2024-05-11 DIAGNOSIS — Z1231 Encounter for screening mammogram for malignant neoplasm of breast: Secondary | ICD-10-CM

## 2024-05-14 ENCOUNTER — Other Ambulatory Visit (HOSPITAL_COMMUNITY): Payer: Self-pay

## 2024-05-17 ENCOUNTER — Other Ambulatory Visit: Payer: Self-pay | Admitting: Primary Care

## 2024-05-18 ENCOUNTER — Other Ambulatory Visit (HOSPITAL_COMMUNITY): Payer: Self-pay

## 2024-05-18 MED ORDER — TRAZODONE HCL 150 MG PO TABS
150.0000 mg | ORAL_TABLET | Freq: Every day | ORAL | 3 refills | Status: AC
  Filled 2024-05-18 – 2024-05-21 (×2): qty 30, 30d supply, fill #0
  Filled 2024-06-17: qty 30, 30d supply, fill #1

## 2024-05-21 ENCOUNTER — Other Ambulatory Visit: Payer: Self-pay

## 2024-05-21 ENCOUNTER — Other Ambulatory Visit (HOSPITAL_COMMUNITY): Payer: Self-pay

## 2024-06-01 ENCOUNTER — Ambulatory Visit
Admission: RE | Admit: 2024-06-01 | Discharge: 2024-06-01 | Disposition: A | Source: Ambulatory Visit | Attending: Internal Medicine | Admitting: Internal Medicine

## 2024-06-01 DIAGNOSIS — Z1231 Encounter for screening mammogram for malignant neoplasm of breast: Secondary | ICD-10-CM

## 2024-06-09 ENCOUNTER — Other Ambulatory Visit: Payer: Self-pay

## 2024-06-09 MED ORDER — TIZANIDINE HCL 4 MG PO TABS
4.0000 mg | ORAL_TABLET | Freq: Every day | ORAL | 0 refills | Status: AC
  Filled 2024-06-09: qty 7, 7d supply, fill #0

## 2024-06-10 ENCOUNTER — Other Ambulatory Visit (HOSPITAL_COMMUNITY): Payer: Self-pay

## 2024-06-16 NOTE — Addendum Note (Signed)
 Addended by: Eluzer Howdeshell P on: 06/16/2024 12:46 PM   Modules accepted: Orders

## 2024-06-16 NOTE — Telephone Encounter (Signed)
 Worked up a refill of Tizanidine  90 day supply. Please review and sign if patient will stay of the muscle relaxer.

## 2024-06-17 NOTE — Progress Notes (Unsigned)
 "  Office Visit Note  Patient: Megan Duran             Date of Birth: 1980/10/30           MRN: [id number]             PCP: Joshua Debby LITTIE, MD Referring: Joshua Debby LITTIE, MD Visit Date: 06/24/2024 Occupation: Data Unavailable  Subjective:  No chief complaint on file.   History of Present Illness: Megan Duran is a 44 y.o. female with osteoarthritis, polyarthralgia and pain in both knees who is presenting for a 3 month follow up. She was last seen on 03/24/2024 where labs and x-rays were obtained. Patient was initiated HCQ 400mg  daily for a 6 month trial after labs resulted. Patient was also given a prescription for Celebrex .     Activities of Daily Living:  Patient reports morning stiffness for *** {minute/hour:19697}.   Patient {ACTIONS;DENIES/REPORTS:21021675::Denies} nocturnal pain.  Difficulty dressing/grooming: {ACTIONS;DENIES/REPORTS:21021675::Denies} Difficulty climbing stairs: {ACTIONS;DENIES/REPORTS:21021675::Denies} Difficulty getting out of chair: {ACTIONS;DENIES/REPORTS:21021675::Denies} Difficulty using hands for taps, buttons, cutlery, and/or writing: {ACTIONS;DENIES/REPORTS:21021675::Denies}  No Rheumatology ROS completed.   PMFS History:  Patient Active Problem List   Diagnosis Date Noted   Inflammatory arthritis 03/11/2024   Arthritis of both hands 03/11/2024   Diabetes mellitus (HCC) 08/23/2022   Hypertension associated with diabetes (HCC) 08/23/2022   Severe sleep apnea 02/28/2022   Insomnia 02/28/2022   Need for vaccination 11/26/2021   PHN (postherpetic neuralgia) 11/13/2021   Nasal turbinate hypertrophy 04/17/2021   Primary hypertension 12/05/2020   Morbid obesity with BMI of 40.0-44.9, adult (HCC) 12/05/2020   Encounter for general adult medical examination with abnormal findings 12/05/2020   LVH (left ventricular hypertrophy) due to hypertensive disease, without heart failure 12/05/2020   Visit for screening mammogram 12/05/2020    Iron deficiency 12/22/2015   Vitamin D  deficiency 12/22/2015   Gastroparesis 12/20/2015   IBS (irritable bowel syndrome) 12/20/2015   GERD (gastroesophageal reflux disease) 12/20/2015    Past Medical History:  Diagnosis Date   Asthma    only uses maintanence inhaler   Gastroparesis    GERD (gastroesophageal reflux disease)    takes meds   History of MRSA infection    IBS (irritable bowel syndrome)    Migraine    PONV (postoperative nausea and vomiting)     Family History  Problem Relation Age of Onset   Anxiety disorder Mother    Depression Mother    Heart Problems Mother    Hypertension Mother    Diabetes Mother    AVM Mother    Stroke Mother    Atrial fibrillation Mother    Heart attack Mother    Fibromyalgia Mother    Breast cancer Mother 33       recur @ 20   Congestive Heart Failure Mother    Hypertension Father    Diabetes Father    Obesity Father    Heart attack Father    High Cholesterol Father    Depression Sister    Pancreatitis Sister    Diabetes Sister    Hypertension Sister    GER disease Sister    High Cholesterol Sister    Migraines Sister    Rheum arthritis Maternal Grandmother    Dementia Maternal Grandmother    Hypertension Maternal Grandmother    Hyperthyroidism Maternal Grandmother    Throat cancer Maternal Grandfather    Heart attack Paternal Grandmother    Pancreatic cancer Maternal Aunt    Pancreatic cancer Maternal Uncle  Asthma Daughter    Past Surgical History:  Procedure Laterality Date   GALLBLADDER SURGERY  2007   NASAL SEPTOPLASTY W/ TURBINOPLASTY Bilateral 07/11/2021   Procedure: NASAL SEPTOPLASTY WITH TURBINATE REDUCTION;  Surgeon: Mable Lenis, MD;  Location: [facility];  Service: ENT;  Laterality: Bilateral;   Social History[1] Social History   Social History Narrative   Not on file     Immunization History  Administered Date(s) Administered   Fluad Trivalent(High Dose 65+) 02/18/2024    Hepatitis B 10/12/2006   Influenza Split 02/24/2022   Influenza-Unspecified 02/10/2013, 03/03/2021, 02/24/2022   MMR 05/13/1981   PFIZER(Purple Top)SARS-COV-2 Vaccination 06/01/2019, 06/24/2019, 03/24/2020   PNEUMOCOCCAL CONJUGATE-20 11/26/2021, 08/22/2022   Tdap 05/13/2005, 01/21/2017     Objective: Vital Signs: There were no vitals taken for this visit.   Physical Exam   Musculoskeletal Exam: ***  CDAI Exam: CDAI Score: -- Patient Global: --; Provider Global: -- Swollen: --; Tender: -- Joint Exam 06/24/2024   No joint exam has been documented for this visit   There is currently no information documented on the homunculus. Go to the Rheumatology activity and complete the homunculus joint exam.  Investigation: No additional findings.  Imaging: MM 3D SCREENING MAMMOGRAM BILATERAL BREAST Result Date: 06/11/2024 CLINICAL DATA:  Screening. EXAM: DIGITAL SCREENING BILATERAL MAMMOGRAM WITH TOMOSYNTHESIS AND CAD TECHNIQUE: Bilateral screening digital craniocaudal and mediolateral oblique mammograms were obtained. Bilateral screening digital breast tomosynthesis was performed. The images were evaluated with computer-aided detection. COMPARISON:  Previous exam(s). ACR Breast Density Category b: There are scattered areas of fibroglandular density. FINDINGS: There are no findings suspicious for malignancy. IMPRESSION: No mammographic evidence of malignancy. A result letter of this screening mammogram will be mailed directly to the patient. RECOMMENDATION: Screening mammogram in one year. (Code:SM-B-01Y) BI-RADS CATEGORY  1: Negative. Electronically Signed   By: Dina  Arceo M.D.   On: 06/11/2024 04:54    Recent Labs: Lab Results  Component Value Date   WBC 11.8 (H) 03/11/2024   HGB 12.7 03/11/2024   PLT 310.0 03/11/2024   NA 137 03/11/2024   K 4.0 03/11/2024   CL 104 03/11/2024   CO2 23 03/11/2024   GLUCOSE 126 (H) 03/11/2024   BUN 14 03/11/2024   CREATININE 0.94 03/11/2024    BILITOT 0.3 03/11/2024   ALKPHOS 81 03/11/2024   AST 22 03/11/2024   ALT 32 03/11/2024   PROT 7.5 03/11/2024   ALBUMIN 4.2 03/11/2024   CALCIUM 9.7 03/24/2024   GFRAA  05/04/2009    >60        The eGFR has been calculated using the MDRD equation. This calculation has not been validated in all clinical situations. eGFR's persistently <60 mL/min signify possible Chronic Kidney Disease.    Speciality Comments: No specialty comments available.  Procedures:  No procedures performed Allergies: Bactrim [sulfamethoxazole-trimethoprim], Biaxin [clarithromycin], Doxycycline, Effexor [venlafaxine], Erythromycin, Imitrex [sumatriptan], Keflex [cephalexin], and Minocycline   Assessment / Plan:     Visit Diagnoses: No diagnosis found.  Orders: No orders of the defined types were placed in this encounter.  No orders of the defined types were placed in this encounter.   Face-to-face time spent with patient was *** minutes. Greater than 50% of time was spent in counseling and coordination of care.  Follow-Up Instructions: No follow-ups on file.   Alfonso Patterson, LPN  Note - This record has been created using [employer].  Chart creation errors have been sought, but may not always  have been located. Such creation errors  do not reflect on  the standard of medical care.    [1]  Social History Tobacco Use   Smoking status: Never    Passive exposure: Past   Smokeless tobacco: Never  Vaping Use   Vaping status: Never Used  Substance Use Topics   Alcohol use: No    Alcohol/week: 0.0 standard drinks of alcohol   Drug use: Never   "

## 2024-06-18 ENCOUNTER — Other Ambulatory Visit (HOSPITAL_BASED_OUTPATIENT_CLINIC_OR_DEPARTMENT_OTHER): Payer: Self-pay

## 2024-06-18 ENCOUNTER — Other Ambulatory Visit (HOSPITAL_COMMUNITY): Payer: Self-pay

## 2024-06-22 ENCOUNTER — Ambulatory Visit: Payer: Commercial Managed Care - PPO | Admitting: Primary Care

## 2024-06-24 ENCOUNTER — Ambulatory Visit

## 2024-06-24 DIAGNOSIS — M25561 Pain in right knee: Secondary | ICD-10-CM

## 2024-06-24 DIAGNOSIS — M2550 Pain in unspecified joint: Secondary | ICD-10-CM

## 2024-06-24 DIAGNOSIS — M1990 Unspecified osteoarthritis, unspecified site: Secondary | ICD-10-CM

## 2024-09-09 ENCOUNTER — Ambulatory Visit: Admitting: Internal Medicine
